# Patient Record
Sex: Male | Born: 1945 | Race: White | Hispanic: No | Marital: Single | Smoking: Never smoker
Health system: Southern US, Community
[De-identification: ages and names within clinical notes are randomized; demographics above are authoritative.]

## PROBLEM LIST (undated history)

## (undated) DIAGNOSIS — J811 Chronic pulmonary edema: Secondary | ICD-10-CM

## (undated) DIAGNOSIS — E876 Hypokalemia: Secondary | ICD-10-CM

## (undated) DIAGNOSIS — Z86718 Personal history of other venous thrombosis and embolism: Secondary | ICD-10-CM

## (undated) DIAGNOSIS — S62102A Fracture of unspecified carpal bone, left wrist, initial encounter for closed fracture: Secondary | ICD-10-CM

## (undated) DIAGNOSIS — F1010 Alcohol abuse, uncomplicated: Secondary | ICD-10-CM

## (undated) DIAGNOSIS — J849 Interstitial pulmonary disease, unspecified: Secondary | ICD-10-CM

## (undated) HISTORY — PX: ANKLE FRACTURE SURGERY: SHX122

## (undated) HISTORY — PX: OTHER SURGICAL HISTORY: SHX169

---

## 2005-10-01 ENCOUNTER — Encounter: Admission: RE | Admit: 2005-10-01 | Discharge: 2005-10-01 | Payer: Self-pay | Admitting: Family Medicine

## 2010-06-25 ENCOUNTER — Encounter: Payer: Self-pay | Admitting: Family Medicine

## 2013-03-10 ENCOUNTER — Encounter (HOSPITAL_COMMUNITY): Payer: Self-pay

## 2013-03-10 ENCOUNTER — Emergency Department (HOSPITAL_COMMUNITY): Payer: Medicare Other

## 2013-03-10 ENCOUNTER — Inpatient Hospital Stay (HOSPITAL_COMMUNITY)
Admission: EM | Admit: 2013-03-10 | Discharge: 2013-03-14 | DRG: 299 | Disposition: A | Discharge status: Home Health | Payer: Medicare Other | Attending: Internal Medicine | Admitting: Internal Medicine

## 2013-03-10 DIAGNOSIS — I2699 Other pulmonary embolism without acute cor pulmonale: Secondary | ICD-10-CM

## 2013-03-10 DIAGNOSIS — O2230 Deep phlebothrombosis in pregnancy, unspecified trimester: Secondary | ICD-10-CM | POA: Insufficient documentation

## 2013-03-10 DIAGNOSIS — I2692 Saddle embolus of pulmonary artery without acute cor pulmonale: Secondary | ICD-10-CM | POA: Diagnosis present

## 2013-03-10 DIAGNOSIS — I824Y9 Acute embolism and thrombosis of unspecified deep veins of unspecified proximal lower extremity: Principal | ICD-10-CM | POA: Diagnosis present

## 2013-03-10 DIAGNOSIS — E876 Hypokalemia: Secondary | ICD-10-CM | POA: Diagnosis not present

## 2013-03-10 DIAGNOSIS — I82401 Acute embolism and thrombosis of unspecified deep veins of right lower extremity: Secondary | ICD-10-CM

## 2013-03-10 DIAGNOSIS — R0602 Shortness of breath: Secondary | ICD-10-CM

## 2013-03-10 DIAGNOSIS — Z86718 Personal history of other venous thrombosis and embolism: Secondary | ICD-10-CM

## 2013-03-10 HISTORY — DX: Personal history of other venous thrombosis and embolism: Z86.718

## 2013-03-10 LAB — POCT I-STAT TROPONIN I: Troponin i, poc: 0.01 ng/mL (ref 0.00–0.08)

## 2013-03-10 LAB — CBC
HCT: 51.2 % (ref 39.0–52.0)
Hemoglobin: 18.3 g/dL — ABNORMAL HIGH (ref 13.0–17.0)
MCV: 107.6 fL — ABNORMAL HIGH (ref 78.0–100.0)
RBC: 4.76 MIL/uL (ref 4.22–5.81)
RDW: 17.1 % — ABNORMAL HIGH (ref 11.5–15.5)

## 2013-03-10 LAB — PROTIME-INR: INR: 1.06 (ref 0.00–1.49)

## 2013-03-10 LAB — BASIC METABOLIC PANEL
Calcium: 9.6 mg/dL (ref 8.4–10.5)
Sodium: 139 mEq/L (ref 135–145)

## 2013-03-10 LAB — PRO B NATRIURETIC PEPTIDE: Pro B Natriuretic peptide (BNP): 730.5 pg/mL — ABNORMAL HIGH (ref 0–125)

## 2013-03-10 MED ORDER — WARFARIN VIDEO
Freq: Once | Status: AC
  Administered 2013-03-11: 11:00:00

## 2013-03-10 MED ORDER — ONDANSETRON HCL 4 MG PO TABS: 4.0000 mg | ORAL_TABLET | Freq: Four times a day (QID) | ORAL | Status: DC | PRN

## 2013-03-10 MED ORDER — HEPARIN (PORCINE) IN NACL 100-0.45 UNIT/ML-% IJ SOLN
1250.0000 [IU]/h | INTRAMUSCULAR | Status: DC
  Administered 2013-03-10: 1250 [IU]/h via INTRAVENOUS
  Filled 2013-03-10: qty 250

## 2013-03-10 MED ORDER — HEPARIN BOLUS VIA INFUSION
4000.0000 [IU] | Freq: Once | INTRAVENOUS | Status: AC
  Administered 2013-03-10: 4000 [IU] via INTRAVENOUS
  Filled 2013-03-10: qty 4000

## 2013-03-10 MED ORDER — ACETAMINOPHEN 325 MG PO TABS: 650.0000 mg | ORAL_TABLET | Freq: Four times a day (QID) | ORAL | Status: DC | PRN

## 2013-03-10 MED ORDER — ACETAMINOPHEN 650 MG RE SUPP: 650.0000 mg | Freq: Four times a day (QID) | RECTAL | Status: DC | PRN

## 2013-03-10 MED ORDER — IOHEXOL 350 MG/ML SOLN
100.0000 mL | Freq: Once | INTRAVENOUS | Status: AC | PRN
  Administered 2013-03-10: 100 mL via INTRAVENOUS

## 2013-03-10 MED ORDER — WARFARIN SODIUM 7.5 MG PO TABS
7.5000 mg | ORAL_TABLET | Freq: Once | ORAL | Status: AC
  Administered 2013-03-10: 7.5 mg via ORAL
  Filled 2013-03-10: qty 1

## 2013-03-10 MED ORDER — SODIUM CHLORIDE 0.9 % IJ SOLN
3.0000 mL | Freq: Two times a day (BID) | INTRAMUSCULAR | Status: DC
  Administered 2013-03-13 – 2013-03-14 (×2): 3 mL via INTRAVENOUS

## 2013-03-10 MED ORDER — ONDANSETRON HCL 4 MG/2ML IJ SOLN: 4.0000 mg | Freq: Four times a day (QID) | INTRAMUSCULAR | Status: DC | PRN

## 2013-03-10 MED ORDER — MORPHINE SULFATE 2 MG/ML IJ SOLN: 2.0000 mg | INTRAMUSCULAR | Status: DC | PRN

## 2013-03-10 MED ORDER — COUMADIN BOOK
Freq: Once | Status: AC
  Administered 2013-03-10: 23:00:00
  Filled 2013-03-10: qty 1

## 2013-03-10 MED ORDER — WARFARIN - PHARMACIST DOSING INPATIENT: Freq: Every day | Status: DC

## 2013-03-10 NOTE — ED Notes (Signed)
Pt reports shortness of breath with increased activity x 4 days. Denies chest pain

## 2013-03-10 NOTE — ED Provider Notes (Signed)
Medical screening examination/treatment/procedure(s) were performed by non-physician practitioner and as supervising physician I was immediately available for consultation/collaboration.  Sean Whitworth T Aleathea Pugmire, MD 03/10/13 2330 

## 2013-03-10 NOTE — H&P (Signed)
[facility] History and Physical  Sean Jackson ZOX:[id number] DOB: 08-31-1945 DOA: 03/10/2013  Referring physician: Emergency department PCP: No primary provider on file.  Specialists:   Chief Complaint: SOB  HPI: Sean Jackson is a 66 y.o. male  With no significant past medical history who presents to the ED with complaints of several days of gradually worsening SOB and RLE swelling. In the Ed, the patient was found to have a RLE DVT with a large saddle PE with evidence of R heart strain. The patient was started on therapeutic anticoagulation and the hospitalist was consulted for admission. No recent surgeries. No recent travel. On further questioning, pt states he "pulled my muscle" in his leg and was therefore chairbound for the past month. Also reports a prior hx of LLE DVT about 5 years ago.  Review of Systems:  Per above. Remainder of 10pt ros reviewed and are neg  History reviewed. No pertinent past medical history. History reviewed. No pertinent past surgical history. Social History:  reports that he has never smoked. He does not have any smokeless tobacco history on file. He reports that  drinks alcohol. He reports that he does not use illicit drugs.  where does patient live--home, ALF, SNF? and with whom if at home?  Can patient participate in ADLs?  No Known Allergies  History reviewed. No pertinent family history.  (be sure to complete)  Prior to Admission medications   Medication Sig Start Date End Date Taking? Authorizing Provider  Multiple Vitamin (MULTIVITAMIN WITH MINERALS) TABS tablet Take 1 tablet by mouth daily.   Yes Historical Provider, MD   Physical Exam: Filed Vitals:   03/10/13 1733 03/10/13 1904 03/10/13 1907 03/10/13 1926  BP: 135/82 141/77    Pulse: 118 94    Temp: 98.2 F (36.8 C)  98.6 F (37 C)   TempSrc:   Oral   Resp: 22 16 21    Height:   5\' 5"  (1.651 m) 5\' 5"  (1.651 m)  Weight:   72.576 kg (160 lb) 72.576 kg (160 lb)  SpO2: 96%  93% 91%      General:  Awake, in nad  Eyes: PERRL B  ENT: Membranes moist, dentition fair  Neck: trachea midline, neck supple  Cardiovascular: regular, s1, s2  Respiratory: normal resp effort, no wheezing  Abdomen: soft, nondistended  Skin: normal skin turgor, no abnormal skin lesions seen  Musculoskeletal:perfused, no clubbing  Psychiatric: mood/affect normal // no auditory/visual hallucinations  Neurologic: cn2-12 grossly intact, strength/sensation intact  Labs on Admission:  Basic Metabolic Panel:  Recent Labs Lab 03/10/13 1900  NA 139  K 3.5  CL 101  CO2 21  GLUCOSE 142*  BUN 5*  CREATININE 0.79  CALCIUM 9.6   Liver Function Tests: No results found for this basename: AST, ALT, ALKPHOS, BILITOT, PROT, ALBUMIN,  in the last 168 hours No results found for this basename: LIPASE, AMYLASE,  in the last 168 hours No results found for this basename: AMMONIA,  in the last 168 hours CBC:  Recent Labs Lab 03/10/13 1900  WBC 9.9  HGB 18.3*  HCT 51.2  MCV 107.6*  PLT 198   Cardiac Enzymes: No results found for this basename: CKTOTAL, CKMB, CKMBINDEX, TROPONINI,  in the last 168 hours  BNP (last 3 results)  Recent Labs  03/10/13 1900  PROBNP 730.5*   CBG: No results found for this basename: GLUCAP,  in the last 168 hours  Radiological Exams on Admission: Dg Chest 2 View  03/10/2013  CLINICAL DATA:  Shortness of breath.  EXAM: CHEST  2 VIEW  COMPARISON:  9  FINDINGS: Normal cardiac contours. Mild fullness of the right hilum. Elevation of the right hemidiaphragm. Right greater than left biapical emphysematous change. Minimal linear opacities within the left lower lung. No definite pleural effusion or pneumothorax. Regional skeleton is unremarkable.  IMPRESSION: 1. Minimal linear opacities within the left lung base may represent atelectasis, scarring or infection. Followup radiography is recommended to ensure resolution or stability as findings are of  uncertain chronicity. 2. Fullness of the right hilum likely secondary to prominent right pulmonary artery. Attention on short term followup radiography.   Electronically Signed   By: Annia Belt M.D.   On: 03/10/2013 18:11   Ct Angio Chest Pe W/cm &/or Wo Cm  03/10/2013   *RADIOLOGY REPORT*  Clinical Data: Shortness of breath, evaluate for pulmonary embolism  CT ANGIOGRAPHY CHEST  Technique:  Multidetector CT imaging of the chest using the standard protocol during bolus administration of intravenous contrast. Multiplanar reconstructed images including MIPs were obtained and reviewed to evaluate the vascular anatomy.  Contrast: OMNIPAQUE IOHEXOL 350 MG/ML SOLN  Comparison: Chest radiograph - earlier same day  Vascular Findings:  There is adequate opacification of the pulmonary arterial system with the main pulmonary artery measuring 406 HU.  There is a saddle embolism within the bifurcation of the main pulmonary artery extending into the bilateral main pulmonary arteries, right greater than left.  There is near occlusive embolism extending throughout the segmental branches of the right upper lobe.  There is scattered nonocclusive embolism within the right middle and lower segmental pulmonary arteries.  There is nonocclusive embolism within the left upper and lower lobes.  The overall clot burden is deemed large.  This finding is associated with bowing of the interventricular septum with enlargement of the right ventricle with associated right to left ventricular ratio of greater than 1.  Normal caliber of the main pulmonary artery.  There is no reflux of injected contrast into the intrahepatic venous system.  Normal heart size.  Coronary calcifications.  No pericardial effusion.  Normal caliber thoracic aorta.  No definite thoracic aortic dissection or periaortic stranding.  Conventional configuration of the aortic arch.  ---------------------------------------------------  Nonvascular findings:  There is  minimal dependent volume loss within the bilateral lower lobes, right greater than left.  There is a geographic approximately 1.1 x 0.8 cm subpleural nodular opacity within the superior segment left lower lobe (image 42, series seven).  There is geographic ground-glass atelectasis within the subpleural aspect of the right upper lobe (image 35, series seven).  No pleural effusion.  No pneumothorax.  The central pulmonary airways are patent.  No mediastinal, hilar or axillary lymphadenopathy.  Limited early arterial phase evaluation of the upper abdomen is negative.  No acute or aggressive osseous abnormalities.  Stigmata of DISH within the thoracic spine.  IMPRESSION: 1.  Examination is positive for large volume saddle pulmonary embolism with associated findings suggestive of right-sided heart strain indicative of at least submassive pulmonary embolism.  2.  Scattered bilateral subpleural ground-glass opacities with dominant approximately 1.1 cm slightly nodular appearing opacity within the superior segment of the left lower lobe. These findings are nonspecific finding in the setting of described pulmonary embolism, though as a discrete underlying pulmonary nodule is not excluded, a follow-up chest CT in 4 to 6 weeks after resolution of symptoms is recommended.  Critical Value/emergent results were called by telephone at the time of interpretation on  03/10/2013 at 21 14 to [place], [place], who verbally acknowledged these results.   Original Report Authenticated By: Tacey Ruiz, MD    Assessment/Plan Principal Problem:   Saddle pulmonary embolus Active Problems:   DVT (deep vein thrombosis) in pregnancy   1. Saddle PE/DVT 1. Will admit pt to stepdown 2. Will continue patient with therapeutic anticoagulation 3. Given hx of recurrent DVT, will also check hypercoag panel 4. Cont O2 as needed  Code Status: Full (must indicate code status--if unknown or must be presumed, indicate so) Family Communication: Pt  in room (indicate person spoken with, if applicable, with phone number if by telephone) Disposition Plan: Pending (indicate anticipated LOS)  Time spent:  Harmonii Karle K [facility] Pager [phone]  If 7PM-7AM, please contact night-coverage [email or web address] Password [facility] 03/10/2013, 8:56 PM

## 2013-03-10 NOTE — ED Notes (Signed)
Unsuccessfully attempted to obtain blood x2. RN made aware 

## 2013-03-10 NOTE — ED Provider Notes (Signed)
CSN: [id number]     Arrival date & time 03/10/13  1725 History   First MD Initiated Contact with Patient 03/10/13 1727     Chief Complaint  Patient presents with  . Shortness of Breath    4 day hx of shortness of breath with increased activity, not at rest   (Consider location/radiation/quality/duration/timing/severity/associated sxs/prior Treatment) HPI Comments: 67 year old male with no known past medical history presents to the emergency department complaining of shortness of breath x3-4 days. Patient states he has been feeling short of breath on exertion, even with light activity such as walking to the kitchen making a sandwich. No shortness of breath at rest. Denies any history of the same. States he has had a slight dry cough, however this is only present once every few hours. Denies chest pain, fever, chills, recent illness, nausea, vomiting, diaphoresis, abdominal pain. Also states his right lower leg has been swelling over the past 4 days and has associated pain with walking. He has not tried any alleviating factors for any of his symptoms. He does not take medications for anything a regular basis. He is a nonsmoker. No recent long travel.  Patient is a 67 y.o. male presenting with shortness of breath. The history is provided by the patient.  Shortness of Breath Associated symptoms: cough   Associated symptoms: no chest pain, no fever, no vomiting and no wheezing     No past medical history on file. No past surgical history on file. No family history on file. History  Substance Use Topics  . Smoking status: Not on file  . Smokeless tobacco: Not on file  . Alcohol Use: Not on file    Review of Systems  Constitutional: Negative for fever and chills.  HENT: Negative for congestion.   Respiratory: Positive for cough and shortness of breath. Negative for chest tightness and wheezing.   Cardiovascular: Positive for leg swelling (unilateral, right). Negative for chest pain.   Gastrointestinal: Negative for nausea and vomiting.  Genitourinary: Negative for urgency, frequency and difficulty urinating.  Musculoskeletal: Negative for back pain.  Skin: Negative for color change.  Neurological: Negative for dizziness, syncope, weakness and light-headedness.  Psychiatric/Behavioral: Negative for confusion.  All other systems reviewed and are negative.    Allergies  Review of patient's allergies indicates not on file.  Home Medications  No current outpatient prescriptions on file. BP 135/82  Pulse 118  Temp(Src) 98.2 F (36.8 C)  Resp 22  SpO2 96% Physical Exam  Nursing note and vitals reviewed. Constitutional: He is oriented to person, place, and time. He appears well-developed and well-nourished. No distress.  HENT:  Head: Normocephalic and atraumatic.  Mouth/Throat: Oropharynx is clear and moist.  Eyes: Conjunctivae and EOM are normal. Pupils are equal, round, and reactive to light.  Neck: Normal range of motion. Neck supple. No JVD present.  Cardiovascular: Regular rhythm, normal heart sounds and intact distal pulses.  Tachycardia present.   Non-pitting edema RLE, slightly larger than LLE. Tender posterior calf. Intact distal pulses.  Pulmonary/Chest: Breath sounds normal. Tachypnea noted. He has no decreased breath sounds. He has no wheezes. He has no rhonchi. He has no rales.  Labored breathing.  Musculoskeletal: Normal range of motion.  Neurological: He is alert and oriented to person, place, and time.  Skin: Skin is warm and dry. He is not diaphoretic.  Scaly flaky skin bilateral lower extremities.  Psychiatric: He has a normal mood and affect. His behavior is normal.    ED Course  Procedures (  including critical care time) Labs Review Labs Reviewed  CBC - Abnormal; Notable for the following:    Hemoglobin 18.3 (*)    MCV 107.6 (*)    MCH 38.4 (*)    RDW 17.1 (*)    All other components within normal limits  BASIC METABOLIC PANEL -  Abnormal; Notable for the following:    Glucose, Bld 142 (*)    BUN 5 (*)    All other components within normal limits  PRO B NATRIURETIC PEPTIDE - Abnormal; Notable for the following:    Pro B Natriuretic peptide (BNP) 730.5 (*)    All other components within normal limits  PROTIME-INR  HEPARIN LEVEL (UNFRACTIONATED)  CBC  POCT I-STAT TROPONIN I   Imaging Review Dg Chest 2 View  03/10/2013   CLINICAL DATA:  Shortness of breath.  EXAM: CHEST  2 VIEW  COMPARISON:  9  FINDINGS: Normal cardiac contours. Mild fullness of the right hilum. Elevation of the right hemidiaphragm. Right greater than left biapical emphysematous change. Minimal linear opacities within the left lower lung. No definite pleural effusion or pneumothorax. Regional skeleton is unremarkable.  IMPRESSION: 1. Minimal linear opacities within the left lung base may represent atelectasis, scarring or infection. Followup radiography is recommended to ensure resolution or stability as findings are of uncertain chronicity. 2. Fullness of the right hilum likely secondary to prominent right pulmonary artery. Attention on short term followup radiography.   Electronically Signed   By: Annia Belt M.D.   On: 03/10/2013 18:11   Ct Angio Chest Pe W/cm &/or Wo Cm  03/10/2013   *RADIOLOGY REPORT*  Clinical Data: Shortness of breath, evaluate for pulmonary embolism  CT ANGIOGRAPHY CHEST  Technique:  Multidetector CT imaging of the chest using the standard protocol during bolus administration of intravenous contrast. Multiplanar reconstructed images including MIPs were obtained and reviewed to evaluate the vascular anatomy.  Contrast: OMNIPAQUE IOHEXOL 350 MG/ML SOLN  Comparison: Chest radiograph - earlier same day  Vascular Findings:  There is adequate opacification of the pulmonary arterial system with the main pulmonary artery measuring 406 HU.  There is a saddle embolism within the bifurcation of the main pulmonary artery extending into the  bilateral main pulmonary arteries, right greater than left.  There is near occlusive embolism extending throughout the segmental branches of the right upper lobe.  There is scattered nonocclusive embolism within the right middle and lower segmental pulmonary arteries.  There is nonocclusive embolism within the left upper and lower lobes.  The overall clot burden is deemed large.  This finding is associated with bowing of the interventricular septum with enlargement of the right ventricle with associated right to left ventricular ratio of greater than 1.  Normal caliber of the main pulmonary artery.  There is no reflux of injected contrast into the intrahepatic venous system.  Normal heart size.  Coronary calcifications.  No pericardial effusion.  Normal caliber thoracic aorta.  No definite thoracic aortic dissection or periaortic stranding.  Conventional configuration of the aortic arch.  ---------------------------------------------------  Nonvascular findings:  There is minimal dependent volume loss within the bilateral lower lobes, right greater than left.  There is a geographic approximately 1.1 x 0.8 cm subpleural nodular opacity within the superior segment left lower lobe (image 42, series seven).  There is geographic ground-glass atelectasis within the subpleural aspect of the right upper lobe (image 35, series seven).  No pleural effusion.  No pneumothorax.  The central pulmonary airways are patent.  No mediastinal,  hilar or axillary lymphadenopathy.  Limited early arterial phase evaluation of the upper abdomen is negative.  No acute or aggressive osseous abnormalities.  Stigmata of DISH within the thoracic spine.  IMPRESSION: 1.  Examination is positive for large volume saddle pulmonary embolism with associated findings suggestive of right-sided heart strain indicative of at least submassive pulmonary embolism.  2.  Scattered bilateral subpleural ground-glass opacities with dominant approximately 1.1 cm  slightly nodular appearing opacity within the superior segment of the left lower lobe. These findings are nonspecific finding in the setting of described pulmonary embolism, though as a discrete underlying pulmonary nodule is not excluded, a follow-up chest CT in 4 to 6 weeks after resolution of symptoms is recommended.  Critical Value/emergent results were called by telephone at the time of interpretation on 03/10/2013 at 21 14 to [place], [place], who verbally acknowledged these results.   Original Report Authenticated By: Tacey Ruiz, MD    MDM   1. Pulmonary emboli   2. Saddle embolism of pulmonary artery     CRITICAL CARE Performed by: Johnnette Gourd   Total critical care time: 1 hour  Critical care time was exclusive of separately billable procedures and treating other patients.  Critical care was necessary to treat or prevent imminent or life-threatening deterioration.  Critical care was time spent personally by me on the following activities: development of treatment plan with patient and/or surrogate as well as nursing, discussions with consultants, evaluation of patient's response to treatment, examination of patient, obtaining history from patient or surrogate, ordering and performing treatments and interventions, ordering and review of laboratory studies, ordering and review of radiographic studies, pulse oximetry and re-evaluation of patient's condition.  Patient with shortness of breath on exertion, unilateral leg swelling. He appears in no apparent distress. He is tachycardic and tachypneic. O2 sat 96% on room air. Given unilateral leg swelling, tachycardia and age I cannot r/o PE, will obtain d-dimer, LE duplex US. Other labs/imaging pending- CBC, BMP, BNP, troponin, CXR, EKG. 6:36 PM Lower extremity duplex positive for DVT throughout right leg. At this time I will cancel d-dimer and obtain CT angio of chest to evaluate for PE. 8:52 PM CTA with results as shown above. I spoke with  Dr. Delford Field with critical care who states he feels patient is stable enough for stat down unit, admit to hospitalist. Patient is hemodynamically stable. Admission accepted by Dr. Rhona Leavens, [facility]. Heparin drip started. Patient also evaluated by my attending Dr. Freida Busman who agrees with plan of care.  Trevor Mace, PA-C 03/10/13 2054

## 2013-03-10 NOTE — Progress Notes (Addendum)
ANTICOAGULATION CONSULT NOTE - Initial Consult  Pharmacy Consult for Heparin/Warfarin Indication: DVT/PE  No Known Allergies  Patient Measurements: Height: 5\' 5"  (165.1 cm) Weight: 160 lb (72.576 kg) IBW/kg (Calculated) : 61.5 Heparin Dosing Weight: 72 kg  Vital Signs: Temp: 98.6 F (37 C) (10/07 1907) Temp src: Oral (10/07 1907) BP: 141/77 mmHg (10/07 1904) Pulse Rate: 94 (10/07 1904)  Labs:  Recent Labs  03/10/13 1900  HGB 18.3*  HCT 51.2  PLT 198  LABPROT 13.6  INR 1.06  CREATININE 0.79    Estimated Creatinine Clearance: 76.9 ml/min (by C-G formula based on Cr of 0.79).   Medical History: History reviewed. No pertinent past medical history.  Medications:  Scheduled:   Infusions:  . heparin     Followed by  . heparin      Assessment:  67 year old male with no known PMH.  Comes to ED with complaint of shortness of breath and right lower leg swelling x 4 days.  Dopplers confirm + right lower leg DVT  CTAngio = PE  IV heparin and warfarin ordered per pharmacy  Goal of Therapy:  Heparin level 0.3-0.7 units/ml Monitor platelets by anticoagulation protocol: Yes   Plan:   Heparin 4000 unit IV bolus x 1 followed by infusion @ 1250 units/hr  Check heparin level in 6 hrs  Check daily heparin level & CBC while on heparin  Warfarin 7.5mg  po x 1 tonight  Check daily PT/INR  Warfarin education (book and video)  Vetra Shinall, Joselyn Glassman, PharmD 03/10/2013,8:25 PM

## 2013-03-10 NOTE — Progress Notes (Signed)
Bilateral lower extremity venous duplex completed.  Right:  DVT noted in the peroneal, posterior tibial, popliteal, femoral, profunda, common femoral veins, and saphenofemoral junction.  No evidence of superficial thrombosis.  No Baker's cyst.  Left:  No evidence of DVT, superficial thrombosis, or Baker's cyst.

## 2013-03-10 NOTE — ED Provider Notes (Signed)
Medical screening examination/treatment/procedure(s) were conducted as a shared visit with non-physician practitioner(s) and myself.  I personally evaluated the patient during the encounter  Patient pulmonary embolism on chest CT. Heparin to be given by pharmacy and patient will be admitted. He is hemodynamically stable at this time  Toy Baker, MD 03/10/13 2024

## 2013-03-10 NOTE — Progress Notes (Signed)
Patient confirms his pcp is located at [facility] at [facility]  System updated.

## 2013-03-10 NOTE — ED Notes (Signed)
MD at bedside. AWARE OF DVT TO LEGS

## 2013-03-11 DIAGNOSIS — I2692 Saddle embolus of pulmonary artery without acute cor pulmonale: Secondary | ICD-10-CM

## 2013-03-11 DIAGNOSIS — I82409 Acute embolism and thrombosis of unspecified deep veins of unspecified lower extremity: Secondary | ICD-10-CM

## 2013-03-11 DIAGNOSIS — I517 Cardiomegaly: Secondary | ICD-10-CM

## 2013-03-11 LAB — LUPUS ANTICOAGULANT PANEL
DRVVT: 33.6 secs (ref <42.9)
Lupus Anticoagulant: NOT DETECTED
PTTLA 4:1 Mix: 172.4 secs — ABNORMAL HIGH (ref 28.0–43.0)
PTTLA Confirmation: 7.8 secs (ref <8.0)

## 2013-03-11 LAB — HEPARIN LEVEL (UNFRACTIONATED)
Heparin Unfractionated: 0.95 IU/mL — ABNORMAL HIGH (ref 0.30–0.70)
Heparin Unfractionated: 0.41 IU/mL (ref 0.30–0.70)

## 2013-03-11 LAB — CBC
Hemoglobin: 16.2 g/dL (ref 13.0–17.0)
MCH: 37.9 pg — ABNORMAL HIGH (ref 26.0–34.0)
MCHC: 35 g/dL (ref 30.0–36.0)
MCV: 108.4 fL — ABNORMAL HIGH (ref 78.0–100.0)
Platelets: 185 10*3/uL (ref 150–400)
RDW: 17.5 % — ABNORMAL HIGH (ref 11.5–15.5)

## 2013-03-11 LAB — BASIC METABOLIC PANEL
Calcium: 8.8 mg/dL (ref 8.4–10.5)
Creatinine, Ser: 0.77 mg/dL (ref 0.50–1.35)
GFR calc Af Amer: >90 mL/min (ref >90)
GFR calc non Af Amer: >90 mL/min (ref >90)
Sodium: 138 mEq/L (ref 135–145)

## 2013-03-11 LAB — PROTHROMBIN GENE MUTATION

## 2013-03-11 LAB — MRSA PCR SCREENING: MRSA by PCR: NEGATIVE

## 2013-03-11 LAB — CARDIOLIPIN ANTIBODIES, IGG, IGM, IGA
Anticardiolipin IgA: 9 APL U/mL — ABNORMAL LOW (ref <22)
Anticardiolipin IgG: 7 GPL U/mL — ABNORMAL LOW (ref <23)
Anticardiolipin IgM: 0 MPL U/mL — ABNORMAL LOW (ref <11)

## 2013-03-11 LAB — ANTITHROMBIN III: AntiThromb III Func: 81 % (ref 75–120)

## 2013-03-11 LAB — PROTEIN S, TOTAL: Protein S Ag, Total: 133 % (ref 60–150)

## 2013-03-11 MED ORDER — WARFARIN SODIUM 7.5 MG PO TABS
7.5000 mg | ORAL_TABLET | Freq: Once | ORAL | Status: AC
  Administered 2013-03-11: 7.5 mg via ORAL
  Filled 2013-03-11: qty 1

## 2013-03-11 MED ORDER — SODIUM CHLORIDE 0.9 % IV SOLN
INTRAVENOUS | Status: DC
  Administered 2013-03-11: 06:00:00 via INTRAVENOUS

## 2013-03-11 MED ORDER — HEPARIN (PORCINE) IN NACL 100-0.45 UNIT/ML-% IJ SOLN
1150.0000 [IU]/h | INTRAMUSCULAR | Status: DC
  Administered 2013-03-12: 1150 [IU]/h via INTRAVENOUS
  Filled 2013-03-11 (×2): qty 250

## 2013-03-11 MED ORDER — HEPARIN (PORCINE) IN NACL 100-0.45 UNIT/ML-% IJ SOLN
1050.0000 [IU]/h | INTRAMUSCULAR | Status: DC
  Administered 2013-03-11: 1050 [IU]/h via INTRAVENOUS
  Filled 2013-03-11 (×3): qty 250

## 2013-03-11 NOTE — Consult Note (Signed)
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 CT angio revealed saddle PE with Rt heart strain and rt le with dvt per [place]. Started on heparin and moved to SDU. PCCM asked to evaluate. Nodule on ct. Note he had DVT on right this admit and ll ext dvt 5 years ago.  PAST MEDICAL HISTORY :  Past Medical History  Diagnosis Date  . History of blood clots    History reviewed. No pertinent past surgical history. Prior to Admission medications   Medication Sig Start Date End Date Taking? Authorizing Provider  Multiple Vitamin (MULTIVITAMIN WITH MINERALS) TABS tablet Take 1 tablet by mouth daily.   Yes Historical Provider, MD   No Known Allergies  FAMILY HISTORY:  History reviewed. No pertinent family history. SOCIAL HISTORY:  reports that he has never smoked. He does not have any smokeless tobacco history on file. He reports that he drinks alcohol. He reports that he does not use illicit drugs.  REVIEW OF SYSTEMS:   10 point review of system taken, please see HPI for positives and negatives.   SUBJECTIVE:  No complaints  VITAL SIGNS: Temp:  [98.2 F (36.8 C)-98.9 F (37.2 C)] 98.3 F (36.8 C) (10/08 0400) Pulse Rate:  [67-118] 70 (10/08 0800) Resp:  [12-22] 18 (10/08 0800) BP: (108-141)/(67-82) 130/72 mmHg (10/08 0800) SpO2:  [91 %-100 %] 97 % (10/08  0800) Weight:  [152 lb 5.4 oz (69.1 kg)-160 lb (72.576 kg)] 152 lb 5.4 oz (69.1 kg) (10/07 2211) HEMODYNAMICS:   VENTILATOR SETTINGS:   INTAKE / OUTPUT: Intake/Output     10/07 0701 - 10/08 0700 10/08 0701 - 10/09 0700   P.O. 720    I.V. (mL/kg) 213 (3.1) 20.5 (0.3)   Total Intake(mL/kg) 933 (13.5) 20.5 (0.3)   Urine (mL/kg/hr) 125    Total Output 125     Net +808 +20.5          PHYSICAL EXAMINATION: General:  [id number] Neuro:  Intact HEENT:  No lvd/LAN Cardiovascular:  HSR RRR Lungs:  CTA Abdomen:  +bs, soft Musculoskeletal:  Intact, no injuries to lower ext noted Skin:  warm  LABS:  CBC Recent Labs     03/10/13  1900  03/11/13  0335  WBC  9.9  8.6  HGB  18.3*  16.2  HCT  51.2  46.3  PLT  198  185   Coag's Recent Labs     03/10/13  1900  INR  1.06   BMET Recent Labs     03/10/13  1900  03/11/13  0335  NA  139  138  K  3.5  3.6  CL  101  102  CO2  21  25  BUN  5*  5*  CREATININE  0.79  0.77  GLUCOSE  142*  131*   Electrolytes Recent Labs  03/10/13  1900  03/11/13  0335  CALCIUM  9.6  8.8   Sepsis Markers No results found for this basename: LACTICACIDVEN, PROCALCITON, O2SATVEN,  in the last 72 hours ABG No results found for this basename: PHART, PCO2ART, PO2ART,  in the last 72 hours Liver Enzymes No results found for this basename: AST, ALT, ALKPHOS, BILITOT, ALBUMIN,  in the last 72 hours Cardiac Enzymes Recent Labs     03/10/13  1900  PROBNP  730.5*   Glucose No results found for this basename: GLUCAP,  in the last 72 hours  Imaging Dg Chest 2 View  03/10/2013   CLINICAL DATA:  Shortness of breath.  EXAM: CHEST  2 VIEW  COMPARISON:  9  FINDINGS: Normal cardiac contours. Mild fullness of the right hilum. Elevation of the right hemidiaphragm. Right greater than left biapical emphysematous change. Minimal linear opacities within the left lower lung. No definite pleural effusion or pneumothorax. Regional skeleton is unremarkable.   IMPRESSION: 1. Minimal linear opacities within the left lung base may represent atelectasis, scarring or infection. Followup radiography is recommended to ensure resolution or stability as findings are of uncertain chronicity. 2. Fullness of the right hilum likely secondary to prominent right pulmonary artery. Attention on short term followup radiography.   Electronically Signed   By: Annia Belt M.D.   On: 03/10/2013 18:11   Ct Angio Chest Pe W/cm &/or Wo Cm  03/10/2013   *RADIOLOGY REPORT*  Clinical Data: Shortness of breath, evaluate for pulmonary embolism  CT ANGIOGRAPHY CHEST  Technique:  Multidetector CT imaging of the chest using the standard protocol during bolus administration of intravenous contrast. Multiplanar reconstructed images including MIPs were obtained and reviewed to evaluate the vascular anatomy.  Contrast: OMNIPAQUE IOHEXOL 350 MG/ML SOLN  Comparison: Chest radiograph - earlier same day  Vascular Findings:  There is adequate opacification of the pulmonary arterial system with the main pulmonary artery measuring 406 HU.  There is a saddle embolism within the bifurcation of the main pulmonary artery extending into the bilateral main pulmonary arteries, right greater than left.  There is near occlusive embolism extending throughout the segmental branches of the right upper lobe.  There is scattered nonocclusive embolism within the right middle and lower segmental pulmonary arteries.  There is nonocclusive embolism within the left upper and lower lobes.  The overall clot burden is deemed large.  This finding is associated with bowing of the interventricular septum with enlargement of the right ventricle with associated right to left ventricular ratio of greater than 1.  Normal caliber of the main pulmonary artery.  There is no reflux of injected contrast into the intrahepatic venous system.  Normal heart size.  Coronary calcifications.  No pericardial effusion.  Normal caliber thoracic  aorta.  No definite thoracic aortic dissection or periaortic stranding.  Conventional configuration of the aortic arch.  ---------------------------------------------------  Nonvascular findings:  There is minimal dependent volume loss within the bilateral lower lobes, right greater than left.  There is a geographic approximately 1.1 x 0.8 cm subpleural nodular opacity within the superior segment left lower lobe (image 42, series seven).  There is geographic ground-glass atelectasis within the subpleural aspect of the right upper lobe (image 35, series seven).  No pleural effusion.  No pneumothorax.  The central pulmonary airways are patent.  No mediastinal, hilar or axillary lymphadenopathy.  Limited early arterial phase evaluation of the upper abdomen is negative.  No acute or aggressive osseous abnormalities.  Stigmata of DISH within  the thoracic spine.  IMPRESSION: 1.  Examination is positive for large volume saddle pulmonary embolism with associated findings suggestive of right-sided heart strain indicative of at least submassive pulmonary embolism.  2.  Scattered bilateral subpleural ground-glass opacities with dominant approximately 1.1 cm slightly nodular appearing opacity within the superior segment of the left lower lobe. These findings are nonspecific finding in the setting of described pulmonary embolism, though as a discrete underlying pulmonary nodule is not excluded, a follow-up chest CT in 4 to 6 weeks after resolution of symptoms is recommended.  Critical Value/emergent results were called by telephone at the time of interpretation on 03/10/2013 at 21 14 to [place], [place], who verbally acknowledged these results.   Original Report Authenticated By: Tacey Ruiz, MD    ASSESSMENT / PLAN:  PULMONARY A:Bilateral PE.   ? Nodules vs areas of infarct due to PE Rt le dvt P:   -O2 as needed -heparin gtt started -He will need lifelong anticoagulation, either xarelto or coumadin whichever he chooses  after hearing the pros/cons -hypercoag work up -follow up ct for nodules in 6 months; expect these will resolve -no role here for lytics in a hemodynamically stable patient  Levy Pupa, MD, PhD 03/11/2013, 12:55 PM [facility] [phone] or if no answer [phone]

## 2013-03-11 NOTE — Progress Notes (Signed)
ANTICOAGULATION CONSULT NOTE - Follow Up Consult  Pharmacy Consult for Heparin/couomadin Indication: pulmonary embolus and DVT  No Known Allergies  Patient Measurements: Height: 5\' 6"  (167.6 cm) Weight: 152 lb 5.4 oz (69.1 kg) IBW/kg (Calculated) : 63.8 Heparin Dosing Weight:   Vital Signs: Temp: 98.4 F (36.9 C) (10/08 1200) Temp src: Oral (10/08 1200) BP: 118/70 mmHg (10/08 1200) Pulse Rate: 71 (10/08 1200)  Labs:  Recent Labs  03/10/13 1900 03/11/13 0335 03/11/13 1253  HGB 18.3* 16.2  --   HCT 51.2 46.3  --   PLT 198 185  --   LABPROT 13.6  --  16.2*  INR 1.06  --  1.33  HEPARINUNFRC  --  0.95* 0.52  CREATININE 0.79 0.77  --     Estimated Creatinine Clearance: 79.8 ml/min (by C-G formula based on Cr of 0.77).   Medications:  Infusions:  . sodium chloride 10 mL/hr at 03/11/13 0605  . heparin 1,050 Units/hr (03/11/13 1222)    Assessment: Patient with saddle PE/DVT. History of DVT ~37yrs ago.   Heparin level: 0.52 on 1050 units/hr, level this morning elevated and rate adjusted accordingly  INR: 1.33 (baseline = 1.06)  CBC: Hgb and platelets WNL  No bleeding noted   Hypercoagulable panel: Homocysteine = 98.4 (elevated) ATIII = 81 Protein C = 60 (low) Protein S = 133 Lupus AC = pending FV leiden = pending Prothromin gene mutation = pending Cardiolipin ab: pending  Goal of Therapy:  Heparin level 0.3-0.7 units/ml Monitor platelets by anticoagulation protocol: Yes   Plan:   Continue heparin at 1050 units/hr, recheck level at 8pm to confirm therapeutic  Suggest heparin level goal 0.5-0.7 for saddle PE  Coumadin 7.5mg  po x 1   Daily INR and heparin levels  Juliette Alcide, PharmD, BCPS.   Pager: [phone]  03/11/2013,1:42 PM

## 2013-03-11 NOTE — Progress Notes (Signed)
*  PRELIMINARY RESULTS* Echocardiogram 2D Echocardiogram has been performed.  Sean Jackson 03/11/2013, 11:38 AM

## 2013-03-11 NOTE — Progress Notes (Signed)
ANTICOAGULATION CONSULT NOTE - Follow Up Consult  Pharmacy Consult for Heparin/coumadin Indication: pulmonary embolus and DVT  No Known Allergies  Patient Measurements: Height: 5\' 6"  (167.6 cm) Weight: 152 lb 5.4 oz (69.1 kg) IBW/kg (Calculated) : 63.8   Vital Signs: Temp: 98.5 F (36.9 C) (10/08 2004) Temp src: Oral (10/08 2004) BP: 133/76 mmHg (10/08 2004) Pulse Rate: 78 (10/08 2004)  Labs:  Recent Labs  03/10/13 1900 03/11/13 0335 03/11/13 1253 03/11/13 2010  HGB 18.3* 16.2  --   --   HCT 51.2 46.3  --   --   PLT 198 185  --   --   LABPROT 13.6  --  16.2*  --   INR 1.06  --  1.33  --   HEPARINUNFRC  --  0.95* 0.52 0.41  CREATININE 0.79 0.77  --   --     Estimated Creatinine Clearance: 79.8 ml/min (by C-G formula based on Cr of 0.77).   Medications:  Infusions:  . sodium chloride 10 mL/hr at 03/11/13 0605  . heparin      Assessment: Patient with saddle PE/DVT. History of DVT ~25yrs ago.   Heparin level: in therapeutic range at 0.41 on 1050 units/hr, however decreased from AM level  CBC: Hgb and platelets WNL  No bleeding or infusion line issues per RN   Hypercoagulable panel: Homocysteine = 98.4 (elevated) ATIII = 81 Protein C = 60 (low) Protein S = 133 Lupus AC = not detected FV leiden = pending Prothromin gene mutation = negative Cardiolipin ab: normal  Goal of Therapy:  Heparin level 0.3-0.7 units/ml Monitor platelets by anticoagulation protocol: Yes   Plan:   increase heparin gtt to 1150 units/hr  Suggest heparin level goal 0.5-0.7 for saddle PE  Daily heparin levels  Thank you for the consult.  Tomi Bamberger, PharmD Clinical Pharmacist Pager: [phone] Pharmacy: [phone] 03/11/2013 8:41 PM

## 2013-03-11 NOTE — Progress Notes (Signed)
CARE MANAGEMENT NOTE 03/11/2013  Patient:  Sean Jackson, Sean Jackson   Account Number:  [id number]  Date Initiated:  03/11/2013  Documentation initiated by:  DAVIS,RHONDA  Subjective/Objective Assessment:   pt with bilateral pulmonary emboli, and le dvt, started on high dose of iv heparin, has hx of same, rt heart strain present.     Action/Plan:   home when stable   Anticipated DC Date:  03/14/2013   Anticipated DC Plan:  HOME/SELF CARE  In-house referral  NA      DC Planning Services  NA      PAC Choice  NA   Choice offered to / List presented to:  NA   DME arranged  NA      DME agency  NA     HH arranged  NA      HH agency  NA   Status of service:  In process, will continue to follow Medicare Important Message given?  NA - LOS <3 / Initial given by admissions (If response is "NO", the following Medicare IM given date fields will be blank) Date Medicare IM given:   Date Additional Medicare IM given:    Discharge Disposition:    Per UR Regulation:  Reviewed for med. necessity/level of care/duration of stay  If discussed at Long Length of Stay Meetings, dates discussed:    Comments:  [id number]/Rhonda Stark Jock, BSN, [place] [phone] Chart Reviewed for discharge and hospital needs. Discharge needs at time of review:  None Review of patient progress due on [id number].

## 2013-03-11 NOTE — Progress Notes (Signed)
ANTICOAGULATION CONSULT NOTE - Follow Up Consult  Pharmacy Consult for Heparin Indication: pulmonary embolus and DVT  No Known Allergies  Patient Measurements: Height: 5\' 6"  (167.6 cm) Weight: 152 lb 5.4 oz (69.1 kg) IBW/kg (Calculated) : 63.8 Heparin Dosing Weight:   Vital Signs: Temp: 98.9 F (37.2 C) (10/07 2211) Temp src: Oral (10/07 2211) BP: 108/73 mmHg (10/08 0000) Pulse Rate: 74 (10/08 0000)  Labs:  Recent Labs  03/10/13 1900 03/11/13 0335  HGB 18.3* 16.2  HCT 51.2 46.3  PLT 198 185  LABPROT 13.6  --   INR 1.06  --   HEPARINUNFRC  --  0.95*  CREATININE 0.79 0.77    Estimated Creatinine Clearance: 79.8 ml/min (by C-G formula based on Cr of 0.77).   Medications:  Infusions:  . sodium chloride    . heparin      Assessment: Patient with PE/DVT and high heparin level.  No issues per RN.  Goal of Therapy:  Heparin level 0.3-0.7 units/ml Monitor platelets by anticoagulation protocol: Yes   Plan:  Decrease heparin to 1050 units/hr, recheck level at 1300  [address], Sean Jackson 03/11/2013,5:26 AM

## 2013-03-11 NOTE — Progress Notes (Signed)
[facility] PROGRESS NOTE  Sean Jackson JXB:[id number] DOB: 1945/10/02 DOA: 03/10/2013 PCP: Beverley Fiedler, MD  Assessment/Plan: #1  saddle pulmonary embolus Likely etiology of patient's shortness of breath. Lower extremity Dopplers consistent with a right lower extremity DVT. Patient stated had a left lower extremity DVT approximately 5 years ago was on Coumadin for that. Check 2 D echo to r/o RV strain. Patient currently on IV heparin. Patient to be started on Coumadin today. Patient will likely need anticoagulation for life says this is his second episode.  #2 right lower extremity DVT Questionable etiology. Patient denies any recent long car rides. Patient denies any recent surgeries. Patient denies any recent,. Patient denies any family history. Patient had a prior left lower extremity DVT apparently 5 years ago. Patient will likely require anticoagulation for life. Patient currently on IV heparin. Coumadin to be started today. PT/OT.  #3 prophylaxis Heparin for DVT prophylaxis.  Code Status: Full Family Communication: Updated patient no family at bedside. Disposition Plan: Remaining step down.   Consultants:  PCCM: Dr Delton Coombes  Procedures:  Lower extremity Dopplers 03/10/2013  CT Angio chest 03/10/2013  Chest x-ray 03/10/2013  Antibiotics:  None  HPI/Subjective: Patient denies any current shortness of breath at rest however states he was short of breath on exertion prior to admission. Denies any chest pain.  Objective: Filed Vitals:   03/11/13 0800  BP: 130/72  Pulse: 70  Temp: 98 F (36.7 C)  Resp: 18    Intake/Output Summary (Last 24 hours) at 03/11/13 1024 Last data filed at 03/11/13 0800  Gross per 24 hour  Intake  953.5 ml  Output    125 ml  Net  828.5 ml   Filed Weights   03/10/13 1907 03/10/13 1926 03/10/13 2211  Weight: 72.576 kg (160 lb) 72.576 kg (160 lb) 69.1 kg (152 lb 5.4 oz)    Exam:   General:  NAD  Cardiovascular:  RRR  Respiratory: CTAB  Abdomen: Soft, nontender, nondistended, positive bowel sounds  Musculoskeletal: No clubbing cyanosis or edema  Data Reviewed: Basic Metabolic Panel:  Recent Labs Lab 03/10/13 1900 03/11/13 0335  NA 139 138  K 3.5 3.6  CL 101 102  CO2 21 25  GLUCOSE 142* 131*  BUN 5* 5*  CREATININE 0.79 0.77  CALCIUM 9.6 8.8   Liver Function Tests: No results found for this basename: AST, ALT, ALKPHOS, BILITOT, PROT, ALBUMIN,  in the last 168 hours No results found for this basename: LIPASE, AMYLASE,  in the last 168 hours No results found for this basename: AMMONIA,  in the last 168 hours CBC:  Recent Labs Lab 03/10/13 1900 03/11/13 0335  WBC 9.9 8.6  HGB 18.3* 16.2  HCT 51.2 46.3  MCV 107.6* 108.4*  PLT 198 185   Cardiac Enzymes: No results found for this basename: CKTOTAL, CKMB, CKMBINDEX, TROPONINI,  in the last 168 hours BNP (last 3 results)  Recent Labs  03/10/13 1900  PROBNP 730.5*   CBG: No results found for this basename: GLUCAP,  in the last 168 hours  Recent Results (from the past 240 hour(s))  MRSA PCR SCREENING     Status: None   Collection Time    03/10/13 10:31 PM      Result Value Range Status   MRSA by PCR NEGATIVE  NEGATIVE Final   Comment:            The GeneXpert MRSA Assay (FDA     approved for NASAL specimens     only),  is one component of a     comprehensive MRSA colonization     surveillance program. It is not     intended to diagnose MRSA     infection nor to guide or     monitor treatment for     MRSA infections.     Studies: Dg Chest 2 View  03/10/2013   CLINICAL DATA:  Shortness of breath.  EXAM: CHEST  2 VIEW  COMPARISON:  9  FINDINGS: Normal cardiac contours. Mild fullness of the right hilum. Elevation of the right hemidiaphragm. Right greater than left biapical emphysematous change. Minimal linear opacities within the left lower lung. No definite pleural effusion or pneumothorax. Regional skeleton is  unremarkable.  IMPRESSION: 1. Minimal linear opacities within the left lung base may represent atelectasis, scarring or infection. Followup radiography is recommended to ensure resolution or stability as findings are of uncertain chronicity. 2. Fullness of the right hilum likely secondary to prominent right pulmonary artery. Attention on short term followup radiography.   Electronically Signed   By: Annia Belt M.D.   On: 03/10/2013 18:11   Ct Angio Chest Pe W/cm &/or Wo Cm  03/10/2013   *RADIOLOGY REPORT*  Clinical Data: Shortness of breath, evaluate for pulmonary embolism  CT ANGIOGRAPHY CHEST  Technique:  Multidetector CT imaging of the chest using the standard protocol during bolus administration of intravenous contrast. Multiplanar reconstructed images including MIPs were obtained and reviewed to evaluate the vascular anatomy.  Contrast: OMNIPAQUE IOHEXOL 350 MG/ML SOLN  Comparison: Chest radiograph - earlier same day  Vascular Findings:  There is adequate opacification of the pulmonary arterial system with the main pulmonary artery measuring 406 HU.  There is a saddle embolism within the bifurcation of the main pulmonary artery extending into the bilateral main pulmonary arteries, right greater than left.  There is near occlusive embolism extending throughout the segmental branches of the right upper lobe.  There is scattered nonocclusive embolism within the right middle and lower segmental pulmonary arteries.  There is nonocclusive embolism within the left upper and lower lobes.  The overall clot burden is deemed large.  This finding is associated with bowing of the interventricular septum with enlargement of the right ventricle with associated right to left ventricular ratio of greater than 1.  Normal caliber of the main pulmonary artery.  There is no reflux of injected contrast into the intrahepatic venous system.  Normal heart size.  Coronary calcifications.  No pericardial effusion.  Normal  caliber thoracic aorta.  No definite thoracic aortic dissection or periaortic stranding.  Conventional configuration of the aortic arch.  ---------------------------------------------------  Nonvascular findings:  There is minimal dependent volume loss within the bilateral lower lobes, right greater than left.  There is a geographic approximately 1.1 x 0.8 cm subpleural nodular opacity within the superior segment left lower lobe (image 42, series seven).  There is geographic ground-glass atelectasis within the subpleural aspect of the right upper lobe (image 35, series seven).  No pleural effusion.  No pneumothorax.  The central pulmonary airways are patent.  No mediastinal, hilar or axillary lymphadenopathy.  Limited early arterial phase evaluation of the upper abdomen is negative.  No acute or aggressive osseous abnormalities.  Stigmata of DISH within the thoracic spine.  IMPRESSION: 1.  Examination is positive for large volume saddle pulmonary embolism with associated findings suggestive of right-sided heart strain indicative of at least submassive pulmonary embolism.  2.  Scattered bilateral subpleural ground-glass opacities with dominant approximately 1.1 cm  slightly nodular appearing opacity within the superior segment of the left lower lobe. These findings are nonspecific finding in the setting of described pulmonary embolism, though as a discrete underlying pulmonary nodule is not excluded, a follow-up chest CT in 4 to 6 weeks after resolution of symptoms is recommended.  Critical Value/emergent results were called by telephone at the time of interpretation on 03/10/2013 at 21 14 to [place], [place], who verbally acknowledged these results.   Original Report Authenticated By: Tacey Ruiz, MD    Scheduled Meds: . sodium chloride  3 mL Intravenous Q12H  . warfarin   Does not apply Once  . Warfarin - Pharmacist Dosing Inpatient   Does not apply q1800   Continuous Infusions: . sodium chloride 10 mL/hr at  03/11/13 0605  . heparin 1,050 Units/hr (03/11/13 0800)    Principal Problem:   Saddle pulmonary embolus Active Problems:   Right leg DVT    Time spent: > 35 mins    [facility]  [facility] Pager [phone]. If 7PM-7AM, please contact night-coverage at [email or web address], password [facility] 03/11/2013, 10:24 AM  LOS: 1 day

## 2013-03-12 LAB — PROTIME-INR
INR: 1.89 — ABNORMAL HIGH (ref 0.00–1.49)
Prothrombin Time: 21.1 seconds — ABNORMAL HIGH (ref 11.6–15.2)

## 2013-03-12 LAB — CBC
MCH: 37.5 pg — ABNORMAL HIGH (ref 26.0–34.0)
MCHC: 34.3 g/dL (ref 30.0–36.0)
MCV: 109.5 fL — ABNORMAL HIGH (ref 78.0–100.0)
Platelets: 194 10*3/uL (ref 150–400)
RDW: 17.3 % — ABNORMAL HIGH (ref 11.5–15.5)

## 2013-03-12 LAB — BASIC METABOLIC PANEL
BUN: 5 mg/dL — ABNORMAL LOW (ref 6–23)
Calcium: 9 mg/dL (ref 8.4–10.5)
Creatinine, Ser: 0.86 mg/dL (ref 0.50–1.35)
GFR calc Af Amer: >90 mL/min (ref >90)
GFR calc non Af Amer: 87 mL/min — ABNORMAL LOW (ref >90)
Potassium: 4.9 mEq/L (ref 3.5–5.1)

## 2013-03-12 LAB — HEPARIN LEVEL (UNFRACTIONATED): Heparin Unfractionated: 0.66 IU/mL (ref 0.30–0.70)

## 2013-03-12 MED ORDER — WARFARIN SODIUM 4 MG PO TABS
4.0000 mg | ORAL_TABLET | Freq: Once | ORAL | Status: AC
  Administered 2013-03-12: 19:00:00 4 mg via ORAL
  Filled 2013-03-12 (×2): qty 1

## 2013-03-12 NOTE — Progress Notes (Signed)
[facility] PROGRESS NOTE  Sean Jackson ZOX:[id number] DOB: 01-20-1946 DOA: 03/10/2013 PCP: Beverley Fiedler, MD  Assessment/Plan: #1  saddle pulmonary embolus Likely etiology of patient's shortness of breath. Clinical improvement. Lower extremity Dopplers consistent with a right lower extremity DVT. Patient stated had a left lower extremity DVT approximately 5 years ago was on Coumadin for that. 2 D echo with EF 55 % and RV with moderately dilated cavity size and mildly to moderately reduced systolic function. Patient currently on IV heparin. Patient started on Coumadin yesterday. Patient will likely need anticoagulation for life says this is his second episode. Patient will need repeat 2 D Echo in approx 6 months to f/u on RV. May consider hypercoagulable panel in 1 year. Follow.  #2 right lower extremity DVT Questionable etiology. Patient denies any recent long car rides. Patient denies any recent surgeries. Patient denies any recent,. Patient denies any family history. Patient had a prior left lower extremity DVT apparently 5 years ago. Patient will likely require anticoagulation for life. Patient currently on IV heparin. Coumadin started yesterday. PT/OT.  #3 prophylaxis Heparin for DVT prophylaxis.  Code Status: Full Family Communication: Updated patient no family at bedside. Disposition Plan: Transfer to telemetry.   Consultants:  PCCM: Dr Delton Coombes  Procedures:  Lower extremity Dopplers 03/10/2013  CT Angio chest 03/10/2013  Chest x-ray 03/10/2013  2 d echo 03/11/13  Antibiotics:  None  HPI/Subjective: Patient denies any current shortness of breath at rest, however states he was short of breath on exertion prior to admission. Denies any chest pain.  Objective: Filed Vitals:   03/12/13 0546  BP: 118/90  Pulse: 68  Temp: 98.4 F (36.9 C)  Resp: 27    Intake/Output Summary (Last 24 hours) at 03/12/13 0842 Last data filed at 03/12/13 0600  Gross per 24 hour   Intake 1289.79 ml  Output    850 ml  Net 439.79 ml   Filed Weights   03/10/13 1907 03/10/13 1926 03/10/13 2211  Weight: 72.576 kg (160 lb) 72.576 kg (160 lb) 69.1 kg (152 lb 5.4 oz)    Exam:   General:  NAD  Cardiovascular: RRR  Respiratory: CTAB  Abdomen: Soft, nontender, nondistended, positive bowel sounds  Musculoskeletal: No clubbing cyanosis or edema  Data Reviewed: Basic Metabolic Panel:  Recent Labs Lab 03/10/13 1900 03/11/13 0335 03/12/13 0544  NA 139 138 139  K 3.5 3.6 4.9  CL 101 102 104  CO2 21 25 27   GLUCOSE 142* 131* 110*  BUN 5* 5* 5*  CREATININE 0.79 0.77 0.86  CALCIUM 9.6 8.8 9.0   Liver Function Tests: No results found for this basename: AST, ALT, ALKPHOS, BILITOT, PROT, ALBUMIN,  in the last 168 hours No results found for this basename: LIPASE, AMYLASE,  in the last 168 hours No results found for this basename: AMMONIA,  in the last 168 hours CBC:  Recent Labs Lab 03/10/13 1900 03/11/13 0335 03/12/13 0544  WBC 9.9 8.6 8.7  HGB 18.3* 16.2 15.8  HCT 51.2 46.3 46.1  MCV 107.6* 108.4* 109.5*  PLT 198 185 194   Cardiac Enzymes: No results found for this basename: CKTOTAL, CKMB, CKMBINDEX, TROPONINI,  in the last 168 hours BNP (last 3 results)  Recent Labs  03/10/13 1900  PROBNP 730.5*   CBG: No results found for this basename: GLUCAP,  in the last 168 hours  Recent Results (from the past 240 hour(s))  MRSA PCR SCREENING     Status: None   Collection Time  03/10/13 10:31 PM      Result Value Range Status   MRSA by PCR NEGATIVE  NEGATIVE Final   Comment:            The GeneXpert MRSA Assay (FDA     approved for NASAL specimens     only), is one component of a     comprehensive MRSA colonization     surveillance program. It is not     intended to diagnose MRSA     infection nor to guide or     monitor treatment for     MRSA infections.     Studies: Dg Chest 2 View  03/10/2013   CLINICAL DATA:  Shortness of breath.   EXAM: CHEST  2 VIEW  COMPARISON:  9  FINDINGS: Normal cardiac contours. Mild fullness of the right hilum. Elevation of the right hemidiaphragm. Right greater than left biapical emphysematous change. Minimal linear opacities within the left lower lung. No definite pleural effusion or pneumothorax. Regional skeleton is unremarkable.  IMPRESSION: 1. Minimal linear opacities within the left lung base may represent atelectasis, scarring or infection. Followup radiography is recommended to ensure resolution or stability as findings are of uncertain chronicity. 2. Fullness of the right hilum likely secondary to prominent right pulmonary artery. Attention on short term followup radiography.   Electronically Signed   By: Annia Belt M.D.   On: 03/10/2013 18:11   Ct Angio Chest Pe W/cm &/or Wo Cm  03/10/2013   *RADIOLOGY REPORT*  Clinical Data: Shortness of breath, evaluate for pulmonary embolism  CT ANGIOGRAPHY CHEST  Technique:  Multidetector CT imaging of the chest using the standard protocol during bolus administration of intravenous contrast. Multiplanar reconstructed images including MIPs were obtained and reviewed to evaluate the vascular anatomy.  Contrast: OMNIPAQUE IOHEXOL 350 MG/ML SOLN  Comparison: Chest radiograph - earlier same day  Vascular Findings:  There is adequate opacification of the pulmonary arterial system with the main pulmonary artery measuring 406 HU.  There is a saddle embolism within the bifurcation of the main pulmonary artery extending into the bilateral main pulmonary arteries, right greater than left.  There is near occlusive embolism extending throughout the segmental branches of the right upper lobe.  There is scattered nonocclusive embolism within the right middle and lower segmental pulmonary arteries.  There is nonocclusive embolism within the left upper and lower lobes.  The overall clot burden is deemed large.  This finding is associated with bowing of the interventricular  septum with enlargement of the right ventricle with associated right to left ventricular ratio of greater than 1.  Normal caliber of the main pulmonary artery.  There is no reflux of injected contrast into the intrahepatic venous system.  Normal heart size.  Coronary calcifications.  No pericardial effusion.  Normal caliber thoracic aorta.  No definite thoracic aortic dissection or periaortic stranding.  Conventional configuration of the aortic arch.  ---------------------------------------------------  Nonvascular findings:  There is minimal dependent volume loss within the bilateral lower lobes, right greater than left.  There is a geographic approximately 1.1 x 0.8 cm subpleural nodular opacity within the superior segment left lower lobe (image 42, series seven).  There is geographic ground-glass atelectasis within the subpleural aspect of the right upper lobe (image 35, series seven).  No pleural effusion.  No pneumothorax.  The central pulmonary airways are patent.  No mediastinal, hilar or axillary lymphadenopathy.  Limited early arterial phase evaluation of the upper abdomen is negative.  No acute  or aggressive osseous abnormalities.  Stigmata of DISH within the thoracic spine.  IMPRESSION: 1.  Examination is positive for large volume saddle pulmonary embolism with associated findings suggestive of right-sided heart strain indicative of at least submassive pulmonary embolism.  2.  Scattered bilateral subpleural ground-glass opacities with dominant approximately 1.1 cm slightly nodular appearing opacity within the superior segment of the left lower lobe. These findings are nonspecific finding in the setting of described pulmonary embolism, though as a discrete underlying pulmonary nodule is not excluded, a follow-up chest CT in 4 to 6 weeks after resolution of symptoms is recommended.  Critical Value/emergent results were called by telephone at the time of interpretation on 03/10/2013 at 21 14 to [place], [place],  who verbally acknowledged these results.   Original Report Authenticated By: Tacey Ruiz, MD    Scheduled Meds: . sodium chloride  3 mL Intravenous Q12H  . warfarin  4 mg Oral ONCE-1800  . Warfarin - Pharmacist Dosing Inpatient   Does not apply q1800   Continuous Infusions: . sodium chloride 10 mL/hr at 03/11/13 0605  . heparin 1,150 Units/hr (03/12/13 0600)    Principal Problem:   Saddle pulmonary embolus Active Problems:   Right leg DVT    Time spent: > 35 mins    [facility]  [facility] Pager [phone]. If 7PM-7AM, please contact night-coverage at [email or web address], password [facility] 03/12/2013, 8:42 AM  LOS: 2 days

## 2013-03-12 NOTE — Progress Notes (Signed)
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 CT angio revealed saddle PE with Rt heart strain and rt le with dvt per [place]. Started on heparin and moved to SDU. PCCM asked to evaluate. Nodule on ct. Note he had DVT on right this admit and ll ext dvt 5 years ago.  SUBJECTIVE:  No complaints, feels better  VITAL SIGNS: Temp:  [98 F (36.7 C)-98.5 F (36.9 C)] 98.4 F (36.9 C) (10/09 0546) Pulse Rate:  [64-79] 68 (10/09 0546) Resp:  [10-27] 27 (10/09 0546) BP: (107-133)/(64-90) 118/90 mmHg (10/09 0546) SpO2:  [95 %-99 %] 99 % (10/09 0546) HEMODYNAMICS:   VENTILATOR SETTINGS:   INTAKE / OUTPUT: Intake/Output     10/08 0701 - 10/09 0700 10/09 0701 - 10/10 0700   P.O. 820    I.V. (mL/kg) 490.3 (7.1)    Total Intake(mL/kg) 1310.3 (19)    Urine (mL/kg/hr) 850 (0.5)    Total Output 850     Net +460.3          Stool Occurrence 1 x      PHYSICAL EXAMINATION: General:  [id number] Neuro:  Intact HEENT:  No lvd/LAN Cardiovascular:  HSR RRR Lungs:  CTA Abdomen:  +bs, soft Musculoskeletal:  Intact, no injuries to lower ext noted Skin:  warm  LABS:  CBC Recent Labs     03/10/13  1900  03/11/13  0335  03/12/13  0544  WBC  9.9  8.6  8.7  HGB  18.3*  16.2  15.8  HCT  51.2  46.3  46.1  PLT  198  185  194   Coag's Recent Labs     03/10/13   1900  03/11/13  1253  03/12/13  0544  INR  1.06  1.33  1.89*   BMET Recent Labs     03/10/13  1900  03/11/13  0335  03/12/13  0544  NA  139  138  139  K  3.5  3.6  4.9  CL  101  102  104  CO2  21  25  27   BUN  5*  5*  5*  CREATININE  0.79  0.77  0.86  GLUCOSE  142*  131*  110*   Electrolytes Recent Labs     03/10/13  1900  03/11/13  0335  03/12/13  0544  CALCIUM  9.6  8.8  9.0   Sepsis Markers No results found for this basename: LACTICACIDVEN, PROCALCITON, O2SATVEN,  in the last 72 hours ABG No results found for this basename: PHART, PCO2ART, PO2ART,  in the last 72 hours Liver Enzymes No results found for this basename: AST, ALT, ALKPHOS, BILITOT, ALBUMIN,  in the last 72 hours Cardiac Enzymes Recent Labs     03/10/13  1900  PROBNP  730.5*   Glucose No results found for this basename: GLUCAP,  in  the last 72 hours  Imaging Dg Chest 2 View  03/10/2013   CLINICAL DATA:  Shortness of breath.  EXAM: CHEST  2 VIEW  COMPARISON:  9  FINDINGS: Normal cardiac contours. Mild fullness of the right hilum. Elevation of the right hemidiaphragm. Right greater than left biapical emphysematous change. Minimal linear opacities within the left lower lung. No definite pleural effusion or pneumothorax. Regional skeleton is unremarkable.  IMPRESSION: 1. Minimal linear opacities within the left lung base may represent atelectasis, scarring or infection. Followup radiography is recommended to ensure resolution or stability as findings are of uncertain chronicity. 2. Fullness of the right hilum likely secondary to prominent right pulmonary artery. Attention on short term followup radiography.   Electronically Signed   By: Annia Belt M.D.   On: 03/10/2013 18:11   Ct Angio Chest Pe W/cm &/or Wo Cm  03/10/2013   *RADIOLOGY REPORT*  Clinical Data: Shortness of breath, evaluate for pulmonary embolism  CT ANGIOGRAPHY CHEST  Technique:  Multidetector CT imaging of the chest using the standard  protocol during bolus administration of intravenous contrast. Multiplanar reconstructed images including MIPs were obtained and reviewed to evaluate the vascular anatomy.  Contrast: OMNIPAQUE IOHEXOL 350 MG/ML SOLN  Comparison: Chest radiograph - earlier same day  Vascular Findings:  There is adequate opacification of the pulmonary arterial system with the main pulmonary artery measuring 406 HU.  There is a saddle embolism within the bifurcation of the main pulmonary artery extending into the bilateral main pulmonary arteries, right greater than left.  There is near occlusive embolism extending throughout the segmental branches of the right upper lobe.  There is scattered nonocclusive embolism within the right middle and lower segmental pulmonary arteries.  There is nonocclusive embolism within the left upper and lower lobes.  The overall clot burden is deemed large.  This finding is associated with bowing of the interventricular septum with enlargement of the right ventricle with associated right to left ventricular ratio of greater than 1.  Normal caliber of the main pulmonary artery.  There is no reflux of injected contrast into the intrahepatic venous system.  Normal heart size.  Coronary calcifications.  No pericardial effusion.  Normal caliber thoracic aorta.  No definite thoracic aortic dissection or periaortic stranding.  Conventional configuration of the aortic arch.  ---------------------------------------------------  Nonvascular findings:  There is minimal dependent volume loss within the bilateral lower lobes, right greater than left.  There is a geographic approximately 1.1 x 0.8 cm subpleural nodular opacity within the superior segment left lower lobe (image 42, series seven).  There is geographic ground-glass atelectasis within the subpleural aspect of the right upper lobe (image 35, series seven).  No pleural effusion.  No pneumothorax.  The central pulmonary airways are patent.  No  mediastinal, hilar or axillary lymphadenopathy.  Limited early arterial phase evaluation of the upper abdomen is negative.  No acute or aggressive osseous abnormalities.  Stigmata of DISH within the thoracic spine.  IMPRESSION: 1.  Examination is positive for large volume saddle pulmonary embolism with associated findings suggestive of right-sided heart strain indicative of at least submassive pulmonary embolism.  2.  Scattered bilateral subpleural ground-glass opacities with dominant approximately 1.1 cm slightly nodular appearing opacity within the superior segment of the left lower lobe. These findings are nonspecific finding in the setting of described pulmonary embolism, though as a discrete underlying pulmonary nodule is not excluded, a follow-up chest CT in 4 to 6 weeks after resolution of symptoms is recommended.  Critical Value/emergent results were called by telephone at the time of interpretation on 03/10/2013 at 21 14 to [place], [place], who verbally acknowledged these results.   Original Report Authenticated By: Tacey Ruiz, MD    ASSESSMENT / PLAN:  PULMONARY A:Bilateral PE.   ? Nodules vs areas of infarct due to PE Rt le dvt R heart strain on TTE 03/11/13 P:   -O2 as needed -heparin gtt w plan to transition to coumadin -He will need lifelong anticoagulation, wants to start out on coumadin with option to change to xarelto in the future -hypercoag work up should be done in about a year. He will need a vacation from anticoagulation for a month before obtaining these labs. The genetic testing can be done now (Factor V Leiden and factor II mutation) -follow up ct for nodules in 6 months; expect these will resolve if they represent infarct -no role here for lytics in a hemodynamically stable patient -pt can follow with Dr Delton Coombes in the future to deal with the repeat scanning for pulm nodules.   We will sign off, please call if we can help further  Levy Pupa, MD, PhD 03/12/2013, 9:29  AM [facility] [phone] or if no answer [phone]

## 2013-03-12 NOTE — Evaluation (Signed)
Physical Therapy Evaluation Patient Details Name: Sean Jackson MRN: [id number] DOB: Dec 20, 1945 Today's Date: 03/12/2013 Time: 1135-1207 PT Time Calculation (min): 32 min  PT Assessment / Plan / Recommendation History of Present Illness  RLE DVT, PE  Clinical Impression  *Pt admitted with RLE DVT, PE*. Pt currently with functional limitations due to the deficits listed below (see PT Problem List). * Pt will benefit from skilled PT to increase their independence and safety with mobility to allow discharge to the venue listed below.   **    PT Assessment  Patient needs continued PT services    Follow Up Recommendations  No PT follow up    Does the patient have the potential to tolerate intense rehabilitation      Barriers to Discharge        Equipment Recommendations  Rolling walker with 5" wheels    Recommendations for Other Services OT consult   Frequency Min 3X/week    Precautions / Restrictions Precautions Precautions: None Precaution Comments: monitor O2 Restrictions Weight Bearing Restrictions: No   Pertinent Vitals/Pain *HR 122 with walking, 89 at rest SaO2 95% on RA, 84-96% on RA walking 0/10 pain**      Mobility  Bed Mobility Bed Mobility: Supine to Sit Supine to Sit: 6: Modified independent (Device/Increase time);With rails Transfers Transfers: Sit to Stand;Stand to Sit Sit to Stand: 6: Modified independent (Device/Increase time) Stand to Sit: 6: Modified independent (Device/Increase time) Ambulation/Gait Ambulation/Gait Assistance: 5: Supervision Ambulation Distance (Feet): 100 Feet Assistive device: Rolling walker Ambulation/Gait Assistance Details: VCs for pursed lip breathing General Gait Details: HR 122 with walking, SaO2 84-96% on RA while walking    Exercises     PT Diagnosis: Generalized weakness;Difficulty walking  PT Problem List: Decreased activity tolerance;Cardiopulmonary status limiting activity PT Treatment Interventions: DME  instruction;Gait training;Stair training;Therapeutic exercise;Therapeutic activities;Patient/family education     PT Goals(Current goals can be found in the care plan section) Acute Rehab PT Goals Patient Stated Goal: return to independence with mobility PT Goal Formulation: With patient Time For Goal Achievement: 03/26/13 Potential to Achieve Goals: Good  Visit Information  Last PT Received On: 03/12/13 Assistance Needed: +1 History of Present Illness: RLE DVT, PE       Prior Functioning  Home Living Family/patient expects to be discharged to:: Private residence Living Arrangements: Other relatives (brother) Available Help at Discharge: Family;Available PRN/intermittently Type of Home: Apartment Home Access: Level entry Home Layout: One level Home Equipment: None Prior Function Level of Independence: Independent Communication Communication: No difficulties    Cognition  Cognition Arousal/Alertness: Awake/alert Behavior During Therapy: WFL for tasks assessed/performed Overall Cognitive Status: Within Functional Limits for tasks assessed    Extremity/Trunk Assessment Upper Extremity Assessment Upper Extremity Assessment: Overall WFL for tasks assessed Lower Extremity Assessment Lower Extremity Assessment: Overall WFL for tasks assessed Cervical / Trunk Assessment Cervical / Trunk Assessment: Normal   Balance Balance Balance Assessed: Yes Static Sitting Balance Static Sitting - Balance Support: Right upper extremity supported;Feet supported Static Sitting - Level of Assistance: 7: Independent Static Sitting - Comment/# of Minutes: 5  End of Session PT - End of Session Equipment Utilized During Treatment: Gait belt Activity Tolerance: Patient tolerated treatment well Patient left: in chair;with call bell/phone within reach Nurse Communication: Mobility status  GP     Ralene Bathe Kistler 03/12/2013, 12:34 PM [phone]

## 2013-03-12 NOTE — Progress Notes (Signed)
ANTICOAGULATION CONSULT NOTE - Follow Up Consult  Pharmacy Consult for Heparin/couomadin Indication: pulmonary embolus and DVT  No Known Allergies  Patient Measurements: Height: 5\' 6"  (167.6 cm) Weight: 152 lb 5.4 oz (69.1 kg) IBW/kg (Calculated) : 63.8 Heparin Dosing Weight:   Vital Signs: Temp: 98.4 F (36.9 C) (10/09 0546) Temp src: Oral (10/09 0546) BP: 118/90 mmHg (10/09 0546) Pulse Rate: 68 (10/09 0546)  Labs:  Recent Labs  03/10/13 1900  03/11/13 0335 03/11/13 1253 03/11/13 2010 03/12/13 0544  HGB 18.3*  --  16.2  --   --  15.8  HCT 51.2  --  46.3  --   --  46.1  PLT 198  --  185  --   --  194  LABPROT 13.6  --   --  16.2*  --  21.1*  INR 1.06  --   --  1.33  --  1.89*  HEPARINUNFRC  --   < > 0.95* 0.52 0.41 0.66  CREATININE 0.79  --  0.77  --   --  0.86  < > = values in this interval not displayed.  Estimated Creatinine Clearance: 74.2 ml/min (by C-G formula based on Cr of 0.86).   Medications:  Infusions:  . sodium chloride 10 mL/hr at 03/11/13 0605  . heparin 1,150 Units/hr (03/12/13 0500)    Assessment: Patient with saddle PE/DVT. History of DVT ~74yrs ago.   Heparin level: 0.66 on 1150 units/hr  INR: 1.89 (previous = 1.33, so trending up faster than would like)  CBC: Hgb and platelets WNL  No bleeding noted   Hypercoagulable panel: Homocysteine = 98.4 (elevated) ATIII = 81 Protein C = 60 (low) Protein S = 133 Lupus AC = not detected FV leiden = negative Prothromin gene mutation = negative Cardiolipin ab: WNL  Goal of Therapy:  INR 2-3 Heparin level 0.3-0.7 units/ml, with saddle PE - preferably keep 0.5-0.7 Monitor platelets by anticoagulation protocol: Yes   Plan:  Day #3/5 day minimum overlap for acute VTE (needs 24h overlap with therapeutic INR)  Continue heparin at 1150 units/hr, recheck level at 8pm to confirm therapeutic  Suggest heparin level goal 0.5-0.7 for saddle PE  Give Coumadin 4mg  po x 1 tonight (reduce dose for  INR rate of rise)  Daily INR and heparin levels  Juliette Alcide, PharmD, BCPS.   Pager: [phone]  03/12/2013,7:26 AM

## 2013-03-12 NOTE — Progress Notes (Signed)
OT Cancellation Note  Patient Details Name: Sean Jackson MRN: [id number] DOB: March 10, 1946   Cancelled Treatment:    Reason Eval/Treat Not Completed: Other (comment)  Pt just moved to 4th floor.  Will likely not have a chance to check on him later today.  Will plan to return tomorrow.    Errika Narvaiz 03/12/2013, 3:13 PM Marica Otter, OTR/L [phone] 03/12/2013

## 2013-03-13 DIAGNOSIS — E876 Hypokalemia: Secondary | ICD-10-CM

## 2013-03-13 LAB — BASIC METABOLIC PANEL
BUN: 8 mg/dL (ref 6–23)
CO2: 24 mEq/L (ref 19–32)
Calcium: 9.2 mg/dL (ref 8.4–10.5)
Chloride: 100 mEq/L (ref 96–112)
Creatinine, Ser: 0.77 mg/dL (ref 0.50–1.35)
GFR calc Af Amer: >90 mL/min (ref >90)
Sodium: 138 mEq/L (ref 135–145)

## 2013-03-13 LAB — CBC
Hemoglobin: 15.8 g/dL (ref 13.0–17.0)
MCH: 38.6 pg — ABNORMAL HIGH (ref 26.0–34.0)
MCHC: 35.2 g/dL (ref 30.0–36.0)
Platelets: 234 10*3/uL (ref 150–400)
RBC: 4.09 MIL/uL — ABNORMAL LOW (ref 4.22–5.81)
RDW: 17.1 % — ABNORMAL HIGH (ref 11.5–15.5)

## 2013-03-13 LAB — PROTIME-INR
INR: 2.52 — ABNORMAL HIGH (ref 0.00–1.49)
Prothrombin Time: 26.3 seconds — ABNORMAL HIGH (ref 11.6–15.2)

## 2013-03-13 LAB — HEPARIN LEVEL (UNFRACTIONATED): Heparin Unfractionated: 0.53 IU/mL (ref 0.30–0.70)

## 2013-03-13 MED ORDER — WARFARIN SODIUM 2.5 MG PO TABS
2.5000 mg | ORAL_TABLET | Freq: Once | ORAL | Status: AC
  Administered 2013-03-13: 18:00:00 2.5 mg via ORAL
  Filled 2013-03-13: qty 1

## 2013-03-13 MED ORDER — POTASSIUM CHLORIDE CRYS ER 20 MEQ PO TBCR
40.0000 meq | EXTENDED_RELEASE_TABLET | Freq: Once | ORAL | Status: AC
  Administered 2013-03-13: 11:00:00 40 meq via ORAL
  Filled 2013-03-13: qty 2

## 2013-03-13 MED ORDER — ENOXAPARIN SODIUM 80 MG/0.8ML ~~LOC~~ SOLN
70.0000 mg | Freq: Two times a day (BID) | SUBCUTANEOUS | Status: DC
  Administered 2013-03-13 – 2013-03-14 (×3): 70 mg via SUBCUTANEOUS
  Filled 2013-03-13 (×4): qty 0.8

## 2013-03-13 MED ORDER — ENOXAPARIN (LOVENOX) PATIENT EDUCATION KIT
PACK | Freq: Once | Status: AC
  Administered 2013-03-13: 10:00:00
  Filled 2013-03-13: qty 1

## 2013-03-13 NOTE — Progress Notes (Signed)
Ambulating on room air pt's O2 dropped to as low as 74%. After returning to the room and resting O2 returned to 98% on room air. Julio Sicks RN

## 2013-03-13 NOTE — Progress Notes (Signed)
[facility] PROGRESS NOTE  MAT STUARD ZOX:[id number] DOB: [birth year] DOA: 03/10/2013 PCP: Beverley Fiedler, MD  Assessment/Plan: #1  saddle pulmonary embolus Likely etiology of patient's shortness of breath. Clinical improvement. Lower extremity Dopplers consistent with a right lower extremity DVT. Patient stated had a left lower extremity DVT approximately 5 years ago was on Coumadin for that. 2 D echo with EF 55 % and RV with moderately dilated cavity size and mildly to moderately reduced systolic function. INR is currently 2.52. Change IV heparin to full dose Lovenox. Continue Coumadin. Patient will likely need anticoagulation for life as this is his second episode. Patient will need repeat 2 D Echo in approx 6 months to f/u on RV. Will check a factor V Leiden and factor II mutation. May consider hypercoagulable panel in 1 year. Follow.  #2 right lower extremity DVT Questionable etiology. Patient denies any recent long car rides. Patient denies any recent surgeries. Patient denies any recent,. Patient denies any family history. Patient had a prior left lower extremity DVT apparently 5 years ago. Patient will likely require anticoagulation for life. INR is 2.52. Will change IV heparin 2 full dose Lovenox. PT/OT.  #3 hypokalemia Replete.  #4 prophylaxis Heparin for DVT prophylaxis.  Code Status: Full Family Communication: Updated patient no family at bedside. Disposition Plan: Home when medically stable hopefully tomorrow.   Consultants:  PCCM: Dr Delton Coombes  Procedures:  Lower extremity Dopplers 03/10/2013  CT Angio chest 03/10/2013  Chest x-ray 03/10/2013  2 d echo 03/11/13  Antibiotics:  None  HPI/Subjective: Patient states shortness of breath has improved. Denies any chest pain. Patient also noted to desat with ambulation.  Objective: Filed Vitals:   03/13/13 0553  BP: 116/66  Pulse: 68  Temp: 98.5 F (36.9 C)  Resp: 20    Intake/Output Summary (Last 24  hours) at 03/13/13 1511 Last data filed at 03/13/13 0804  Gross per 24 hour  Intake 195.33 ml  Output    500 ml  Net -304.67 ml   Filed Weights   03/10/13 1926 03/10/13 2211 03/12/13 1515  Weight: 72.576 kg (160 lb) 69.1 kg (152 lb 5.4 oz) 69.1 kg (152 lb 5.4 oz)    Exam:   General:  NAD  Cardiovascular: RRR  Respiratory: CTAB  Abdomen: Soft, nontender, nondistended, positive bowel sounds  Musculoskeletal: No clubbing cyanosis or edema  Data Reviewed: Basic Metabolic Panel:  Recent Labs Lab 03/10/13 1900 03/11/13 0335 03/12/13 0544 03/13/13 0344  NA 139 138 139 138  K 3.5 3.6 4.9 3.4*  CL 101 102 104 100  CO2 21 25 27 24   GLUCOSE 142* 131* 110* 101*  BUN 5* 5* 5* 8  CREATININE 0.79 0.77 0.86 0.77  CALCIUM 9.6 8.8 9.0 9.2   Liver Function Tests: No results found for this basename: AST, ALT, ALKPHOS, BILITOT, PROT, ALBUMIN,  in the last 168 hours No results found for this basename: LIPASE, AMYLASE,  in the last 168 hours No results found for this basename: AMMONIA,  in the last 168 hours CBC:  Recent Labs Lab 03/10/13 1900 03/11/13 0335 03/12/13 0544 03/13/13 0344  WBC 9.9 8.6 8.7 9.3  HGB 18.3* 16.2 15.8 15.8  HCT 51.2 46.3 46.1 44.9  MCV 107.6* 108.4* 109.5* 109.8*  PLT 198 185 194 234   Cardiac Enzymes: No results found for this basename: CKTOTAL, CKMB, CKMBINDEX, TROPONINI,  in the last 168 hours BNP (last 3 results)  Recent Labs  03/10/13 1900  PROBNP 730.5*   CBG: No results  found for this basename: GLUCAP,  in the last 168 hours  Recent Results (from the past 240 hour(s))  MRSA PCR SCREENING     Status: None   Collection Time    03/10/13 10:31 PM      Result Value Range Status   MRSA by PCR NEGATIVE  NEGATIVE Final   Comment:            The GeneXpert MRSA Assay (FDA     approved for NASAL specimens     only), is one component of a     comprehensive MRSA colonization     surveillance program. It is not     intended to diagnose  MRSA     infection nor to guide or     monitor treatment for     MRSA infections.     Studies: No results found.  Scheduled Meds: . enoxaparin (LOVENOX) injection  70 mg Subcutaneous BID  . enoxaparin   Does not apply Once  . sodium chloride  3 mL Intravenous Q12H  . warfarin  2.5 mg Oral ONCE-1800  . Warfarin - Pharmacist Dosing Inpatient   Does not apply q1800   Continuous Infusions: . sodium chloride Stopped (03/13/13 0755)    Principal Problem:   Saddle pulmonary embolus Active Problems:   Right leg DVT    Time spent: > 35 mins    [facility]  [facility] Pager [phone]. If 7PM-7AM, please contact night-coverage at [email or web address], password [facility] 03/13/2013, 3:11 PM  LOS: 3 days

## 2013-03-13 NOTE — Progress Notes (Signed)
[facility]  [facility] is providing the following services: Oxygen  If patient discharges after hours, please call [phone].   Renard Hamper 03/13/2013, 3:55 PM

## 2013-03-13 NOTE — Progress Notes (Signed)
Pts O2 sats were 87% resting on room air. Julio Sicks RN

## 2013-03-13 NOTE — Progress Notes (Signed)
ANTICOAGULATION CONSULT NOTE - Initial Consult  Pharmacy Consult for Lovenox, Coumadin Indication: pulmonary embolus and DVT  No Known Allergies  Patient Measurements: Height: 5\' 6"  (167.6 cm) Weight: 152 lb 5.4 oz (69.1 kg) IBW/kg (Calculated) : 63.8  Labs:  Recent Labs  03/11/13 0335 03/11/13 1253 03/11/13 2010 03/12/13 0544 03/13/13 0344  HGB 16.2  --   --  15.8 15.8  HCT 46.3  --   --  46.1 44.9  PLT 185  --   --  194 234  LABPROT  --  16.2*  --  21.1* 26.3*  INR  --  1.33  --  1.89* 2.52*  HEPARINUNFRC 0.95* 0.52 0.41 0.66 0.53  CREATININE 0.77  --   --  0.86 0.77    Estimated Creatinine Clearance: 79.8 ml/min (by C-G formula based on Cr of 0.77).   Medical History: Past Medical History  Diagnosis Date  . History of blood clots     Assessment: 54 yoM with h/o DVT (5 years ago) presented 10/7 with c/o SOB and RLL swelling. Dopplers confirm positive right lower leg DVT and CT angio revealed a large saddle PE. IV heparin and warfarin started and today (10/10) on D#4 of overlap - MD order to transition from IV heparin to sq Lovenox full dose. Plan life-long anticoagulation. Coumadin score = 4.   Heparin level therapeutic this AM, INR therapeutic at 2.52 (rise in INR quicker than anticipated). H/H and Hgb wnl, no bleeding/complications from therapy noted. Patient is on a regular diet. No significant drug-drug interactions noted.   Coumadin education completed by pharmacy 10/8  Discharge plan pending   Goal of Therapy:  INR 2-3 Anti-Xa level 0.6-1.2 units/ml 4hrs after LMWH dose given Monitor platelets by anticoagulation protocol: Yes   Plan:   Stop IV heparin and related labs   One hour after IV heparin discontinuation - start Lovenox 70 mg sq q12h   Coumadin 2.5mg  tonight.  If patient is discharge today, would give Coumadin 2.5mg  from the hospital before discharge then consider Coumadin 4mg  daily at discharge with close PT/INR check (Monday 10/13)  Per  CHEST guidelines, overlap is recommended for 5 days minimum AND until the INR is therapeutic for 2 days.   Geoffry Paradise, PharmD, BCPS Pager: [phone] 8:04 AM Pharmacy #: [id number]

## 2013-03-13 NOTE — Evaluation (Signed)
Occupational Therapy Evaluation Patient Details Name: Sean Jackson MRN: [id number] DOB: [birth year] Today's Date: 03/13/2013 Time: 1040-1100 OT Time Calculation (min): 20 min  OT Assessment / Plan / Recommendation History of present illness RLE DVT, PE   Clinical Impression   Pt presents to OT with decreased I with ADL activity and will benefit from skilled OT to increase I with ADL activity incorporating energy conservation strategies.    OT Assessment  Patient needs continued OT Services    Follow Up Recommendations  Home health OT       Equipment Recommendations  None recommended by OT    Recommendations for Other Services PT consult  Frequency  Min 2X/week    Precautions / Restrictions Precautions Precautions: None Precaution Comments: monitor O2 Restrictions Weight Bearing Restrictions: No       ADL  Grooming: Set up Where Assessed - Grooming: Unsupported sitting Upper Body Bathing: Set up Where Assessed - Upper Body Bathing: Unsupported sitting Lower Body Bathing: Minimal assistance Where Assessed - Lower Body Bathing: Unsupported sit to stand Upper Body Dressing: Set up Where Assessed - Upper Body Dressing: Unsupported sitting Lower Body Dressing: Minimal assistance Where Assessed - Lower Body Dressing: Unsupported sit to stand Toilet Transfer: Min Pension scheme manager Method: Sit to Barista: Regular height toilet Toileting - Clothing Manipulation and Hygiene: Supervision/safety Where Assessed - Glass blower/designer Manipulation and Hygiene: Standing    OT Diagnosis: Generalized weakness  OT Problem List: Decreased strength;Decreased activity tolerance;Cardiopulmonary status limiting activity OT Treatment Interventions: Self-care/ADL training;Patient/family education;DME and/or AE instruction;Energy conservation   OT Goals(Current goals can be found in the care plan section) Acute Rehab OT Goals Time For Goal Achievement:  03/27/13 ADL Goals Pt Will Perform Grooming: Independently;standing Pt Will Perform Upper Body Dressing: Independently;sitting Pt Will Perform Lower Body Dressing: Independently;sit to/from stand Pt Will Transfer to Toilet: Independently;regular height toilet Pt Will Perform Toileting - Clothing Manipulation and hygiene: Independently;sit to/from stand Additional ADL Goal #1: Pt will demonstrate energy conversation with ADL activity I ly  Visit Information  Last OT Received On: 03/13/13 History of Present Illness: RLE DVT, PE       Prior Functioning     Home Living Family/patient expects to be discharged to:: Private residence Living Arrangements: Other relatives (brother) Available Help at Discharge: Family;Available PRN/intermittently Type of Home: Apartment Home Access: Level entry Home Layout: One level Home Equipment: None Prior Function Level of Independence: Independent Communication Communication: No difficulties         Vision/Perception Vision - History Baseline Vision: Wears glasses for distance only   Cognition  Cognition Arousal/Alertness: Awake/alert Behavior During Therapy: WFL for tasks assessed/performed Overall Cognitive Status: Within Functional Limits for tasks assessed    Extremity/Trunk Assessment Upper Extremity Assessment Upper Extremity Assessment: Overall WFL for tasks assessed Cervical / Trunk Assessment Cervical / Trunk Assessment: Normal     Mobility Bed Mobility Bed Mobility: Supine to Sit Supine to Sit: 6: Modified independent (Device/Increase time);With rails Transfers Sit to Stand: 6: Modified independent (Device/Increase time) Stand to Sit: 6: Modified independent (Device/Increase time)           End of Session OT - End of Session Activity Tolerance: Patient tolerated treatment well;Other (comment) (pt did desat with walking) Patient left: in chair;with call bell/phone within reach Nurse Communication: Mobility status   GO     Alba Cory 03/13/2013, 11:04 AM

## 2013-03-14 DIAGNOSIS — E876 Hypokalemia: Secondary | ICD-10-CM | POA: Clinically undetermined

## 2013-03-14 LAB — BASIC METABOLIC PANEL
BUN: 9 mg/dL (ref 6–23)
Chloride: 105 mEq/L (ref 96–112)
Creatinine, Ser: 0.91 mg/dL (ref 0.50–1.35)
GFR calc non Af Amer: 85 mL/min — ABNORMAL LOW (ref >90)
Glucose, Bld: 131 mg/dL — ABNORMAL HIGH (ref 70–99)
Potassium: 4.1 mEq/L (ref 3.5–5.1)
Sodium: 137 mEq/L (ref 135–145)

## 2013-03-14 MED ORDER — WARFARIN SODIUM 2.5 MG PO TABS
2.5000 mg | ORAL_TABLET | Freq: Once | ORAL | Status: AC
  Administered 2013-03-14: 14:00:00 2.5 mg via ORAL
  Filled 2013-03-14: qty 1

## 2013-03-14 MED ORDER — WARFARIN SODIUM 2.5 MG PO TABS: 2.5000 mg | ORAL_TABLET | Freq: Every day | ORAL | Status: DC

## 2013-03-14 NOTE — Progress Notes (Signed)
   CARE MANAGEMENT NOTE 03/14/2013  Patient:  Sean Jackson, Sean Jackson   Account Number:  [id number]  Date Initiated:  03/11/2013  Documentation initiated by:  DAVIS,RHONDA  Subjective/Objective Assessment:   pt with bilateral pulmonary emboli, and le dvt, started on high dose of iv heparin, has hx of same, rt heart strain present.     Action/Plan:   home when stable   Anticipated DC Date:  03/14/2013   Anticipated DC Plan:  HOME/SELF CARE  In-house referral  NA      DC Planning Services  CM consult      [facility] Choice  HOME HEALTH   Choice offered to / List presented to:  C-1 Patient   DME arranged  OXYGEN      DME agency  [facility]     HH arranged  HH-2 PT  HH-3 OT      [facility] agency  [facility]   Status of service:  Completed, signed off Medicare Important Message given?  NA - LOS <3 / Initial given by admissions (If response is "NO", the following Medicare IM given date fields will be blank) Date Medicare IM given:   Date Additional Medicare IM given:    Discharge Disposition:  HOME W HOME HEALTH SERVICES  Per UR Regulation:  Reviewed for med. necessity/level of care/duration of stay  If discussed at Long Length of Stay Meetings, dates discussed:    Comments:  03/14/2013 1530 NCM spoke to pt and offered choice. Pt agreeable to [facility] for HH. Pt provided the contact info to call [facility] once he arrives home to deliver oxygen. States his brother is home to assist him. Isidoro Donning RN CCM Case Mgmt phone [phone]  03/13/13 MMcGibboney, RN, BSN Pt O2 sats 84% at rest.  [id number] Earlene Plater, RN, BSN, [place] [phone] Chart Reviewed for discharge and hospital needs. Discharge needs at time of review:  None Review of patient progress due on [id number].

## 2013-03-14 NOTE — Discharge Summary (Signed)
Physician Discharge Summary  Sean Jackson ZOX:[id number] DOB: 1945-09-01 DOA: 03/10/2013  PCP: Beverley Fiedler, MD  Admit date: 03/10/2013 Discharge date: 03/14/2013  Time spent: 65 minutes  Recommendations for Outpatient Follow-up:  1. Follow up with Beverley Fiedler, MD in 1 week. Patient will need followup CT scan done in 6 months to followup on pulmonary nodules which may be secondary to pulmonary embolus. Patient will also need a repeat 2-D echo done approximately 6 months to followup on right ventricular strain. Followup with me to be done a factor V Leiden and factor II mutation which are pending at the time of discharge. Patient also may need a hypercoagulable panel done in approximately a year after being off of Coumadin for about a month. Basic metabolic profile and CBC will need to be done on followup. Oxygen will also need to be reassessed on followup.   2.    Follow up at PCP office on Monday 03/16/13 for PT/INR checked and        further recommendations per PCP. 3. followup with Dr. Delton Coombes in 3 weeks to followup on pulmonary nodules noted on CT scan which may be secondary to PE for possible repeat scanning.      Discharge Diagnoses:  Principal Problem:   Saddle pulmonary embolus Active Problems:   Right leg DVT   Hypokalemia   Discharge Condition: Stable  Diet recommendation: Regular  Filed Weights   03/10/13 1926 03/10/13 2211 03/12/13 1515  Weight: 72.576 kg (160 lb) 69.1 kg (152 lb 5.4 oz) 69.1 kg (152 lb 5.4 oz)    History of present illness:  Sean Jackson is a 67 y.o. male  With no significant past medical history who presents to the ED with complaints of several days of gradually worsening SOB and RLE swelling. In the Ed, the patient was found to have a RLE DVT with a large saddle PE with evidence of R heart strain. The patient was started on therapeutic anticoagulation and the hospitalist was consulted for admission. No recent surgeries. No recent travel. On  further questioning, pt states he "pulled my muscle" in his leg and was therefore chairbound for the past month. Also reports a prior hx of LLE DVT about 5 years ago   Hospital Course:  #1 saddle pulmonary embolus  Patient had presented with worsening shortness of breath right lower extremity swelling for a few days prior to presentation to the ED. Patient was seen in the ED CT angio of chest which was done was significant for large saddle PE with evidence of right heart strain. Dopplers which were done was also positive for DVT. Patient was initially placed in the step down unit and started on IV heparin. Patient was followed patient improved daily and was also seen by pulmonary. Patient stated had a left lower extremity DVT approximately 5 years ago was on Coumadin for that. 2 D echo was done with EF 55 % and RV with moderately dilated cavity size and mildly to moderately reduced systolic function. Patient was subsequently started on Coumadin and IV heparin switched to Lovenox. Patient improved clinically however on exertion ED setting in the 70s to the 80s. Address patient's sats were greater than 92%. Patient's INR remained therapeutic with 48 hours overlap with Lovenox. Patient will be discharged home on Coumadin 2.5 mg daily with PT/INR to be checked on Monday. Patient will likely need anticoagulation for life as this is his second episode. Patient will need repeat 2 D Echo in approx 6 months to f/u  on RV. Factor V Leiden and factor II mutation were ordered and are pending. Patient may need a hypercoagulable panel done in a year after being off of Coumadin. Patient will be started on Coumadin with possible option to change to xarelto in the future however will defer to patient's PCP. In #2 right lower extremity DVT  Questionable etiology. Patient denied any recent long car rides. Patient denied any recent surgeries. Patient denied any family history. Patient had a prior left lower extremity DVT  apparently 5 years ago. Patient will likely require anticoagulation for life. Patient was anticoagulated as in problem #1 and will be discharged home on Coumadin with followup with PCP.  #3 hypokalemia  Repleted.   Procedures: Lower extremity Dopplers 03/10/2013  CT Angio chest 03/10/2013  Chest x-ray 03/10/2013  2 d echo 03/11/13   Consultations: PCCM: Dr Delton Coombes   Discharge Exam: Filed Vitals:   03/14/13 0510  BP: 104/65  Pulse: 89  Temp: 98.6 F (37 C)  Resp: 20    General: NAD Cardiovascular: RRR Respiratory: CTAB  Discharge Instructions      Discharge Orders   Future Orders Complete By Expires   Diet general  As directed    Discharge instructions  As directed    Comments:     Follow up with Beverley Fiedler, MD in 1 week. Follow up at PCP office on Monday 03/16/13 for PT/INR check/ coumadin check.   Increase activity slowly  As directed        Medication List         multivitamin with minerals Tabs tablet  Take 1 tablet by mouth daily.     warfarin 2.5 MG tablet  Commonly known as:  COUMADIN  Take 1 tablet (2.5 mg total) by mouth daily.  Start taking on:  03/15/2013       No Known Allergies Follow-up Information   Follow up with Beverley Fiedler, MD. Schedule an appointment as soon as possible for a visit in 1 week.   Specialty:  Family Medicine   Contact information:   [address] [place] [address] [phone]       Follow up with Beverley Fiedler, MD. Schedule an appointment as soon as possible for a visit on 03/16/2013. (f/u for PT/INR/ coumadin check)    Specialty:  Family Medicine   Contact information:   [address] [place] [address] [phone]       Follow up with Leslye Peer., MD. Schedule an appointment as soon as possible for a visit in 3 weeks.   Specialty:  Pulmonary Disease   Contact information:   [address] [place] [address] [phone]        The results of significant  diagnostics from this hospitalization (including imaging, microbiology, ancillary and laboratory) are listed below for reference.    Significant Diagnostic Studies: Dg Chest 2 View  03/10/2013   CLINICAL DATA:  Shortness of breath.  EXAM: CHEST  2 VIEW  COMPARISON:  9  FINDINGS: Normal cardiac contours. Mild fullness of the right hilum. Elevation of the right hemidiaphragm. Right greater than left biapical emphysematous change. Minimal linear opacities within the left lower lung. No definite pleural effusion or pneumothorax. Regional skeleton is unremarkable.  IMPRESSION: 1. Minimal linear opacities within the left lung base may represent atelectasis, scarring or infection. Followup radiography is recommended to ensure resolution or stability as findings are of uncertain chronicity. 2. Fullness of the right hilum likely secondary to prominent right pulmonary artery. Attention on short  term followup radiography.   Electronically Signed   By: Annia Belt M.D.   On: 03/10/2013 18:11   Ct Angio Chest Pe W/cm &/or Wo Cm  03/10/2013   *RADIOLOGY REPORT*  Clinical Data: Shortness of breath, evaluate for pulmonary embolism  CT ANGIOGRAPHY CHEST  Technique:  Multidetector CT imaging of the chest using the standard protocol during bolus administration of intravenous contrast. Multiplanar reconstructed images including MIPs were obtained and reviewed to evaluate the vascular anatomy.  Contrast: OMNIPAQUE IOHEXOL 350 MG/ML SOLN  Comparison: Chest radiograph - earlier same day  Vascular Findings:  There is adequate opacification of the pulmonary arterial system with the main pulmonary artery measuring 406 HU.  There is a saddle embolism within the bifurcation of the main pulmonary artery extending into the bilateral main pulmonary arteries, right greater than left.  There is near occlusive embolism extending throughout the segmental branches of the right upper lobe.  There is scattered nonocclusive embolism within  the right middle and lower segmental pulmonary arteries.  There is nonocclusive embolism within the left upper and lower lobes.  The overall clot burden is deemed large.  This finding is associated with bowing of the interventricular septum with enlargement of the right ventricle with associated right to left ventricular ratio of greater than 1.  Normal caliber of the main pulmonary artery.  There is no reflux of injected contrast into the intrahepatic venous system.  Normal heart size.  Coronary calcifications.  No pericardial effusion.  Normal caliber thoracic aorta.  No definite thoracic aortic dissection or periaortic stranding.  Conventional configuration of the aortic arch.  ---------------------------------------------------  Nonvascular findings:  There is minimal dependent volume loss within the bilateral lower lobes, right greater than left.  There is a geographic approximately 1.1 x 0.8 cm subpleural nodular opacity within the superior segment left lower lobe (image 42, series seven).  There is geographic ground-glass atelectasis within the subpleural aspect of the right upper lobe (image 35, series seven).  No pleural effusion.  No pneumothorax.  The central pulmonary airways are patent.  No mediastinal, hilar or axillary lymphadenopathy.  Limited early arterial phase evaluation of the upper abdomen is negative.  No acute or aggressive osseous abnormalities.  Stigmata of DISH within the thoracic spine.  IMPRESSION: 1.  Examination is positive for large volume saddle pulmonary embolism with associated findings suggestive of right-sided heart strain indicative of at least submassive pulmonary embolism.  2.  Scattered bilateral subpleural ground-glass opacities with dominant approximately 1.1 cm slightly nodular appearing opacity within the superior segment of the left lower lobe. These findings are nonspecific finding in the setting of described pulmonary embolism, though as a discrete underlying pulmonary  nodule is not excluded, a follow-up chest CT in 4 to 6 weeks after resolution of symptoms is recommended.  Critical Value/emergent results were called by telephone at the time of interpretation on 03/10/2013 at 21 14 to [place], [place], who verbally acknowledged these results.   Original Report Authenticated By: Tacey Ruiz, MD    Microbiology: Recent Results (from the past 240 hour(s))  MRSA PCR SCREENING     Status: None   Collection Time    03/10/13 10:31 PM      Result Value Range Status   MRSA by PCR NEGATIVE  NEGATIVE Final   Comment:            The GeneXpert MRSA Assay (FDA     approved for NASAL specimens     only), is one  component of a     comprehensive MRSA colonization     surveillance program. It is not     intended to diagnose MRSA     infection nor to guide or     monitor treatment for     MRSA infections.     Labs: Basic Metabolic Panel:  Recent Labs Lab 03/10/13 1900 03/11/13 0335 03/12/13 0544 03/13/13 0344 03/14/13 0410  NA 139 138 139 138 137  K 3.5 3.6 4.9 3.4* 4.1  CL 101 102 104 100 105  CO2 21 25 27 24 23   GLUCOSE 142* 131* 110* 101* 131*  BUN 5* 5* 5* 8 9  CREATININE 0.79 0.77 0.86 0.77 0.91  CALCIUM 9.6 8.8 9.0 9.2 9.1   Liver Function Tests: No results found for this basename: AST, ALT, ALKPHOS, BILITOT, PROT, ALBUMIN,  in the last 168 hours No results found for this basename: LIPASE, AMYLASE,  in the last 168 hours No results found for this basename: AMMONIA,  in the last 168 hours CBC:  Recent Labs Lab 03/10/13 1900 03/11/13 0335 03/12/13 0544 03/13/13 0344  WBC 9.9 8.6 8.7 9.3  HGB 18.3* 16.2 15.8 15.8  HCT 51.2 46.3 46.1 44.9  MCV 107.6* 108.4* 109.5* 109.8*  PLT 198 185 194 234   Cardiac Enzymes: No results found for this basename: CKTOTAL, CKMB, CKMBINDEX, TROPONINI,  in the last 168 hours BNP: BNP (last 3 results)  Recent Labs  03/10/13 1900  PROBNP 730.5*   CBG: No results found for this basename: GLUCAP,  in the  last 168 hours     Signed:  THOMPSON,DANIEL  [facility] 03/14/2013, 1:19 PM

## 2013-03-24 ENCOUNTER — Telehealth: Payer: Self-pay | Admitting: Emergency Medicine

## 2013-03-24 NOTE — Telephone Encounter (Signed)
Spoke with the pt and scheduled HFU with TP for 04/06/13 at 11 am  Nothing further needed per pt

## 2013-04-06 ENCOUNTER — Ambulatory Visit (INDEPENDENT_AMBULATORY_CARE_PROVIDER_SITE_OTHER): Payer: Medicare Other | Admitting: Adult Health

## 2013-04-06 ENCOUNTER — Encounter: Payer: Self-pay | Admitting: Adult Health

## 2013-04-06 VITALS — BP 132/86 | HR 98 | Temp 98.5°F | Ht 65.5 in | Wt 156.4 lb

## 2013-04-06 DIAGNOSIS — I2692 Saddle embolus of pulmonary artery without acute cor pulmonale: Secondary | ICD-10-CM

## 2013-04-06 DIAGNOSIS — R918 Other nonspecific abnormal finding of lung field: Secondary | ICD-10-CM

## 2013-04-06 DIAGNOSIS — I82401 Acute embolism and thrombosis of unspecified deep veins of right lower extremity: Secondary | ICD-10-CM

## 2013-04-06 DIAGNOSIS — I82409 Acute embolism and thrombosis of unspecified deep veins of unspecified lower extremity: Secondary | ICD-10-CM

## 2013-04-06 NOTE — Assessment & Plan Note (Addendum)
03/10/13 CT chest + PE >also noted pulmonary nodules ? Related to PE infacts  Vs nodules  Never smoker.  Repeat CT chest ~09/2013 >>

## 2013-04-06 NOTE — Progress Notes (Signed)
Subjective:    Patient ID: Sean Jackson, male    DOB: 06-21-1945, 67 y.o.   MRN: [id number]  HPI 67 yo never smoker seen for pulmonary consult 03/10/13 for unprovoked saddle  PE with R. LE DVT  Right heart strain on Echo  Factor V Leiden and factor II mutation neg   04/06/2013 Post Hospital follow up  Pt returns for post hospital follow up  Admitted with leg swelling/pain and dyspnea on 03/10/13 found to have  RLE DVT with a large saddle PE with evidence of R heart strain. The patient was started on therapeutic anticoagulation . Felt unprovoked as could not find possible cause.  2 D echo was done with EF 55 % and RV with moderately dilated cavity size and mildly to moderately reduced systolic function. CT chest did show bilateral ground glass opacities.? Secondary to PE . Plans to repeat CT chest in 6 months.  He has no hemotpysis, wt loss, never smoker.     Patient stated had a left lower extremity DVT approximately 5 years ago was on Coumadin for that. 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 Since discharge doing well on coumadin with no known bleeding. INR checks at PCP.  Was discharged on o2 with walking.  Sats drop to 85% on RA in office with walking.   No chest pain, orthopnea or edema.  Right leg swelling has resolved.       Review of Systems  Constitutional:   No  weight loss, night sweats,  Fevers, chills, fatigue, or  lassitude.  HEENT:   No headaches,  Difficulty swallowing,  Tooth/dental problems, or  Sore throat,                No sneezing, itching, ear ache, nasal congestion, post nasal drip,   CV:  No chest pain,  Orthopnea, PND, swelling in lower extremities, anasarca, dizziness, palpitations, syncope.   GI  No heartburn, indigestion, abdominal pain, nausea, vomiting, diarrhea, change in bowel habits, loss of appetite, bloody stools.   Resp:  No excess mucus, no productive cough,  No non-productive cough,  No  coughing up of blood.  No change in color of mucus.  No wheezing.  No chest wall deformity  Skin: no rash or lesions.  GU: no dysuria, change in color of urine, no urgency or frequency.  No flank pain, no hematuria   MS:  No joint pain or swelling.  No decreased range of motion.  No back pain.  Psych:  No change in mood or affect. No depression or anxiety.  No memory loss.         Objective:   Physical Exam GEN: A/Ox3; pleasant , NAD, well nourished   HEENT:  [place]/AT,  EACs-clear, TMs-wnl, NOSE-clear, THROAT-clear, no lesions, no postnasal drip or exudate noted.   NECK:  Supple w/ fair ROM; no JVD; normal carotid impulses w/o bruits; no thyromegaly or nodules palpated; no lymphadenopathy.  RESP  Clear  P & A; w/o, wheezes/ rales/ or rhonchi.no accessory muscle use, no dullness to percussion  CARD:  RRR, no m/r/g  , no peripheral edema, pulses intact, no cyanosis or clubbing.  GI:   Soft & nt; nml bowel sounds; no organomegaly or masses detected.  Musco: Warm bil, no deformities or joint swelling noted.   Neuro: alert, no focal deficits noted.    Skin: Warm, skin seborrhea on face /legs noted.  Assessment & Plan:

## 2013-04-06 NOTE — Patient Instructions (Signed)
Continue on Coumadin as directed- follow for lab checks as planned with Dr. Luciana Axe .  Wear Oxygen 2 l/m with activity. We will plan to repeat your CT chest and 2 D echo in 6 months (~April 2015)   Follow up Dr. Delton Coombes  In 4 weeks and As needed

## 2013-04-06 NOTE — Assessment & Plan Note (Addendum)
03/10/13 Unprovoked  Saddle PE /R LE DVT (# 2 DVT ) >Discharged on Couamdin  Echo >right heart strain Factor V Leiden Neg , factor II mutation neg  Repeat CT chest and echo >~09/2013  Pt will most likely need lifelong anticoagulation however will consider  hypercoagulable panel in future (1 yr ) if able to come off coumadin  Advised on health maintenance screening as he has never had colonoscopy.  Cont to desat with walking , will cont O2 with activity , will assess on return if able to d/c O2.    Plan  Continue on Coumadin as directed- follow for lab checks as planned with Dr. Luciana Axe .  Wear Oxygen 2 l/m with activity. We will plan to repeat your CT chest and 2 D echo in 6 months (~April 2015)   Follow up Dr. Delton Coombes  In 4 weeks and As needed

## 2013-05-14 ENCOUNTER — Ambulatory Visit (INDEPENDENT_AMBULATORY_CARE_PROVIDER_SITE_OTHER): Payer: Medicare Other | Admitting: Emergency Medicine

## 2013-05-14 ENCOUNTER — Encounter: Payer: Self-pay | Admitting: Emergency Medicine

## 2013-05-14 VITALS — BP 128/82 | HR 93 | Ht 66.0 in | Wt 152.4 lb

## 2013-05-14 DIAGNOSIS — I2692 Saddle embolus of pulmonary artery without acute cor pulmonale: Secondary | ICD-10-CM

## 2013-05-14 NOTE — Addendum Note (Signed)
Addended by: Gwynneth Aliment A on: 05/14/2013 02:23 PM   Modules accepted: Orders

## 2013-05-14 NOTE — Assessment & Plan Note (Signed)
Will need coumadin or other anti-coag for life Repeat TTE in the Spring 2015 Increase activity level rov 6

## 2013-05-14 NOTE — Patient Instructions (Signed)
Please continue your coumadin as directed You are cleared to restart your exercise routine We will put you on the waiting list to start Pulmonary Rehab at [facility] We will repeat your echocardiogram in the Spring 2015 Follow with Dr Delton Coombes in 6 months or sooner if you have any problems

## 2013-05-14 NOTE — Progress Notes (Signed)
Subjective:    Patient ID: Sean Jackson, male    DOB: 1945/09/25, 67 y.o.   MRN: [id number]  HPI 67 yo never smoker seen for pulmonary consult 03/10/13 for unprovoked saddle  PE with R. LE DVT  Right heart strain on Echo  Factor V Leiden and factor II mutation neg   Post Hospital follow up 04/06/13 Pt returns for post hospital follow up  Admitted with leg swelling/pain and dyspnea on 03/10/13 found to have  RLE DVT with a large saddle PE with evidence of R heart strain. The patient was started on therapeutic anticoagulation . Felt unprovoked as could not find possible cause.  2 D echo was done with EF 55 % and RV with moderately dilated cavity size and mildly to moderately reduced systolic function. CT chest did show bilateral ground glass opacities.? Secondary to PE . Plans to repeat CT chest in 6 months.  He has no hemotpysis, wt loss, never smoker.     Patient stated had a left lower extremity DVT approximately 5 years ago was on Coumadin for that. 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 Since discharge doing well on coumadin with no known bleeding. INR checks at PCP.  Was discharged on o2 with walking.  Sats drop to 85% on RA in office with walking.   No chest pain, orthopnea or edema.  Right leg swelling has resolved.   ROV 05/14/13 -- follows up for large PE, associated hypoxemia in 10/'14. Also with hx  He has been treated with coumadin managed by Dr Barbaraann Barthel.  He has been fairly inactive, wants to build up his strength again. His genetic testing was negative.       Review of Systems  Constitutional:   No  weight loss, night sweats,  Fevers, chills, fatigue, or  lassitude.  HEENT:   No headaches,  Difficulty swallowing,  Tooth/dental problems, or  Sore throat,                No sneezing, itching, ear ache, nasal congestion, post nasal drip,   CV:  No chest pain,  Orthopnea, PND, swelling in lower extremities, anasarca,  dizziness, palpitations, syncope.   GI  No heartburn, indigestion, abdominal pain, nausea, vomiting, diarrhea, change in bowel habits, loss of appetite, bloody stools.   Resp:  No excess mucus, no productive cough,  No non-productive cough,  No coughing up of blood.  No change in color of mucus.  No wheezing.  No chest wall deformity  Skin: no rash or lesions.  GU: no dysuria, change in color of urine, no urgency or frequency.  No flank pain, no hematuria   MS:  No joint pain or swelling.  No decreased range of motion.  No back pain.  Psych:  No change in mood or affect. No depression or anxiety.  No memory loss.       Objective:   Physical Exam Filed Vitals:   05/14/13 1346  BP: 128/82  Pulse: 93  Height: 5\' 6"  (1.676 m)  Weight: 152 lb 6.4 oz (69.128 kg)  SpO2: 97%    GEN: A/Ox3; pleasant , NAD, well nourished   HEENT:  [place]/AT,  EACs-clear, TMs-wnl, NOSE-clear, THROAT-clear, no lesions, no postnasal drip or exudate noted.   NECK:  Supple w/ fair ROM; no JVD; normal carotid impulses w/o bruits; no thyromegaly or nodules palpated; no lymphadenopathy.  RESP  Clear  P & A; w/o, wheezes/ rales/ or rhonchi.no accessory muscle  use, no dullness to percussion  CARD:  RRR, ? Faint S4, no peripheral edema, pulses intact, no cyanosis or clubbing.  Musco: Warm bil, no deformities or joint swelling noted.   Neuro: alert, no focal deficits noted.    Skin: Warm, skin seborrhea on face /legs noted.          Assessment & Plan:  Saddle pulmonary embolus Will need coumadin or other anti-coag for life Repeat TTE in the Spring 2015 Increase activity level rov 6

## 2013-05-18 ENCOUNTER — Emergency Department (HOSPITAL_COMMUNITY): Payer: Medicare Other

## 2013-05-18 ENCOUNTER — Encounter (HOSPITAL_COMMUNITY): Payer: Self-pay | Admitting: Emergency Medicine

## 2013-05-18 ENCOUNTER — Inpatient Hospital Stay (HOSPITAL_COMMUNITY)
Admission: EM | Admit: 2013-05-18 | Discharge: 2013-05-20 | DRG: 176 | Disposition: A | Discharge status: Home / Self Care | Payer: Medicare Other | Attending: Pulmonary Disease | Admitting: Pulmonary Disease

## 2013-05-18 DIAGNOSIS — J811 Chronic pulmonary edema: Secondary | ICD-10-CM | POA: Diagnosis present

## 2013-05-18 DIAGNOSIS — R42 Dizziness and giddiness: Secondary | ICD-10-CM

## 2013-05-18 DIAGNOSIS — K7689 Other specified diseases of liver: Secondary | ICD-10-CM | POA: Diagnosis present

## 2013-05-18 DIAGNOSIS — R0902 Hypoxemia: Secondary | ICD-10-CM

## 2013-05-18 DIAGNOSIS — I369 Nonrheumatic tricuspid valve disorder, unspecified: Secondary | ICD-10-CM

## 2013-05-18 DIAGNOSIS — R791 Abnormal coagulation profile: Secondary | ICD-10-CM | POA: Diagnosis present

## 2013-05-18 DIAGNOSIS — F1010 Alcohol abuse, uncomplicated: Secondary | ICD-10-CM

## 2013-05-18 DIAGNOSIS — I82401 Acute embolism and thrombosis of unspecified deep veins of right lower extremity: Secondary | ICD-10-CM

## 2013-05-18 DIAGNOSIS — I2692 Saddle embolus of pulmonary artery without acute cor pulmonale: Secondary | ICD-10-CM

## 2013-05-18 DIAGNOSIS — Z7901 Long term (current) use of anticoagulants: Secondary | ICD-10-CM

## 2013-05-18 DIAGNOSIS — I519 Heart disease, unspecified: Secondary | ICD-10-CM | POA: Diagnosis present

## 2013-05-18 DIAGNOSIS — I2789 Other specified pulmonary heart diseases: Secondary | ICD-10-CM | POA: Diagnosis present

## 2013-05-18 DIAGNOSIS — T45511A Poisoning by anticoagulants, accidental (unintentional), initial encounter: Secondary | ICD-10-CM

## 2013-05-18 DIAGNOSIS — T458X1A Poisoning by other primarily systemic and hematological agents, accidental (unintentional), initial encounter: Secondary | ICD-10-CM

## 2013-05-18 DIAGNOSIS — I498 Other specified cardiac arrhythmias: Secondary | ICD-10-CM | POA: Diagnosis not present

## 2013-05-18 DIAGNOSIS — R911 Solitary pulmonary nodule: Secondary | ICD-10-CM | POA: Diagnosis present

## 2013-05-18 DIAGNOSIS — E876 Hypokalemia: Secondary | ICD-10-CM

## 2013-05-18 DIAGNOSIS — R0600 Dyspnea, unspecified: Secondary | ICD-10-CM

## 2013-05-18 DIAGNOSIS — R918 Other nonspecific abnormal finding of lung field: Secondary | ICD-10-CM

## 2013-05-18 DIAGNOSIS — Z79899 Other long term (current) drug therapy: Secondary | ICD-10-CM

## 2013-05-18 DIAGNOSIS — I82509 Chronic embolism and thrombosis of unspecified deep veins of unspecified lower extremity: Secondary | ICD-10-CM | POA: Diagnosis present

## 2013-05-18 DIAGNOSIS — D7589 Other specified diseases of blood and blood-forming organs: Secondary | ICD-10-CM | POA: Diagnosis present

## 2013-05-18 DIAGNOSIS — R000 Tachycardia, unspecified: Secondary | ICD-10-CM

## 2013-05-18 DIAGNOSIS — I2782 Chronic pulmonary embolism: Principal | ICD-10-CM | POA: Diagnosis present

## 2013-05-18 DIAGNOSIS — D45 Polycythemia vera: Secondary | ICD-10-CM | POA: Diagnosis present

## 2013-05-18 DIAGNOSIS — R7309 Other abnormal glucose: Secondary | ICD-10-CM | POA: Diagnosis present

## 2013-05-18 DIAGNOSIS — R0609 Other forms of dyspnea: Secondary | ICD-10-CM

## 2013-05-18 DIAGNOSIS — T45515A Adverse effect of anticoagulants, initial encounter: Secondary | ICD-10-CM | POA: Diagnosis present

## 2013-05-18 DIAGNOSIS — E860 Dehydration: Secondary | ICD-10-CM

## 2013-05-18 HISTORY — DX: Chronic pulmonary edema: J81.1

## 2013-05-18 HISTORY — DX: Fracture of unspecified carpal bone, left wrist, initial encounter for closed fracture: S62.102A

## 2013-05-18 LAB — COMPREHENSIVE METABOLIC PANEL
ALT: 33 U/L (ref 0–53)
Alkaline Phosphatase: 100 U/L (ref 39–117)
BUN: 9 mg/dL (ref 6–23)
CO2: 22 mEq/L (ref 19–32)
Chloride: 97 mEq/L (ref 96–112)
GFR calc Af Amer: >90 mL/min (ref >90)
GFR calc non Af Amer: >90 mL/min (ref >90)
Glucose, Bld: 175 mg/dL — ABNORMAL HIGH (ref 70–99)
Potassium: 3.7 mEq/L (ref 3.5–5.1)
Sodium: 138 mEq/L (ref 135–145)
Total Bilirubin: 1 mg/dL (ref 0.3–1.2)
Total Protein: 6.7 g/dL (ref 6.0–8.3)

## 2013-05-18 LAB — CBC WITH DIFFERENTIAL/PLATELET
Eosinophils Absolute: 0 10*3/uL (ref 0.0–0.7)
Hemoglobin: 17.2 g/dL — ABNORMAL HIGH (ref 13.0–17.0)
Lymphocytes Relative: 8 % — ABNORMAL LOW (ref 12–46)
Lymphs Abs: 0.9 10*3/uL (ref 0.7–4.0)
MCH: 36.4 pg — ABNORMAL HIGH (ref 26.0–34.0)
Monocytes Absolute: 0.7 10*3/uL (ref 0.1–1.0)
Monocytes Relative: 6 % (ref 3–12)
Neutrophils Relative %: 86 % — ABNORMAL HIGH (ref 43–77)
Platelets: 155 10*3/uL (ref 150–400)
RBC: 4.72 MIL/uL (ref 4.22–5.81)
RDW: 17 % — ABNORMAL HIGH (ref 11.5–15.5)
WBC: 11.7 10*3/uL — ABNORMAL HIGH (ref 4.0–10.5)

## 2013-05-18 LAB — URINALYSIS, ROUTINE W REFLEX MICROSCOPIC
Bilirubin Urine: NEGATIVE
Hgb urine dipstick: NEGATIVE
Ketones, ur: NEGATIVE mg/dL
Leukocytes, UA: NEGATIVE
Nitrite: NEGATIVE
Protein, ur: NEGATIVE mg/dL
Urobilinogen, UA: 1 mg/dL (ref 0.0–1.0)
pH: 5.5 (ref 5.0–8.0)

## 2013-05-18 LAB — CBC
HCT: 42.9 % (ref 39.0–52.0)
Hemoglobin: 15.4 g/dL (ref 13.0–17.0)
MCV: 101.9 fL — ABNORMAL HIGH (ref 78.0–100.0)
Platelets: 149 10*3/uL — ABNORMAL LOW (ref 150–400)
RDW: 17.2 % — ABNORMAL HIGH (ref 11.5–15.5)
WBC: 11 10*3/uL — ABNORMAL HIGH (ref 4.0–10.5)

## 2013-05-18 LAB — GLUCOSE, CAPILLARY: Glucose-Capillary: 141 mg/dL — ABNORMAL HIGH (ref 70–99)

## 2013-05-18 LAB — PROTIME-INR
INR: >10 (ref 0.00–1.49)
INR: >10 (ref 0.00–1.49)

## 2013-05-18 LAB — HEMOGLOBIN A1C: Mean Plasma Glucose: 134 mg/dL — ABNORMAL HIGH (ref <117)

## 2013-05-18 LAB — ETHANOL: Alcohol, Ethyl (B): <11 mg/dL (ref 0–11)

## 2013-05-18 LAB — POCT I-STAT TROPONIN I

## 2013-05-18 LAB — TYPE AND SCREEN: ABO/RH(D): A POS

## 2013-05-18 MED ORDER — ONDANSETRON HCL 4 MG/2ML IJ SOLN
4.0000 mg | Freq: Once | INTRAMUSCULAR | Status: AC
  Administered 2013-05-18: 4 mg via INTRAVENOUS
  Filled 2013-05-18: qty 2

## 2013-05-18 MED ORDER — SODIUM CHLORIDE 0.9 % IV SOLN: INTRAVENOUS | Status: DC

## 2013-05-18 MED ORDER — VITAMIN K1 10 MG/ML IJ SOLN
5.0000 mg | Freq: Once | INTRAMUSCULAR | Status: AC
  Administered 2013-05-18: 5 mg via SUBCUTANEOUS
  Filled 2013-05-18: qty 0.5

## 2013-05-18 MED ORDER — LORAZEPAM 2 MG/ML IJ SOLN
1.0000 mg | Freq: Once | INTRAMUSCULAR | Status: AC
  Administered 2013-05-18: 1 mg via INTRAVENOUS
  Filled 2013-05-18: qty 1

## 2013-05-18 MED ORDER — ACETAMINOPHEN 500 MG PO TABS: 1000.0000 mg | ORAL_TABLET | Freq: Three times a day (TID) | ORAL | Status: DC | PRN

## 2013-05-18 MED ORDER — SODIUM CHLORIDE 0.9 % IV BOLUS (SEPSIS)
1000.0000 mL | Freq: Once | INTRAVENOUS | Status: AC
  Administered 2013-05-18: 1000 mL via INTRAVENOUS

## 2013-05-18 MED ORDER — PANTOPRAZOLE SODIUM 40 MG PO TBEC
40.0000 mg | DELAYED_RELEASE_TABLET | Freq: Every day | ORAL | Status: DC
  Administered 2013-05-18 – 2013-05-20 (×3): 40 mg via ORAL
  Filled 2013-05-18 (×3): qty 1

## 2013-05-18 MED ORDER — IOHEXOL 350 MG/ML SOLN
100.0000 mL | Freq: Once | INTRAVENOUS | Status: AC | PRN
  Administered 2013-05-18: 100 mL via INTRAVENOUS

## 2013-05-18 MED ORDER — FUROSEMIDE 10 MG/ML IJ SOLN
40.0000 mg | Freq: Once | INTRAMUSCULAR | Status: AC
  Administered 2013-05-18: 15:00:00 40 mg via INTRAVENOUS
  Filled 2013-05-18: qty 4

## 2013-05-18 NOTE — ED Notes (Signed)
Respiratory at bedside.

## 2013-05-18 NOTE — ED Notes (Signed)
Pt's HR jumped into the 140s while using the urinal.

## 2013-05-18 NOTE — ED Notes (Signed)
Bed: WA08 Expected date:  Expected time:  Means of arrival:  Comments: EMS Shob recent PE

## 2013-05-18 NOTE — H&P (Signed)
PULMONARY  / CRITICAL CARE MEDICINE  Name: Sean Jackson MRN: [id number] DOB: 1945/08/23    ADMISSION DATE:  05/18/2013 CONSULTATION DATE:  12/15  REFERRING MD : Lynelle Doctor PRIMARY SERVICE:  PCCM  CHIEF COMPLAINT:  Dyspnea   BRIEF PATIENT DESCRIPTION:  This is a 67 year old male who was recently diagnosed w/ unprovoked PE and RLE in October 2014, this was his second VTE event (prior DVT in 2009), hypercoagulable panel negative. Admitted on 12/15 after acute onset of dyspnea early in the am 12/15. Initial eval showed decreased clot burden, but sig tachycardia and desaturation w/ activity. PCCM asked to admit.   SIGNIFICANT EVENTS / STUDIES:  CT chest 12/15: 1. Persistent, but improved, bilateral pulmonary emboli. No evidence of right heart strain.  2. Scattered bilateral airspace disease and/or scarring, unchanged. 3. Left lower lobe nodule persists.4. Hepatic steatosis. 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 He was seen in f/u at our office on 11/3 and 12/3 and noted to have exertional desaturation down to 85% on room air. Was in usual state of health until ~2am 12/15 when noticed rather sudden onset of increased exertional dyspnea. Denied pain, did think he had some light headedness and 1 event of near syncope which he said he fell to floor. No fever, chills, cough, no pleuritic chest discomfort. Placed himself on oxygen. Didn't really help much. Because of this he presented to the ER.  In ER work up included CT chest that demonstrated decreased clot burden of his recent PEs, and bibasilar airspace disease, w/ LLL pulmonary nodule. Pulm asked to admit as he desaturated during ambulation and became tachycardic.   PAST MEDICAL HISTORY :  Past Medical History   Diagnosis Date  . History of blood clots   . Pulmonary edema    History reviewed. No pertinent past surgical history. Prior to Admission medications   Medication Sig Start Date End Date Taking? Authorizing Provider  acetaminophen (TYLENOL) 500 MG tablet Take 1,000 mg by mouth every 8 (eight) hours as needed for mild pain or headache.   Yes Historical Provider, MD  warfarin (COUMADIN) 2 MG tablet Take 2 mg by mouth every evening.   Yes Historical Provider, MD   No Known Allergies  FAMILY HISTORY:  No family history on file. SOCIAL HISTORY:  reports that he has never smoked. He does not have any smokeless tobacco history on file. He reports that he drinks alcohol. He reports that he does not use illicit drugs.  Review of Systems:   Bolds are positive  Constitutional: weight loss, gain, night sweats, Fevers, chills, fatigue .  HEENT: headaches, Sore throat, sneezing, nasal congestion, post nasal drip, Difficulty swallowing, Tooth/dental problems, visual complaints visual changes, ear ache CV:  chest pain, radiates: ,Orthopnea, PND, swelling in lower extremities, dizziness, palpitations, near syncope.  GI  heartburn, indigestion, abdominal pain, nausea, vomiting, diarrhea, change in bowel habits, loss of appetite, bloody stools.  Resp: cough, productive: , hemoptysis, dyspnea, chest pain, pleuritic.  Skin: rash or itching or icterus GU: dysuria, change in color of urine, urgency or frequency. flank pain, hematuria  MS: joint pain or swelling. decreased range of motion  Psych: change in mood or affect. depression or anxiety.  Neuro: difficulty with speech, weakness, numbness, ataxia    SUBJECTIVE:   no distress.  VITAL SIGNS: Temp:  [98 F (36.7 C)] 98 F (36.7 C) (12/15 0514) Pulse Rate:  [111-115] 111 (12/15 0830) Resp:  [19-24] 24 (12/15 0830) BP: (121-162)/(78-105) 121/78 mmHg (12/15 0830) SpO2:  [91 %-94 %] 92 % (12/15 0830)  PHYSICAL EXAMINATION: General:  Chronically  ill appearing 67 year old male, appears older than stated age  Neuro:  Awake, oriented, no focal def  HEENT:  N/C, atraumatic. Has a large scabbed lesion on nose  Cardiovascular:  rrr Lungs:  No wheeze or rhonchi. Fine crackles in bases  Abdomen:  Soft, non-tender, + bowel sounds  Musculoskeletal:  Intact  Skin:  dry   Recent Labs Lab 05/18/13 0540  NA 138  K 3.7  CL 97  CO2 22  BUN 9  CREATININE 0.75  GLUCOSE 175*    Recent Labs Lab 05/18/13 0540  HGB 17.2*  HCT 47.6  WBC 11.7*  PLT 155   Dg Chest 2 View  05/18/2013   CLINICAL DATA:  New onset shortness of breath and nausea.  EXAM: CHEST  2 VIEW  COMPARISON:  Chest radiograph March 31, 2013.  FINDINGS: Similar minimal bibasilar airspace opacities, elevated right hemidiaphragm. Low inspiratory examination. No pleural effusions. Similar fullness the right pulmonary hilum. Cardiomediastinal silhouette is unremarkable and unchanged. No pneumothorax.  Multiple EKG lines overlie the patient and may obscure subtle underlying pathology. Mild degenerative change of thoracic spine. Soft tissue planes are nonsuspicious.  IMPRESSION: Similar minimal bibasilar airspace opacities favoring atelectasis in this low inspiratory examination. Similar prominence of the right pulmonary hilum could reflect pulmonary arterial hypertension.   Electronically Signed   By: Awilda Metro   On: 05/18/2013 06:14   Ct Angio Chest Pe W/cm &/or Wo Cm  05/18/2013   CLINICAL DATA:  Shortness of breath, nausea, fatigue. History of pulmonary embolus on 03/10/2013, on anti coagulation.  EXAM: CT ANGIOGRAPHY CHEST WITH CONTRAST  TECHNIQUE: Multidetector CT imaging of the chest was performed using the standard protocol during bolus administration of intravenous contrast. Multiplanar CT image reconstructions including MIPs were obtained to evaluate the vascular anatomy.  CONTRAST:  OMNIPAQUE IOHEXOL 350 MG/ML SOLN  COMPARISON:  03/10/2013.  FINDINGS:  Persistent filling defect is seen in the distal right pulmonary artery with extension into the lobar and segmental pulmonary arteries of the right upper, right middle and right lower lobes. The saddle portion of the pulmonary embolus, previously seen at the bifurcation of the main pulmonary artery, has resolved. A linear band is seen in the left upper lobe pulmonary artery (series 6, images 96-97) and may represent a small web. A thin low-attenuation filling defect persists in the left lower lobe pulmonary artery, at the lobar level (series 6, image 125).  No pathologically enlarged mediastinal, hilar or axillary lymph nodes. Coronary artery calcification. Heart size normal. No pericardial effusion.  Scattered, basilar dependent, airspace disease and/or scarring persist. An 8 x 12 mm nodular density in the superior segment left lower lobe (series 7, image 37) is unchanged. No pleural fluid. Airway is otherwise unremarkable.  Incidental imaging of the upper abdomen shows low attenuation throughout the visualized portion of the liver. No worrisome lytic or sclerotic lesions. Degenerative changes are seen in the spine.  Review of the MIP images confirms the above findings.  IMPRESSION: 1. Persistent, but improved, bilateral pulmonary emboli, as detailed above. No evidence of right heart strain. 2. Scattered bilateral airspace disease  and/or scarring, unchanged. 3. Left lower lobe nodule persists. Primary bronchogenic carcinoma is considered. These results were called by telephone at the time of interpretation on 05/18/2013 at 8:17 AM to Dr. Lynelle Doctor , who verbally acknowledged these results. 4. Hepatic steatosis.   Electronically Signed   By: Leanna Battles M.D.   On: 05/18/2013 08:19   Dg Finger Thumb Left  05/18/2013   CLINICAL DATA:  Thumb trauma, bruising and discoloration.  EXAM: LEFT THUMB 2+V  COMPARISON:  None available for comparison at time of study interpretation.  FINDINGS: Seen only on one view is a  linear lucency through the base of the 1st distal phalanx equivocal for nondisplaced fracture. Mild 1st interphalangeal joint space spurring. 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 No destructive bony lesions. Soft tissue planes are nonsuspicious. Partially imaged plate and screws within the wrist.  IMPRESSION: Possible nondisplaced base of 1st distal phalanx fracture seen on a single view. No dislocation.   Electronically Signed   By: Awilda Metro   On: 05/18/2013 06:19    ASSESSMENT / PLAN: 1) Recent Saddle PE (october 2014). This is his second unprovoked VTE. Hypercoagulable panel was negative. Comparative CT scans from 10/7 to 12/15 show progressively decreased clot burden. Has been taking coumadin as prescribed, in fact is hypercoagulable.  Plan: -Will continue warfarin protocol (see problem # 3 below) as will hold for now given supra therapeutic INR.  -Will need life long anitcoagulation -Given lesion on nose and LLL lung nodule worried that malignancy may be provoking factor, will need to get bx after d/c, ideally after clot burden resolved radiographically  2 ) exertional Hypoxia and dyspnea. This is likely multifactorial.  His CXR appears to have some element of pulmonary edema. He has had demonstrated episodes of exertional desaturation ever since his recent PE.Has only used oxygen PRN. Concerned that LV function and possibly secondary PAH could be playing a role here.  Plan Repeat echo Lasix x 1 Pulse ox Will need walking oximetry prior to d/x   3) supra-therapeutic INR (coumadin induced) Plan Hold coumadin Recheck INR and cbc later this am Will hold off from reversing for now.   4) mild sinus tachycardia: think this is due to exertion. No evidence of ischemia Plan Admit to tele   5) LLL lung nodule Plan Currently getting active treatment for PE. Given the fact that there is still sig clot burden (although improved), would repeat CT scan in 3 months. If still  persistent would consider CT guided Bx.   6) mild Hyperglycemia Plan Ck A1C  7) Mild transaminates. C/w ETOH related  Plan F/u lft in am   8) Macrocytosis w/ mild polycythemia  He admits to drinking at least one large glass of liquor a week  Plan -ck folate and B12 -supplemental oxygen    Pulmonary and Critical Care Medicine [facility] Pager: [phone]  05/18/2013, 10:31 AM   Attending:  I have seen and examined the patient with Anders Simmonds and agree with the note above.   Not really clear what is happening here, no clear evidence of new PE.  I agree we should assess his LV given ground glass and non-specific changes in the bases of his lungs.  Doesn't appear to have a second pulmonary process (other than old PE) going on and I doubt pulmonary hypertension.    Elevated INR but H/H normal and not bleeding, will monitor and avoid reversing INR for now given clot burden from 03/2013 PE.  Admit tele, our service  Yolonda Kida PCCM Pager: [phone] Cell: [phone] If no response, call [phone]

## 2013-05-18 NOTE — ED Notes (Signed)
Pt transported to admit room with tech on monitor. Belongings placed in bag and sent with pt.

## 2013-05-18 NOTE — ED Notes (Signed)
Asked pt for sample and he stated he wasn't able to at this time.

## 2013-05-18 NOTE — ED Provider Notes (Signed)
Pt left with me at change of shift to get results of CT angio chest.   Medications  phytonadione (VITAMIN K) SQ injection 5 mg (not administered)  sodium chloride 0.9 % bolus 1,000 mL (not administered)  ondansetron (ZOFRAN) injection 4 mg (4 mg Intravenous Given 05/18/13 0539)  LORazepam (ATIVAN) injection 1 mg (1 mg Intravenous Given 05/18/13 0656)  iohexol (OMNIPAQUE) 350 MG/ML injection 100 mL (100 mLs Intravenous Contrast Given 05/18/13 0720)      08:15 Dr Carmelina Noun, Radiologist called results of CT angio chest, saddle embolus is now gone, still has bilateral PE, still has concerning lung nodule.     Nursing staff attempted to walk patient with pulse ox monitoring however they state when they sat him on the side of the bed heart rate jumped up to 140 and his pulse ox dropped to 88%. When I talked to the patient at baseline his pulse ox on room air is 92%. However his heart rate is 120 and appears to be sinus tachycardia. Patient states he is taking his Coumadin as prescribed. He states he writes it down every day when he takes it. He states he takes 2 mg 5 days a week and 1 mg the other 2 days. He states he had a minor blood clot 4-5 years ago. He states this morning at one point he felt like his heart was racing but it feels better now. He reports during the night he had increasing shortness of breath over 1-2 hours. He states he went into the kitchen to get a drink and when he turned around he got dizzy and he fell to the floor. He states he felt dizzy and he feels tired. He states he saw his pulmonologist Dr. Delton Coombes 4 days ago and they discussed starting getting back to normal activity on January 1. He denies any change in his diet including leafy vegetables. ABG delayed b/o his extremely high INR. Pt denies hx of smoking. Pt states he only uses oxygen at home when he is going to go do some type of activity, such as going to the store.   Pt given FFP and Vit K to reverse his over coagulation  from coumadin.   09:23 Dr Mahala Menghini, wants to see patient and decide what type of bed he should be admitted to. He wants to talk to pulmonary.     ------------------------------------------------------------ Transthoracic Echocardiography  Patient: Erric, Machnik  Study Date: 03/11/2013 ------------------------------------------------------------ History: PMH: Saddle Embolus, RT Leg DVT, SOB Pulmonary Embolu ------------------------------------------------------------ Study Conclusions  - Left ventricle: Images are very limited. The estimated ejection fraction was 55%. Images were inadequate for LV wall motion assessment. - Aortic valve: Poorly visualized. The valve appears to be grossly normal. - Mitral valve: Poorly visualized. The valve appears to be grossly normal. - Right ventricle: The cavity size was moderately dilated. Systolic function was mildly to moderately reduced.   Dg Chest 2 View  05/18/2013   CLINICAL DATA:  New onset shortness of breath and nausea.  EXAM: CHEST  2 VIEW  COMPARISON:  Chest radiograph March 31, 2013.  FINDINGS: Similar minimal bibasilar airspace opacities, elevated right hemidiaphragm. Low inspiratory examination. No pleural effusions. Similar fullness the right pulmonary hilum. Cardiomediastinal silhouette is unremarkable and unchanged. No pneumothorax.  Multiple EKG lines overlie the patient and may obscure subtle underlying pathology. Mild degenerative change of thoracic spine. Soft tissue planes are nonsuspicious.  IMPRESSION: Similar minimal bibasilar airspace opacities favoring atelectasis in this low inspiratory examination. Similar prominence of the  right pulmonary hilum could reflect pulmonary arterial hypertension.   Electronically Signed   By: Awilda Metro   On: 05/18/2013 06:14   Ct Angio Chest Pe W/cm &/or Wo Cm  05/18/2013   CLINICAL DATA:  Shortness of breath, nausea, fatigue. History of pulmonary embolus on 03/10/2013, on anti  coagulation.  EXAM: CT ANGIOGRAPHY CHEST WITH CONTRAST  TECHNIQUE: Multidetector CT imaging of the chest was performed using the standard protocol during bolus administration of intravenous contrast. Multiplanar CT image reconstructions including MIPs were obtained to evaluate the vascular anatomy.  CONTRAST:  OMNIPAQUE IOHEXOL 350 MG/ML SOLN  COMPARISON:  03/10/2013.  FINDINGS: Persistent filling defect is seen in the distal right pulmonary artery with extension into the lobar and segmental pulmonary arteries of the right upper, right middle and right lower lobes. The saddle portion of the pulmonary embolus, previously seen at the bifurcation of the main pulmonary artery, has resolved. A linear band is seen in the left upper lobe pulmonary artery (series 6, images 96-97) and may represent a small web. A thin low-attenuation filling defect persists in the left lower lobe pulmonary artery, at the lobar level (series 6, image 125).  No pathologically enlarged mediastinal, hilar or axillary lymph nodes. Coronary artery calcification. Heart size normal. No pericardial effusion.  Scattered, basilar dependent, airspace disease and/or scarring persist. An 8 x 12 mm nodular density in the superior segment left lower lobe (series 7, image 37) is unchanged. No pleural fluid. Airway is otherwise unremarkable.  Incidental imaging of the upper abdomen shows low attenuation throughout the visualized portion of the liver. No worrisome lytic or sclerotic lesions. Degenerative changes are seen in the spine.  Review of the MIP images confirms the above findings.  IMPRESSION: 1. Persistent, but improved, bilateral pulmonary emboli, as detailed above. No evidence of right heart strain. 2. Scattered bilateral airspace disease and/or scarring, unchanged. 3. Left lower lobe nodule persists. Primary bronchogenic carcinoma is considered. These results were called by telephone at the time of interpretation on 05/18/2013 at 8:17 AM to  Dr. Lynelle Doctor , who verbally acknowledged these results. 4. Hepatic steatosis.   Electronically Signed   By: Leanna Battles M.D.   On: 05/18/2013 08:19        Final diagnoses:  Dyspnea  Hypoxia  Warfarin-induced coagulopathy, initial encounter  Dehydration  Sinus tachycardia  Dizziness   Plan admission   Devoria Albe, MD, FACEP   CRITICAL CARE Performed by: Devoria Albe L Total critical care time: 31 min Critical care time was exclusive of separately billable procedures and treating other patients. Critical care was necessary to treat or prevent imminent or life-threatening deterioration. Critical care was time spent personally by me on the following activities: development of treatment plan with patient and/or surrogate as well as nursing, discussions with consultants, evaluation of patient's response to treatment, examination of patient, obtaining history from patient or surrogate, ordering and performing treatments and interventions, ordering and review of laboratory studies, ordering and review of radiographic studies, pulse oximetry and re-evaluation of patient's condition.    Ward Givens, MD 05/18/13 [phone]

## 2013-05-18 NOTE — ED Notes (Signed)
MD at bedside. 

## 2013-05-18 NOTE — ED Notes (Signed)
Patient transported to CT 

## 2013-05-18 NOTE — ED Notes (Signed)
Ortho at bedside.

## 2013-05-18 NOTE — Progress Notes (Signed)
CRITICAL VALUE ALERT  Critical value received:  INR >10  Date of notification:  05/18/2014  Time of notification:  1445  Critical value read back:yes  Nurse who received alert:  CCM   MD notified (1st page):  Dr. Bonnita Levan  Time of first page:  1455  MD notified (2nd page):  Time of second page:  Responding [id number] Tanja Port  Time MD responded:  1606

## 2013-05-18 NOTE — ED Notes (Addendum)
Pt sts that he gets his Coumadin levels checked regularly.  Sts last check x 3 days ago and level was WDL.

## 2013-05-18 NOTE — ED Provider Notes (Signed)
CSN: [id number]     Arrival date & time 05/18/13  0501 History   First MD Initiated Contact with Patient 05/18/13 [phone]     Chief Complaint  Patient presents with  . Shortness of Breath   (Consider location/radiation/quality/duration/timing/severity/associated sxs/prior Treatment) Patient is a 67 y.o. male presenting with shortness of breath. The history is provided by the patient.  Shortness of Breath Severity:  Moderate Onset quality:  Sudden Duration:  2 hours Timing:  Constant Progression:  Unchanged Chronicity:  New Context comment:  Started when lying down to go to bed at 2am Relieved by:  Nothing Exacerbated by: nothing seems to make it worse.  the same with walking, lying flat or standing. Ineffective treatments:  None tried Associated symptoms: no abdominal pain, no chest pain, no cough, no diaphoresis, no fever, no vomiting and no wheezing   Associated symptoms comment:  Nausea Risk factors: no family hx of DVT, no hx of cancer, no recent surgery and no tobacco use     Past Medical History  Diagnosis Date  . History of blood clots   . Pulmonary edema    History reviewed. No pertinent past surgical history. No family history on file. History  Substance Use Topics  . Smoking status: Never Smoker   . Smokeless tobacco: Not on file  . Alcohol Use: Yes     Comment: social    Review of Systems  Constitutional: Negative for fever and diaphoresis.  Respiratory: Positive for shortness of breath. Negative for cough and wheezing.   Cardiovascular: Negative for chest pain.  Gastrointestinal: Negative for vomiting and abdominal pain.  All other systems reviewed and are negative.    Allergies  Review of patient's allergies indicates no known allergies.  Home Medications   Current Outpatient Rx  Name  Route  Sig  Dispense  Refill  . warfarin (COUMADIN) 2.5 MG tablet   Oral   Take 1 tablet (2.5 mg total) by mouth daily.   20 tablet   0    BP 162/105  Pulse  115  Temp(Src) 98 F (36.7 C) (Oral)  Resp 24  SpO2 91% Physical Exam  Nursing note and vitals reviewed. Constitutional: He is oriented to person, place, and time. He appears well-developed and well-nourished. No distress.  HENT:  Head: Normocephalic and atraumatic.  Mouth/Throat: Oropharynx is clear and moist.  Eyes: Conjunctivae and EOM are normal. Pupils are equal, round, and reactive to light.  Neck: Normal range of motion. Neck supple.  Cardiovascular: Normal rate, regular rhythm and intact distal pulses.   No murmur heard. Pulmonary/Chest: Tachypnea noted. No respiratory distress. He has no wheezes. He has no rhonchi. He has rales in the right lower field and the left lower field.  Abdominal: Soft. He exhibits no distension. There is no tenderness. There is no rebound and no guarding.  Musculoskeletal: Normal range of motion. He exhibits no edema and no tenderness.  Neurological: He is alert and oriented to person, place, and time.  Skin: Skin is warm and dry. No rash noted. No erythema.  Psychiatric: He has a normal mood and affect. His behavior is normal.    ED Course  Procedures (including critical care time) Labs Review Labs Reviewed  CBC WITH DIFFERENTIAL - Abnormal; Notable for the following:    WBC 11.7 (*)    Hemoglobin 17.2 (*)    MCV 100.8 (*)    MCH 36.4 (*)    MCHC 36.1 (*)    RDW 17.0 (*)  Neutrophils Relative % 86 (*)    Neutro Abs 10.0 (*)    Lymphocytes Relative 8 (*)    All other components within normal limits  COMPREHENSIVE METABOLIC PANEL - Abnormal; Notable for the following:    Glucose, Bld 175 (*)    AST 57 (*)    All other components within normal limits  PRO B NATRIURETIC PEPTIDE  URINALYSIS, ROUTINE W REFLEX MICROSCOPIC  PROTIME-INR  POCT I-STAT TROPONIN I   Imaging Review Dg Chest 2 View  05/18/2013   CLINICAL DATA:  New onset shortness of breath and nausea.  EXAM: CHEST  2 VIEW  COMPARISON:  Chest radiograph March 31, 2013.   FINDINGS: Similar minimal bibasilar airspace opacities, elevated right hemidiaphragm. Low inspiratory examination. No pleural effusions. Similar fullness the right pulmonary hilum. Cardiomediastinal silhouette is unremarkable and unchanged. No pneumothorax.  Multiple EKG lines overlie the patient and may obscure subtle underlying pathology. Mild degenerative change of thoracic spine. Soft tissue planes are nonsuspicious.  IMPRESSION: Similar minimal bibasilar airspace opacities favoring atelectasis in this low inspiratory examination. Similar prominence of the right pulmonary hilum could reflect pulmonary arterial hypertension.   Electronically Signed   By: Awilda Metro   On: 05/18/2013 06:14   Dg Finger Thumb Left  05/18/2013   CLINICAL DATA:  Thumb trauma, bruising and discoloration.  EXAM: LEFT THUMB 2+V  COMPARISON:  None available for comparison at time of study interpretation.  FINDINGS: Seen only on one view is a linear lucency through the base of the 1st distal phalanx equivocal for nondisplaced fracture. Mild 1st interphalangeal joint space spurring. 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 No destructive bony lesions. Soft tissue planes are nonsuspicious. Partially imaged plate and screws within the wrist.  IMPRESSION: Possible nondisplaced base of 1st distal phalanx fracture seen on a single view. No dislocation.   Electronically Signed   By: Awilda Metro   On: 05/18/2013 06:19    EKG Interpretation   None       MDM  No diagnosis found.  Pt here due to sudden onset of SOB that started appx 2 hours ago.  Pt with hx of PE on coumadin for the last 6 weeks.  Currently c/o of SOB and nausea.  Denies chest pain/tightness or diaphoresis.  NO risk factors for cardiac dx other than age and no family hx.  Able to speak in complete sentences but c/o of SOB.  Sating 91% on 3L which he does not wear chronically.  On exam pt has rales bilaterally but no signs of peripheral edema.   Tachycardic and no abd pain.  Concern for possible pulm edema vs cardiac etiology.  Possible worsening of PE.  No hx of COPD or asthma.  Denies infectious sx.    CXR, EKG, CBC, CMP, UA, coags pending.  6:34 AM CMP, BNP, troponin all wnl.  EKG with sinus tachy.  CXR with bibasilar airspace opacities and evidence of pulm hypertension.  Will get CTA to r/o worsening clot burden.  Pt checked out to Dr. Lynelle Doctor at 0700.  Gwyneth Sprout, MD 05/22/13 [phone]

## 2013-05-18 NOTE — Progress Notes (Addendum)
ANTICOAGULATION CONSULT NOTE - Initial Consult  Pharmacy Consult for Warfarin Indication: Recent PE, DVT  No Known Allergies  Patient Measurements:    Vital Signs: Temp: 98 F (36.7 C) (12/15 0514) Temp src: Oral (12/15 0514) BP: 111/80 mmHg (12/15 1130) Pulse Rate: 104 (12/15 1130)  Labs:  Recent Labs  05/18/13 0540 05/18/13 0640  HGB 17.2*  --   HCT 47.6  --   PLT 155  --   LABPROT  --  >90.0*  INR  --  >10.00*  CREATININE 0.75  --     The CrCl is unknown because both a height and weight (above a minimum accepted value) are required for this calculation.   Medical History: Past Medical History  Diagnosis Date  . History of blood clots   . Pulmonary edema   . Closed fracture of left wrist     Medications:  Scheduled:  . furosemide  40 mg Intravenous Once  . pantoprazole  40 mg Oral Daily   Infusions:  . 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 Presents 12/15 with acute onset of dyspnea. Chest CT shows decreased clot burden.   INR on admit > 10 - vitamin K 5mg  sq given in ER  Spoke with patient, states his dosage was recently decreased ~1 week ago from 2.5mg  daily to 2mg  daily except 1mg  mondays and wednesdays. Last dose taken 05/16/13.  Hgb high, platelets borderline-low. No bleeding reported  Goal of Therapy:  INR 2-3 Monitor platelets by anticoagulation protocol: Yes   Plan:   No warfarin today  Daily PT/INR  Will resume dosing when INR < 3  Loralee Pacas, PharmD, BCPS Pager: [phone] 05/18/2013,12:20 PM

## 2013-05-18 NOTE — Progress Notes (Signed)
Echo Lab  2D Echocardiogram completed.  Guadalupe Nickless L Tyrice Hewitt, RDCS 05/18/2013 1:12 PM

## 2013-05-18 NOTE — ED Notes (Signed)
Pt's O2 sat is staying between 90-92% RA, while speaking with MD.

## 2013-05-18 NOTE — ED Notes (Signed)
MD notified of ambulation attempt and PT/INR levels.

## 2013-05-18 NOTE — Progress Notes (Signed)
UR completed 

## 2013-05-18 NOTE — ED Notes (Signed)
Pt informed of urine sample, unable to void at the moment

## 2013-05-18 NOTE — ED Notes (Addendum)
I attempted to ambulate patient, patient sat on bed, O2 sat went down to 89%, pulse up to 155. Patient attempted to place feet on floor and stand. Patient was able to place feet on floor and stood, but was very unsteady on his feet and patient sat back on bed, and I alerted the Andrews, RN

## 2013-05-18 NOTE — ED Notes (Signed)
C/o of SOB onset at 0200 with fatigue and nausea. PE 6 weeks. Sat 99% on 3 L , unifocal PVC on monitor. HR 120 sinus tach. Pt  alert, oriented. EMS gave 4mg  zofran. Lung sounds diminish

## 2013-05-19 ENCOUNTER — Telehealth (HOSPITAL_COMMUNITY): Payer: Self-pay | Admitting: *Deleted

## 2013-05-19 ENCOUNTER — Encounter (HOSPITAL_COMMUNITY)
Admission: EM | Disposition: A | Discharge status: Home / Self Care | Payer: Medicare Other | Source: Home / Self Care | Attending: Pulmonary Disease

## 2013-05-19 DIAGNOSIS — I279 Pulmonary heart disease, unspecified: Secondary | ICD-10-CM

## 2013-05-19 HISTORY — PX: RIGHT HEART CATHETERIZATION: SHX5447

## 2013-05-19 LAB — PREPARE FRESH FROZEN PLASMA
Unit division: 0
Unit division: 0

## 2013-05-19 LAB — POCT I-STAT 3, VENOUS BLOOD GAS (G3P V)
Acid-Base Excess: 1 mmol/L (ref 0.0–2.0)
Acid-Base Excess: 4 mmol/L — ABNORMAL HIGH (ref 0.0–2.0)
TCO2: 27 mmol/L (ref 0–100)
pCO2, Ven: 42.4 mmHg — ABNORMAL LOW (ref 45.0–50.0)
pH, Ven: 7.412 — ABNORMAL HIGH (ref 7.250–7.300)
pO2, Ven: 31 mmHg (ref 30.0–45.0)

## 2013-05-19 LAB — COMPREHENSIVE METABOLIC PANEL
ALT: 21 U/L (ref 0–53)
AST: 32 U/L (ref 0–37)
Albumin: 3.4 g/dL — ABNORMAL LOW (ref 3.5–5.2)
Alkaline Phosphatase: 84 U/L (ref 39–117)
Chloride: 98 mEq/L (ref 96–112)
Creatinine, Ser: 0.89 mg/dL (ref 0.50–1.35)
Potassium: 3.2 mEq/L — ABNORMAL LOW (ref 3.5–5.1)
Sodium: 138 mEq/L (ref 135–145)
Total Bilirubin: 1.9 mg/dL — ABNORMAL HIGH (ref 0.3–1.2)
Total Protein: 6.3 g/dL (ref 6.0–8.3)

## 2013-05-19 LAB — CBC
Platelets: 135 10*3/uL — ABNORMAL LOW (ref 150–400)
RBC: 3.8 MIL/uL — ABNORMAL LOW (ref 4.22–5.81)
RDW: 16.9 % — ABNORMAL HIGH (ref 11.5–15.5)
WBC: 7.5 10*3/uL (ref 4.0–10.5)

## 2013-05-19 LAB — PROTIME-INR: INR: 2.09 — ABNORMAL HIGH (ref 0.00–1.49)

## 2013-05-19 LAB — FOLATE RBC: RBC Folate: 460 ng/mL (ref >280)

## 2013-05-19 SURGERY — RIGHT HEART CATH
Anesthesia: LOCAL

## 2013-05-19 MED ORDER — WARFARIN - PHARMACIST DOSING INPATIENT: Freq: Every day | Status: DC

## 2013-05-19 MED ORDER — ONDANSETRON HCL 4 MG/2ML IJ SOLN: 4.0000 mg | Freq: Four times a day (QID) | INTRAMUSCULAR | Status: DC | PRN

## 2013-05-19 MED ORDER — LIDOCAINE HCL (PF) 1 % IJ SOLN
INTRAMUSCULAR | Status: AC
  Filled 2013-05-19: qty 30

## 2013-05-19 MED ORDER — ACETAMINOPHEN 325 MG PO TABS: 650.0000 mg | ORAL_TABLET | ORAL | Status: DC | PRN

## 2013-05-19 MED ORDER — SODIUM CHLORIDE 0.9 % IJ SOLN
3.0000 mL | Freq: Two times a day (BID) | INTRAMUSCULAR | Status: DC
  Administered 2013-05-19 – 2013-05-20 (×2): 3 mL via INTRAVENOUS

## 2013-05-19 MED ORDER — SODIUM CHLORIDE 0.9 % IV SOLN: 250.0000 mL | INTRAVENOUS | Status: DC | PRN

## 2013-05-19 MED ORDER — SODIUM CHLORIDE 0.9 % IJ SOLN: 3.0000 mL | INTRAMUSCULAR | Status: DC | PRN

## 2013-05-19 MED ORDER — SODIUM CHLORIDE 0.9 % IV SOLN: INTRAVENOUS | Status: DC

## 2013-05-19 MED ORDER — SODIUM CHLORIDE 0.9 % IJ SOLN: 3.0000 mL | Freq: Two times a day (BID) | INTRAMUSCULAR | Status: DC

## 2013-05-19 MED ORDER — POTASSIUM CHLORIDE CRYS ER 20 MEQ PO TBCR
40.0000 meq | EXTENDED_RELEASE_TABLET | Freq: Two times a day (BID) | ORAL | Status: AC
  Administered 2013-05-19 (×2): 40 meq via ORAL
  Filled 2013-05-19 (×2): qty 2

## 2013-05-19 MED ORDER — WARFARIN SODIUM 1 MG PO TABS
1.0000 mg | ORAL_TABLET | Freq: Once | ORAL | Status: AC
  Administered 2013-05-19: 19:00:00 1 mg via ORAL
  Filled 2013-05-19: qty 1

## 2013-05-19 MED ORDER — HEPARIN (PORCINE) IN NACL 2-0.9 UNIT/ML-% IJ SOLN
INTRAMUSCULAR | Status: AC
  Filled 2013-05-19: qty 500

## 2013-05-19 NOTE — Progress Notes (Signed)
Pt left via [facility] in route to Heart Cath Lab at [facility]. Consent signed by pt.

## 2013-05-19 NOTE — Interval H&P Note (Signed)
History and Physical Interval Note:  05/19/2013 4:31 PM  Sean Jackson  has presented today for surgery, with the diagnosis of Heart failure  The various methods of treatment have been discussed with the patient and family. After consideration of risks, benefits and other options for treatment, the patient has consented to  Procedure(s): RIGHT HEART CATH (N/A) as a surgical intervention .  The patient's history has been reviewed, patient examined, no change in status, stable for surgery.  I have reviewed the patient's chart and labs.  Questions were answered to the patient's satisfaction.     Brendaliz Kuk

## 2013-05-19 NOTE — H&P (View-Only) (Signed)
PULMONARY  / CRITICAL CARE MEDICINE  Name: Sean Jackson MRN: [id number] DOB: 03/03/1946    ADMISSION DATE:  05/18/2013 CONSULTATION DATE:  12/15  REFERRING MD : Knapp PRIMARY SERVICE:  PCCM  CHIEF COMPLAINT:  Dyspnea   BRIEF PATIENT DESCRIPTION:  This is a 67 year old male who was recently diagnosed w/ unprovoked PE and RLE in October 2014, this was his second VTE event (prior DVT in 2009), hypercoagulable panel negative. Admitted on 12/15 after acute onset of dyspnea early in the am 12/15. Initial eval showed decreased clot burden, but sig tachycardia and desaturation w/ activity. PCCM asked to admit.   SIGNIFICANT EVENTS / STUDIES:  CT chest 12/15: 1. Persistent, but improved, bilateral pulmonary emboli. No evidence of right heart strain.  2. Scattered bilateral airspace disease and/or scarring, unchanged. 3. Left lower lobe nodule persists.4. Hepatic steatosis. ECHO 12/15>>>EF 55 to 60%, grade 1 diastolic dysfx, mild RV dilation, PAS 50 mmHg  SUBJECTIVE:   no distress. Feels better  VITAL SIGNS: Temp:  [98 F (36.7 C)-98.9 F (37.2 C)] 98.3 F (36.8 C) (12/16 0553) Pulse Rate:  [88-115] 88 (12/16 0553) Resp:  [16-26] 16 (12/16 0553) BP: (111-149)/(58-96) 149/71 mmHg (12/16 0553) SpO2:  [88 %-97 %] 97 % (12/16 0553) Weight:  [66.18 kg (145 lb 14.4 oz)-66.361 kg (146 lb 4.8 oz)] 66.18 kg (145 lb 14.4 oz) (12/16 0553) 2 liters  PHYSICAL EXAMINATION: General:  Chronically ill appearing 67 year old male, appears older than stated age  Neuro:  Awake, oriented, no focal def  HEENT:  N/C, atraumatic. Has a large scabbed lesion on nose  Cardiovascular:  rrr Lungs:  No wheeze or rhonchi. Clear  Abdomen:  Soft, non-tender, + bowel sounds  Musculoskeletal:  Intact  Skin:  dry   Recent Labs Lab 05/18/13 0540 05/19/13 0502  NA 138 138  K 3.7 3.2*  CL 97 98  CO2 22 28  BUN 9 9  CREATININE 0.75 0.89  GLUCOSE 175* 112*    Recent Labs Lab 05/18/13 0540 05/18/13 1354  05/19/13 0502  HGB 17.2* 15.4 13.6  HCT 47.6 42.9 39.0  WBC 11.7* 11.0* 7.5  PLT 155 149* 135*   Dg Chest 2 View  05/18/2013   CLINICAL DATA:  New onset shortness of breath and nausea.  EXAM: CHEST  2 VIEW  COMPARISON:  Chest radiograph March 31, 2013.  FINDINGS: Similar minimal bibasilar airspace opacities, elevated right hemidiaphragm. Low inspiratory examination. No pleural effusions. Similar fullness the right pulmonary hilum. Cardiomediastinal silhouette is unremarkable and unchanged. No pneumothorax.  Multiple EKG lines overlie the patient and may obscure subtle underlying pathology. Mild degenerative change of thoracic spine. Soft tissue planes are nonsuspicious.  IMPRESSION: Similar minimal bibasilar airspace opacities favoring atelectasis in this low inspiratory examination. Similar prominence of the right pulmonary hilum could reflect pulmonary arterial hypertension.   Electronically Signed   By: Courtnay  Bloomer   On: 05/18/2013 06:14   Ct Angio Chest Pe W/cm &/or Wo Cm  05/18/2013   CLINICAL DATA:  Shortness of breath, nausea, fatigue. History of pulmonary embolus on 03/10/2013, on anti coagulation.  EXAM: CT ANGIOGRAPHY CHEST WITH CONTRAST  TECHNIQUE: Multidetector CT imaging of the chest was performed using the standard protocol during bolus administration of intravenous contrast. Multiplanar CT image reconstructions including MIPs were obtained to evaluate the vascular anatomy.  CONTRAST:  100mL OMNIPAQUE IOHEXOL 350 MG/ML SOLN  COMPARISON:  03/10/2013.  FINDINGS: Persistent filling defect is seen in the distal right pulmonary artery   with extension into the lobar and segmental pulmonary arteries of the right upper, right middle and right lower lobes. The saddle portion of the pulmonary embolus, previously seen at the bifurcation of the main pulmonary artery, has resolved. A linear band is seen in the left upper lobe pulmonary artery (series 6, images 96-97) and may represent a small  web. A thin low-attenuation filling defect persists in the left lower lobe pulmonary artery, at the lobar level (series 6, image 125).  No pathologically enlarged mediastinal, hilar or axillary lymph nodes. Coronary artery calcification. Heart size normal. No pericardial effusion.  Scattered, basilar dependent, airspace disease and/or scarring persist. An 8 x 12 mm nodular density in the superior segment left lower lobe (series 7, image 37) is unchanged. No pleural fluid. Airway is otherwise unremarkable.  Incidental imaging of the upper abdomen shows low attenuation throughout the visualized portion of the liver. No worrisome lytic or sclerotic lesions. Degenerative changes are seen in the spine.  Review of the MIP images confirms the above findings.  IMPRESSION: 1. Persistent, but improved, bilateral pulmonary emboli, as detailed above. No evidence of right heart strain. 2. Scattered bilateral airspace disease and/or scarring, unchanged. 3. Left lower lobe nodule persists. Primary bronchogenic carcinoma is considered. These results were called by telephone at the time of interpretation on 05/18/2013 at 8:17 AM to Dr. KNAPP , who verbally acknowledged these results. 4. Hepatic steatosis.   Electronically Signed   By: Melinda  Blietz M.D.   On: 05/18/2013 08:19   Dg Finger Thumb Left  05/18/2013   CLINICAL DATA:  Thumb trauma, bruising and discoloration.  EXAM: LEFT THUMB 2+V  COMPARISON:  None available for comparison at time of study interpretation.  FINDINGS: Seen only on one view is a linear lucency through the base of the 1st distal phalanx equivocal for nondisplaced fracture. Mild 1st interphalangeal joint space spurring. 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 No destructive bony lesions. Soft tissue planes are nonsuspicious. Partially imaged plate and screws within the wrist.  IMPRESSION: Possible nondisplaced base of 1st distal phalanx fracture seen on a single view. No dislocation.    Electronically Signed   By: Courtnay  Bloomer   On: 05/18/2013 06:19    ASSESSMENT / PLAN: A: Exertional Hypoxia and dyspnea. This is likely multifactorial:  Pulmonary emboli  Recent Saddle PE (october 2014). This is his second unprovoked VTE. Hypercoagulable panel was negative. Comparative CT scans from 10/7 to 12/15 show progressively decreased clot burden. Has been taking coumadin as prescribed, in fact is hypercoagulable. 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  pulmonary edema. Plan Needs right heart cath to further evaluate PAH Pulse ox Will need walking oximetry prior to d/x   A: supra-therapeutic INR (coumadin induced), got 2 units FFP 12/15, INR now 2 Plan Warfarin per pharmacy  A: mild sinus tachycardia: think this is due to exertion. No evidence of ischemia Plan Admit to tele   A: LLL lung nodule Plan Currently getting active treatment for PE. Given the fact that there is still sig clot burden (although improved), would repeat CT scan in 3 months. If still persistent would consider CT guided Bx.   A: mild Hyperglycemia Plan Ck glucose qid   A: Mild transaminates. C/w ETOH related  improved Plan F/u lft PRN    A: Hypokalemia  Plan: Replace and recheck  A: Macrocytosis w/ mild polycythemia  He admits to drinking at least one large glass of liquor a week >B12 nml  Plan -f/u folate  -supplemental oxygen    Pulmonary and Critical Care Medicine [facility] Pager: [phone]  05/19/2013, 10:18 AM BABCOCK,PETE   Reviewed above, examined pt, and agree with assessment/plan.  Will arrange for Rt heart cath to assess PA pressures, and then determine if he needs intervention for pulmonary artery hypertension in setting of chronic  thrombo-embolic disease.  Nakiya Rallis, MD [facility] 05/19/2013, 1:21 PM Pager:  [phone] After 3pm call: [phone]  

## 2013-05-19 NOTE — Progress Notes (Signed)
PULMONARY  / CRITICAL CARE MEDICINE  Name: Sean Jackson MRN: [id number] DOB: 1945/09/06    ADMISSION DATE:  05/18/2013 CONSULTATION DATE:  12/15  REFERRING MD : Lynelle Doctor PRIMARY SERVICE:  PCCM  CHIEF COMPLAINT:  Dyspnea   BRIEF PATIENT DESCRIPTION:  This is a 67 year old male who was recently diagnosed w/ unprovoked PE and RLE in October 2014, this was his second VTE event (prior DVT in 2009), hypercoagulable panel negative. Admitted on 12/15 after acute onset of dyspnea early in the am 12/15. Initial eval showed decreased clot burden, but sig tachycardia and desaturation w/ activity. PCCM asked to admit.   SIGNIFICANT EVENTS / STUDIES:  CT chest 12/15: 1. Persistent, but improved, bilateral pulmonary emboli. No evidence of right heart strain.  2. Scattered bilateral airspace disease and/or scarring, unchanged. 3. Left lower lobe nodule persists.4. Hepatic steatosis. ECHO 12/15>>>EF 55 to 60%, grade 1 diastolic dysfx, mild RV dilation, PAS 50 mmHg  SUBJECTIVE:   no distress. Feels better  VITAL SIGNS: Temp:  [98 F (36.7 C)-98.9 F (37.2 C)] 98.3 F (36.8 C) (12/16 0553) Pulse Rate:  [88-115] 88 (12/16 0553) Resp:  [16-26] 16 (12/16 0553) BP: (111-149)/(58-96) 149/71 mmHg (12/16 0553) SpO2:  [88 %-97 %] 97 % (12/16 0553) Weight:  [66.18 kg (145 lb 14.4 oz)-66.361 kg (146 lb 4.8 oz)] 66.18 kg (145 lb 14.4 oz) (12/16 0553) 2 liters  PHYSICAL EXAMINATION: General:  Chronically ill appearing 67 year old male, appears older than stated age  Neuro:  Awake, oriented, no focal def  HEENT:  N/C, atraumatic. Has a large scabbed lesion on nose  Cardiovascular:  rrr Lungs:  No wheeze or rhonchi. Clear  Abdomen:  Soft, non-tender, + bowel sounds  Musculoskeletal:  Intact  Skin:  dry   Recent Labs Lab 05/18/13 0540 05/19/13 0502  NA 138 138  K 3.7 3.2*  CL 97 98  CO2 22 28  BUN 9 9  CREATININE 0.75 0.89  GLUCOSE 175* 112*    Recent Labs Lab 05/18/13 0540 05/18/13 1354  05/19/13 0502  HGB 17.2* 15.4 13.6  HCT 47.6 42.9 39.0  WBC 11.7* 11.0* 7.5  PLT 155 149* 135*   Dg Chest 2 View  05/18/2013   CLINICAL DATA:  New onset shortness of breath and nausea.  EXAM: CHEST  2 VIEW  COMPARISON:  Chest radiograph March 31, 2013.  FINDINGS: Similar minimal bibasilar airspace opacities, elevated right hemidiaphragm. Low inspiratory examination. No pleural effusions. Similar fullness the right pulmonary hilum. Cardiomediastinal silhouette is unremarkable and unchanged. No pneumothorax.  Multiple EKG lines overlie the patient and may obscure subtle underlying pathology. Mild degenerative change of thoracic spine. Soft tissue planes are nonsuspicious.  IMPRESSION: Similar minimal bibasilar airspace opacities favoring atelectasis in this low inspiratory examination. Similar prominence of the right pulmonary hilum could reflect pulmonary arterial hypertension.   Electronically Signed   By: Awilda Metro   On: 05/18/2013 06:14   Ct Angio Chest Pe W/cm &/or Wo Cm  05/18/2013   CLINICAL DATA:  Shortness of breath, nausea, fatigue. History of pulmonary embolus on 03/10/2013, on anti coagulation.  EXAM: CT ANGIOGRAPHY CHEST WITH CONTRAST  TECHNIQUE: Multidetector CT imaging of the chest was performed using the standard protocol during bolus administration of intravenous contrast. Multiplanar CT image reconstructions including MIPs were obtained to evaluate the vascular anatomy.  CONTRAST:  OMNIPAQUE IOHEXOL 350 MG/ML SOLN  COMPARISON:  03/10/2013.  FINDINGS: Persistent filling defect is seen in the distal right pulmonary artery  with extension into the lobar and segmental pulmonary arteries of the right upper, right middle and right lower lobes. The saddle portion of the pulmonary embolus, previously seen at the bifurcation of the main pulmonary artery, has resolved. A linear band is seen in the left upper lobe pulmonary artery (series 6, images 96-97) and may represent a small  web. A thin low-attenuation filling defect persists in the left lower lobe pulmonary artery, at the lobar level (series 6, image 125).  No pathologically enlarged mediastinal, hilar or axillary lymph nodes. Coronary artery calcification. Heart size normal. No pericardial effusion.  Scattered, basilar dependent, airspace disease and/or scarring persist. An 8 x 12 mm nodular density in the superior segment left lower lobe (series 7, image 37) is unchanged. No pleural fluid. Airway is otherwise unremarkable.  Incidental imaging of the upper abdomen shows low attenuation throughout the visualized portion of the liver. No worrisome lytic or sclerotic lesions. Degenerative changes are seen in the spine.  Review of the MIP images confirms the above findings.  IMPRESSION: 1. Persistent, but improved, bilateral pulmonary emboli, as detailed above. No evidence of right heart strain. 2. Scattered bilateral airspace disease and/or scarring, unchanged. 3. Left lower lobe nodule persists. Primary bronchogenic carcinoma is considered. These results were called by telephone at the time of interpretation on 05/18/2013 at 8:17 AM to Dr. Lynelle Doctor , who verbally acknowledged these results. 4. Hepatic steatosis.   Electronically Signed   By: Leanna Battles M.D.   On: 05/18/2013 08:19   Dg Finger Thumb Left  05/18/2013   CLINICAL DATA:  Thumb trauma, bruising and discoloration.  EXAM: LEFT THUMB 2+V  COMPARISON:  None available for comparison at time of study interpretation.  FINDINGS: Seen only on one view is a linear lucency through the base of the 1st distal phalanx equivocal for nondisplaced fracture. Mild 1st interphalangeal joint space spurring. 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 No destructive bony lesions. Soft tissue planes are nonsuspicious. Partially imaged plate and screws within the wrist.  IMPRESSION: Possible nondisplaced base of 1st distal phalanx fracture seen on a single view. No dislocation.    Electronically Signed   By: Awilda Metro   On: 05/18/2013 06:19    ASSESSMENT / PLAN: A: Exertional Hypoxia and dyspnea. This is likely multifactorial:  Pulmonary emboli  Recent Saddle PE (october 2014). This is his second unprovoked VTE. Hypercoagulable panel was negative. Comparative CT scans from 10/7 to 12/15 show progressively decreased clot burden. Has been taking coumadin as prescribed, in fact is hypercoagulable. 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  pulmonary edema. Plan Needs right heart cath to further evaluate PAH Pulse ox Will need walking oximetry prior to d/x   A: supra-therapeutic INR (coumadin induced), got 2 units FFP 12/15, INR now 2 Plan Warfarin per pharmacy  A: mild sinus tachycardia: think this is due to exertion. No evidence of ischemia Plan Admit to tele   A: LLL lung nodule Plan Currently getting active treatment for PE. Given the fact that there is still sig clot burden (although improved), would repeat CT scan in 3 months. If still persistent would consider CT guided Bx.   A: mild Hyperglycemia Plan Ck glucose qid   A: Mild transaminates. C/w ETOH related  improved Plan F/u lft PRN  A: Hypokalemia  Plan: Replace and recheck  A: Macrocytosis w/ mild polycythemia  He admits to drinking at least one large glass of liquor a week >B12 nml  Plan -f/u folate  -supplemental oxygen    Pulmonary and Critical Care Medicine [facility] Pager: [phone]  05/19/2013, 10:18 AM BABCOCK,PETE   Reviewed above, examined pt, and agree with assessment/plan.  Will arrange for Rt heart cath to assess PA pressures, and then determine if he needs intervention for pulmonary artery hypertension in setting of chronic  thrombo-embolic disease.  Coralyn Helling, MD [facility] Pulmonary/Critical Care 05/19/2013, 1:21 PM Pager:  [phone] After 3pm call: [phone]

## 2013-05-19 NOTE — Progress Notes (Signed)
Pt returned to room 1407 via [facility]. Right neck dressing intact, clean, and dry.

## 2013-05-19 NOTE — Progress Notes (Signed)
ANTICOAGULATION CONSULT NOTE - Follow Up Consult  Pharmacy Consult for warfarin Indication: recent saddle PE, DVT  No Known Allergies  Patient Measurements: Height: 5\' 6"  (167.6 cm) Weight: 145 lb 14.4 oz (66.18 kg) IBW/kg (Calculated) : 63.8 Heparin Dosing Weight:   Vital Signs: Temp: 98.3 F (36.8 C) (12/16 0553) Temp src: Oral (12/16 0553) BP: 149/71 mmHg (12/16 0553) Pulse Rate: 88 (12/16 0553)  Labs:  Recent Labs  05/18/13 0540 05/18/13 0640 05/18/13 1354 05/19/13 0502  HGB 17.2*  --  15.4 13.6  HCT 47.6  --  42.9 39.0  PLT 155  --  149* 135*  LABPROT  --  >90.0* >90.0* 22.8*  INR  --  >10.00* >10.00* 2.09*  CREATININE 0.75  --   --  0.89    Estimated Creatinine Clearance: 70.7 ml/min (by C-G formula based on Cr of 0.89).   Assessment: 46 yom recently diagnosed w/ unprovoked PE and RLE DVT in October 2014, this was his second VTE event (prior DVT in 2009), hypercoagulable panel negative. Presents 12/15 with acute onset of dyspnea. Chest CT shows decreased clot burden. INR > 10 at admission  Home warfarin dosing: 2mg  daily except 1mg  on MW (recent dose reduction)   Todays INR = 2.09 s/p vitamin K 5mg  SQ and FFP 12/15  CBC: Hgb = 13.6, platelets = 135 (trending down from admission)   Goal of Therapy:  INR 2-3 Monitor platelets by anticoagulation protocol: Yes   Plan:   INR therapeutic following Vit K and FFP, give warfarin 1mg  x 1 tonight  Suspect may see some resistance due to vitamin K administration  If INR falls below 2, consider enoxaparin bridge  CBC in am to monitor platelets  Juliette Alcide, PharmD, BCPS.   Pager: [phone]  05/19/2013,8:16 AM

## 2013-05-19 NOTE — CV Procedure (Signed)
Cardiac Cath Procedure Note:  Indication:  Pulmonary emboli with pulmonary HTN on echo  Procedures performed:  1) Right heart catheterization  Description of procedure:   The risks and indication of the procedure were explained. Consent was signed and placed on the chart. An appropriate timeout was taken prior to the procedure. The right neck was prepped and draped in the routine sterile fashion and anesthetized with 1% local lidocaine.   A 7 FR venous sheath was placed in the right internal jugular vein using a modified Seldinger technique. A standard Swan-Ganz catheter was used for the procedure.   Complications: None apparent.  Findings:  RA =  2 RV =  34/3/5 PA =  35/11 (20)  PCW = 5 Fick cardiac output/index = 3.3/1.9 PVR = 4.5 WU O2 sat = 92% PA sat = 62%, 62%  Assessment:  Mildly elevated R-sided pressures with elevated PVR and moderately reduced cardiac output  Plan/discussion:  Continue treatment for PE. No role for selective pulmonary vasodilators at this point. Suspect hemodynamics will improve over time. Given recurrent VTE would consider IVC filter.   Daniel Bensimhon,MD 4:54 PM

## 2013-05-20 ENCOUNTER — Telehealth: Payer: Self-pay | Admitting: Emergency Medicine

## 2013-05-20 DIAGNOSIS — R1310 Dysphagia, unspecified: Secondary | ICD-10-CM

## 2013-05-20 LAB — CBC
Hemoglobin: 14.1 g/dL (ref 13.0–17.0)
MCH: 37 pg — ABNORMAL HIGH (ref 26.0–34.0)
MCHC: 35.6 g/dL (ref 30.0–36.0)
MCV: 103.9 fL — ABNORMAL HIGH (ref 78.0–100.0)
Platelets: 122 10*3/uL — ABNORMAL LOW (ref 150–400)
RBC: 3.81 MIL/uL — ABNORMAL LOW (ref 4.22–5.81)
RDW: 16.8 % — ABNORMAL HIGH (ref 11.5–15.5)

## 2013-05-20 LAB — PROTIME-INR
INR: 1.32 (ref 0.00–1.49)
Prothrombin Time: 16.1 seconds — ABNORMAL HIGH (ref 11.6–15.2)

## 2013-05-20 MED ORDER — RIVAROXABAN 15 MG PO TABS: 15.0000 mg | ORAL_TABLET | Freq: Every day | ORAL | Status: DC

## 2013-05-20 MED ORDER — RIVAROXABAN 15 MG PO TABS: 15.0000 mg | ORAL_TABLET | Freq: Two times a day (BID) | ORAL | Status: DC

## 2013-05-20 MED ORDER — RIVAROXABAN (XARELTO) EDUCATION KIT FOR DVT/PE PATIENTS
PACK | Freq: Once | Status: AC
  Administered 2013-05-20: 12:00:00
  Filled 2013-05-20: qty 1

## 2013-05-20 MED ORDER — RIVAROXABAN 20 MG PO TABS: 20.0000 mg | ORAL_TABLET | Freq: Every day | ORAL | Status: DC

## 2013-05-20 NOTE — Discharge Summary (Signed)
Discharge Summary   Patient ID: Sean Jackson MRN: [id number] DOB/AGE: 69-Jul-1947 37 y.o.  Admit date: 05/18/2013 Discharge date: 05/20/2013  Discharge Diagnoses:  Principal Problem:   Hypoxemia Active Problems:   Saddle pulmonary embolus   Dyspnea   Alcohol abuse fatty liver disease  Macrocytosis  Lung Nodule  Detailed Hospital Course:   67 year old male followed by RB for unprovoked saddle PE and R LE DVT back on 10/7 (hypercoagulable panel was negative, but this was his second VTE event as had DVT of LLE in 2009) . He was seen in f/u at our office on 11/3 and 12/3 and noted to have exertional desaturation down to 85% on room air. Was in usual state of health until ~2am 12/15 when noticed rather sudden onset of increased exertional dyspnea. Denied pain, did think he had some light headedness and 1 event of near syncope which he said he fell to floor. No fever, chills, cough, no pleuritic chest discomfort. Placed himself on oxygen. Didn't really help much. Because of this he presented to the ER. In ER work up included CT chest that demonstrated decreased clot burden of his recent PEs, and bibasilar airspace disease, w/ LLL pulmonary nodule. Pulm asked to admit as he desaturated during ambulation and became tachycardic.   We admitted him to the telemetry unit. Diagnostic evaluation included: ECHO which showed grade I diastolic dysfunction, EF 55-60% , with mild RV dilation and PAS of 50 mmHg. Due to his CT findings that looked like webbing changes on CT we were concerned about PAH in the setting of chronic thromboembolic disease and because of this we asked cardiology to preform a right heart catheterization. This was done on 12/16 with the following results: RA = 2  RV = 34/3/5 , PA = 35/11 (20) ,PCW = 5 , Fick cardiac output/index = 3.3/1.9 , PVR = 4.5 WU , O2 sat = 92%  PA sat = 62%, 62%. With his Mean PAP by right heart cath of 20 mmHg there was no diagnostic criteria for PAH. He did  have some mild elevation in PVR c/w recent PE. PCWP suggested euvolemic. Because of these finding we re-evaluated his CT scan specifically the non-specific pulmonary infiltrates in the bases. We feel these likely represent chronic aspiration as opposed to interstitial lung disease changes. He is clinically improved primarily with supplemental oxygen and really no other therapeutic intervention other than a 1 time dose of lasix. At time of discharge his PCWP is only  so it does not appear like scheduled diuretics are warranted.   In regards to his Pulmonary emboli: his clot burden is decreasing. We have already established that he will be a life long anticoagulant patient. The challenge for Sean Jackson has been maintaining his INR within a therapeutic range. We had a long risk/benefit discussion and based on this the patient has agreed to switch his mode of anticoagulation to xarelto. We checked walking oximetry prior to discharge with the following findings: His resting oxygen sat was 94% on room air. With ambulation he dropped to 83% on room air, but when placed on 2 liters he is able to maintain saturations > 94%. He is cleared for discharge to home with the following plan as outlined below.   Discharge plan by active diagnosis   Exertional Hypoxia and dyspnea. This is likely multifactorial:  Pulmonary emboli Recent Saddle PE (october 2014). This is his second unprovoked VTE. Hypercoagulable panel was negative. Comparative CT scans from 10/7 to  12/15 show progressively decreased clot burden. Has been taking coumadin as prescribed, in fact is hypercoagulable. 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  Grade I diastolic dysfunction: w/ Right heart cath: PAP , pcwp: 5, RA 2.  Probable chronic aspiration.   Plan  Convert to xarelto life long  Oxygen with exertion 2 liters  Follow up MBS No role for diuretics   LLL lung nodule  Plan  Currently getting active treatment for  PE. Given the fact that there is still sig clot burden (although improved), would repeat CT scan in 3 months. If still persistent would consider CT guided Bx.   Macrocytosis w/ mild polycythemia  He admits to drinking at least one large glass of liquor a week  >B12 and folate nml  Plan  Monitor   Fatty liver disease, and h/o ETOH Plan Advised on ETOH cessation   Significant Hospital tests/ studies/ interventions and procedures  Consults Cardiology  CT chest 12/15: 1. Persistent, but improved, bilateral pulmonary emboli. No evidence of right heart strain.  2. Scattered bilateral airspace disease and/or scarring, unchanged. 3. Left lower lobe nodule persists.4. Hepatic steatosis.  ECHO 12/15>>>EF 55 to 60%, grade 1 diastolic dysfx, mild RV dilation, PAS 50 mmHg Right heart cath 12/17: Findings: RA = 2 RV = 34/3/5 PA = 35/11 (20) PCW = 5 Fick cardiac output/index = 3.3/1.9  PVR = 4.5 WU O2 sat = 92% PA sat = 62%, 62%  Assessment:  Mildly elevated R-sided pressures with elevated PVR and moderately reduced cardiac output   Discharge Exam: BP 142/88  Pulse 86  Temp(Src) 98.6 F (37 C) (Oral)  Resp 16  Ht 5\' 6"  (1.676 m)  Wt 67.5 kg (148 lb 13 oz)  BMI 24.03 kg/m2  SpO2 97% Room air  PHYSICAL EXAMINATION:  General: Chronically ill appearing 67 year old male, appears older than stated age  Neuro: Awake, oriented, no focal def  HEENT: N/C, atraumatic. Has a large scabbed lesion on nose  Cardiovascular: rrr  Lungs: No wheeze or rhonchi. Clear  Abdomen: Soft, non-tender, + bowel sounds  Musculoskeletal: Intact  Skin: dry  Labs at discharge Lab Results  Component Value Date   CREATININE 0.89 05/19/2013   BUN 9 05/19/2013   NA 138 05/19/2013   K 3.2* 05/19/2013   CL 98 05/19/2013   CO2 28 05/19/2013   Lab Results  Component Value Date   WBC 8.0 05/20/2013   HGB 14.1 05/20/2013   HCT 39.6 05/20/2013   MCV 103.9* 05/20/2013   PLT 122* 05/20/2013   Lab Results    Component Value Date   ALT 21 05/19/2013   AST 32 05/19/2013   ALKPHOS 84 05/19/2013   BILITOT 1.9* 05/19/2013   Lab Results  Component Value Date   INR 1.32 05/20/2013   INR 2.09* 05/19/2013   INR >10.00* 05/18/2013    Current radiology studies No results found.  Disposition:  06-Home-Health Care Svc      Discharge Orders   Future Appointments Provider Department Dept Phone   06/11/2013 4:00 PM Leslye Peer, MD [facility] [phone]   Future Orders Complete By Expires   Face-to-face encounter (required for Medicare/Medicaid patients)  As directed    Comments:     I Cortney Beissel,PETE certify that this patient is under my care and that I, or a nurse practitioner or physician's assistant working with me, had a face-to-face encounter that meets the physician face-to-face encounter requirements with this patient on 05/20/2013. The encounter  with the patient was in whole, or in part for the following medical condition(s) which is the primary reason for home health care (List medical condition): physical deconditioning   Questions:     The encounter with the patient was in whole, or in part, for the following medical condition, which is the primary reason for home health care:  deconditioning   I certify that, based on my findings, the following services are medically necessary home health services:  Physical therapy   My clinical findings support the need for the above services:  Shortness of breath with activity   Further, I certify that my clinical findings support that this patient is homebound due to:  Ambulates short distances less than 300 feet   Reason for Medically Necessary Home Health Services:  Skilled Nursing- Change/Decline in Patient Status   For home use only DME oxygen  As directed    Comments:     With activity   Questions:     Mode or (Route):  Nasal cannula   Liters per Minute:  2   Frequency:     Oxygen conserving device:  No   Home Health  As  directed    Questions:     To provide the following care/treatments:  PT       Medication List    STOP taking these medications       warfarin 2 MG tablet  Commonly known as:  COUMADIN      TAKE these medications       acetaminophen 500 MG tablet  Commonly known as:  TYLENOL  Take 1,000 mg by mouth every 8 (eight) hours as needed for mild pain or headache.     Rivaroxaban 15 MG Tabs tablet  Commonly known as:  XARELTO  Take 1 tablet (15 mg total) by mouth 2 (two) times daily with a meal.     Rivaroxaban 20 MG Tabs tablet  Commonly known as:  XARELTO  Take 1 tablet (20 mg total) by mouth daily with supper.  Start taking on:  06/11/2013       Follow-up Information   Follow up with Leslye Peer., MD On 06/11/2013. (4pm. We will call you with date/time and instructions for your swallowing study)    Specialty:  Pulmonary Disease   Contact information:   [address] [place] [address] [phone]       Discharged Condition: good  Physician Statement:   The Patient was personally examined, the discharge assessment and plan has been personally reviewed and I agree with ACNP Osamah Schmader's assessment and plan. > 30 minutes of time have been dedicated to discharge assessment, planning and discharge instructions.   Signed: Danell Vazquez,PETE 05/20/2013, 11:42 AM

## 2013-05-20 NOTE — Evaluation (Signed)
Physical Therapy Evaluation- 1x eval Patient Details Name: Sean Jackson MRN: [id number] DOB: Nov 24, 1945 Today's Date: 05/20/2013 Time: 7829-5621 PT Time Calculation (min): 15 min  PT Assessment / Plan / Recommendation History of Present Illness  67 yo male admitted with dyspnea, PE, pulm HTN, high INR. s/p heart cath 12/16. Recent admit/discharge from hospital ~2 months ago  Clinical Impression  On eval, pt required Min guard assist for mobility-able to ambulate ~175 feet with walker. Recommend HHPT for gait and balance training. Recommend RW use-pt states he will borrow walker from mother. Plan is for d/c home later today.     PT Assessment  All further PT needs can be met in the next venue of care    Follow Up Recommendations  Home health PT    Does the patient have the potential to tolerate intense rehabilitation      Barriers to Discharge        Equipment Recommendations   (pt states he will borrow mother's walker)    Recommendations for Other Services     Frequency      Precautions / Restrictions Precautions Precautions: Fall Required Braces or Orthoses: Other Brace/Splint Other Brace/Splint: L wrist splint. Pt reports broken wrist/hand.  Restrictions Weight Bearing Restrictions: No   Pertinent Vitals/Pain No c/o pain      Mobility  Bed Mobility Bed Mobility: Supine to Sit;Sit to Supine Supine to Sit: 6: Modified independent (Device/Increase time);HOB elevated Sit to Supine: 6: Modified independent (Device/Increase time);HOB elevated Transfers Transfers: Sit to Stand;Stand to Sit Sit to Stand: 4: Min guard;From bed Stand to Sit: 4: Min guard;To bed Ambulation/Gait Ambulation/Gait Assistance: 4: Min guard Ambulation Distance (Feet): 175 Feet Assistive device: Rolling walker Ambulation/Gait Assistance Details: close guard for safety. Noted mild foot slap bilaterally during gait. Pt reports hx of peripheral neuropathy.  Gait Pattern: Step-through pattern     Exercises     PT Diagnosis: Generalized weakness;Difficulty walking  PT Problem List: Decreased balance;Decreased mobility;Decreased activity tolerance;Decreased strength;Decreased knowledge of use of DME PT Treatment Interventions:       PT Goals(Current goals can be found in the care plan section) Acute Rehab PT Goals Patient Stated Goal: regain independence PT Goal Formulation: No goals set, d/c therapy  Visit Information  Last PT Received On: 05/20/13 Assistance Needed: +1 History of Present Illness: 67 yo male admitted with dyspnea, PE, pulm HTN, high INR. s/p heart cath 12/16. Recent admit/discharge from hospital ~2 months ago       Prior Functioning  Home Living Family/patient expects to be discharged to:: Private residence Living Arrangements: Other relatives Available Help at Discharge: Family Type of Home: Apartment Home Access: Level entry Home Layout: One level Home Equipment: None Additional Comments: will borrow walker from mother Prior Function Level of Independence: Independent Communication Communication: No difficulties    Cognition  Cognition Arousal/Alertness: Awake/alert Behavior During Therapy: WFL for tasks assessed/performed Overall Cognitive Status: Within Functional Limits for tasks assessed    Extremity/Trunk Assessment Upper Extremity Assessment Upper Extremity Assessment: Overall WFL for tasks assessed;LUE deficits/detail LUE Deficits / Details: L wrist/hand splint in place Lower Extremity Assessment Lower Extremity Assessment: Generalized weakness Cervical / Trunk Assessment Cervical / Trunk Assessment: Normal   Balance    End of Session PT - End of Session Equipment Utilized During Treatment: Gait belt;Oxygen Activity Tolerance: Patient tolerated treatment well Patient left: in bed;with call bell/phone within reach  GP     Rebeca Alert, MPT Pager: [phone]

## 2013-05-20 NOTE — Telephone Encounter (Signed)
Per d/c: Follow up with Leslye Peer., MD On 06/11/2013. (4pm. We will call you with date/time and instructions for your swallowing study)  ---  Please advise RB if okay to order? thanks

## 2013-05-20 NOTE — Care Management Note (Signed)
    Page 1 of 1   05/20/2013     12:33:43 PM   CARE MANAGEMENT NOTE 05/20/2013  Patient:  TABB, CROGHAN   Account Number:  [id number]  Date Initiated:  05/20/2013  Documentation initiated by:  Lanier Clam  Subjective/Objective Assessment:   67 Y/O M ADMITTED W/PE.     Action/Plan:   FROM HOME.HAS HOME 02-[facility].   Anticipated DC Date:  05/20/2013   Anticipated DC Plan:  HOME W HOME HEALTH SERVICES      DC Planning Services  CM consult      Choice offered to / List presented to:  C-1 Patient        HH arranged  HH-2 PT      [facility] agency  [facility]   Status of service:  Completed, signed off Medicare Important Message given?   (If response is "NO", the following Medicare IM given date fields will be blank) Date Medicare IM given:   Date Additional Medicare IM given:    Discharge Disposition:  HOME W HOME HEALTH SERVICES  Per UR Regulation:  Reviewed for med. necessity/level of care/duration of stay  If discussed at Long Length of Stay Meetings, dates discussed:    Comments:  05/20/13 Demetrion Wesby RN,BSN NCM [phone] EXPLORED SCRIPT COVERAGE.PATIENT DOES NOT HAVE SCRIPT COVERAGE.XARELTO EDUC KIT/COUPON ENCLOSED  PER PHARMACY.PATIENT DOES NOT HAVE A TRAVEL TANK TO BRING TO HOSPITAL FOR D/C.TC [facility] DME REP-WILL BRING TRAVEL TANK TO RM.HHPT ORDERED [facility] CHOSEN.TC REP AWARE OF D/C & ORDER.

## 2013-05-20 NOTE — Progress Notes (Signed)
ANTICOAGULATION CONSULT NOTE - Follow Up Consult  Pharmacy Consult for warfarin Indication: recent saddle PE, DVT  No Known Allergies  Labs:  Recent Labs  05/18/13 0540  05/18/13 1354 05/19/13 0502 05/20/13 1007  HGB 17.2*  --  15.4 13.6 14.1  HCT 47.6  --  42.9 39.0 39.6  PLT 155  --  149* 135* 122*  LABPROT  --   < > >90.0* 22.8* 16.1*  INR  --   < > >10.00* 2.09* 1.32  CREATININE 0.75  --   --  0.89  --   < > = values in this interval not displayed.  Estimated Creatinine Clearance: 70.7 ml/min (by C-G formula based on Cr of 0.89).   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 Presents 12/15 with acute onset of dyspnea. Chest CT shows decreased clot burden. INR > 10 at admission  Home warfarin dosing: 2mg  daily except 1mg  on MW (recent dose reduction)   Todays INR = 1.32 s/p vitamin K 5mg  SQ and FFP 12/15  CBC: Hgb = 14.1, platelets = 122 (trending down from admission)  Goal of Therapy:  INR 2-3 Monitor platelets by anticoagulation protocol: Yes   Plan:   Spoke with pulmonary, plan to transition to xarelto tonight based on INR < 2. Plan is discharge today.    D/C warfarin per pharmacy consult and daily INR  Will provide new medication counseling  Juliette Alcide, PharmD, BCPS.   Pager: [phone]  05/20/2013,10:56 AM

## 2013-05-20 NOTE — Discharge Summary (Signed)
MCQUAID, DOUGLAS [place] PCCM Pager: [phone] Cell: [phone] If no response, call [phone]  

## 2013-05-20 NOTE — Telephone Encounter (Signed)
Yes go ahead and order.

## 2013-05-20 NOTE — Progress Notes (Signed)
PULMONARY  / CRITICAL CARE MEDICINE  Name: Sean Jackson MRN: [id number] DOB: January 22, 1946    ADMISSION DATE:  05/18/2013 CONSULTATION DATE:  12/15  REFERRING MD : Lynelle Doctor PRIMARY SERVICE:  PCCM  CHIEF COMPLAINT:  Dyspnea   BRIEF PATIENT DESCRIPTION:  This is a 67 year old male who was recently diagnosed w/ unprovoked PE and RLE in October 2014, this was his second VTE event (prior DVT in 2009), hypercoagulable panel negative. Admitted on 12/15 after acute onset of dyspnea early in the am 12/15. Initial eval showed decreased clot burden, but sig tachycardia and desaturation w/ activity. PCCM asked to admit.   SIGNIFICANT EVENTS / STUDIES:  CT chest 12/15: 1. Persistent, but improved, bilateral pulmonary emboli. No evidence of right heart strain.  2. Scattered bilateral airspace disease and/or scarring, unchanged. 3. Left lower lobe nodule persists.4. Hepatic steatosis. ECHO 12/15>>>EF 55 to 60%, grade 1 diastolic dysfx, mild RV dilation, PAS 50 mmHg  SUBJECTIVE:   no distress. Feels better  VITAL SIGNS: Temp:  [98.2 F (36.8 C)-98.9 F (37.2 C)] 98.6 F (37 C) (12/17 0556) Pulse Rate:  [82-104] 86 (12/17 0556) Resp:  [16-26] 16 (12/17 0556) BP: (121-144)/(55-88) 142/88 mmHg (12/17 0556) SpO2:  [94 %-97 %] 97 % (12/17 0556) Weight:  [67.5 kg (148 lb 13 oz)] 67.5 kg (148 lb 13 oz) (12/17 0603) 2 liters  PHYSICAL EXAMINATION: General:  Chronically ill appearing 67 year old male, appears older than stated age  Neuro:  Awake, oriented, no focal def  HEENT:  N/C, atraumatic. Has a large scabbed lesion on nose  Cardiovascular:  rrr Lungs:  No wheeze or rhonchi. Clear  Abdomen:  Soft, non-tender, + bowel sounds  Musculoskeletal:  Intact  Skin:  dry   Recent Labs Lab 05/18/13 0540 05/19/13 0502  NA 138 138  K 3.7 3.2*  CL 97 98  CO2 22 28  BUN 9 9  CREATININE 0.75 0.89  GLUCOSE 175* 112*    Recent Labs Lab 05/18/13 0540 05/18/13 1354 05/19/13 0502  HGB 17.2* 15.4  13.6  HCT 47.6 42.9 39.0  WBC 11.7* 11.0* 7.5  PLT 155 149* 135*   No results found.  ASSESSMENT / PLAN: A: Exertional Hypoxia and dyspnea. This is likely multifactorial:  Pulmonary emboli  Recent Saddle PE (october 2014). This is his second unprovoked VTE. Hypercoagulable panel was negative. Comparative CT scans from 10/7 to 12/15 show progressively decreased clot burden. Has been taking coumadin as prescribed, in fact is hypercoagulable. 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  Grade I diastolic dysfunction.  pulmonary edema. His Mean PAP by right heart cath  is 20 mmHg so no PAH, did have some mild elevation in PVR c/w recent PE. PCWP suggests euvolemic Plan Pulse ox Will need walking oximetry prior to d/x  Cont coumadin   A: supra-therapeutic INR (coumadin induced), got 2 units FFP 12/15, INR now 2 Plan Warfarin per pharmacy  A: mild sinus tachycardia: think this is due to exertion. No evidence of ischemia Plan Admit to tele   A: LLL lung nodule Plan Currently getting active treatment for PE. Given the fact that there is still sig clot burden (although improved), would repeat CT scan in 3 months. If still persistent would consider CT guided Bx.   A: mild Hyperglycemia Plan Ck glucose qid   A: Mild transaminates. C/w ETOH related  improved Plan F/u lft PRN  A: Hypokalemia  Plan: Replace and recheck  A: Macrocytosis w/ mild polycythemia  He admits to drinking at least one large glass of liquor a week >B12 and folate nml Plan -supplemental oxygen    05/20/2013, 9:03 AM Rahaf Carbonell,PETE   05/20/2013, 9:03 AM

## 2013-05-20 NOTE — Progress Notes (Signed)
Patients oxygen saturations on RA is 93% Patient ambulated in hall with saturations from 83-88% Patients saturations with 2L of oxygen with activity is 97%.

## 2013-05-20 NOTE — Telephone Encounter (Signed)
Order placed. Mizani Dilday, CMA  

## 2013-05-25 ENCOUNTER — Ambulatory Visit (HOSPITAL_COMMUNITY): Payer: Medicare Other

## 2013-06-05 ENCOUNTER — Telehealth: Payer: Self-pay | Admitting: Emergency Medicine

## 2013-06-05 NOTE — Telephone Encounter (Signed)
LMTCB

## 2013-06-05 NOTE — Telephone Encounter (Signed)
Pt wanting to know the importance of the Barium Swallow and if he "needed" to have this done. Necessary? I explained to the patient that the Barium Swallow test will help the Dr see if he is possibly aspirating--which could be the cause of his lung problems.  Pt expressed understanding and states that he will proceed with the test.   Nothing further needed.

## 2013-06-05 NOTE — Telephone Encounter (Signed)
Pt is returning triage's call.  Sean Jackson ° °

## 2013-06-05 NOTE — Telephone Encounter (Signed)
Called, spoke with [facility] with [facility].  Order was placed for Home Health at hospital d/c for pt under BQ's name per Benjamine Mola.  This is RB office pt.  Benjamine Mola states this was supposed to start the middle of Dec, but pt was with family and was unable to start.  He now has the flu. Elizabeth calling as FYI -- Start of [employer] will be delayed.  She is hoping to start early next week.  Will sign off and route to RB as FYI.

## 2013-06-08 ENCOUNTER — Ambulatory Visit (HOSPITAL_COMMUNITY)
Admission: RE | Admit: 2013-06-08 | Discharge: 2013-06-08 | Disposition: A | Discharge status: Home / Self Care | Payer: Medicare Other | Source: Ambulatory Visit | Attending: Emergency Medicine | Admitting: Emergency Medicine

## 2013-06-08 DIAGNOSIS — K222 Esophageal obstruction: Secondary | ICD-10-CM | POA: Insufficient documentation

## 2013-06-08 DIAGNOSIS — R1310 Dysphagia, unspecified: Secondary | ICD-10-CM

## 2013-06-08 DIAGNOSIS — M5380 Other specified dorsopathies, site unspecified: Secondary | ICD-10-CM | POA: Insufficient documentation

## 2013-06-10 ENCOUNTER — Encounter: Payer: Self-pay | Admitting: Emergency Medicine

## 2013-06-10 ENCOUNTER — Telehealth (HOSPITAL_COMMUNITY): Payer: Self-pay | Admitting: *Deleted

## 2013-06-10 ENCOUNTER — Ambulatory Visit (INDEPENDENT_AMBULATORY_CARE_PROVIDER_SITE_OTHER): Payer: Medicare Other | Admitting: Cardiovascular Disease

## 2013-06-10 ENCOUNTER — Ambulatory Visit (INDEPENDENT_AMBULATORY_CARE_PROVIDER_SITE_OTHER): Payer: Medicare Other | Admitting: Emergency Medicine

## 2013-06-10 ENCOUNTER — Encounter: Payer: Self-pay | Admitting: Cardiovascular Disease

## 2013-06-10 VITALS — BP 128/79 | HR 83 | Ht 65.5 in | Wt 150.0 lb

## 2013-06-10 VITALS — BP 126/84 | HR 103 | Ht 66.0 in | Wt 152.6 lb

## 2013-06-10 DIAGNOSIS — I2692 Saddle embolus of pulmonary artery without acute cor pulmonale: Secondary | ICD-10-CM

## 2013-06-10 DIAGNOSIS — R918 Other nonspecific abnormal finding of lung field: Secondary | ICD-10-CM

## 2013-06-10 DIAGNOSIS — I2699 Other pulmonary embolism without acute cor pulmonale: Secondary | ICD-10-CM

## 2013-06-10 MED ORDER — RIVAROXABAN 20 MG PO TABS: 20.0000 mg | ORAL_TABLET | Freq: Every day | ORAL | Status: DC

## 2013-06-10 NOTE — Telephone Encounter (Signed)
Call placed to patient regarding referral for Pulmonary Rehab.  He had requested in December to recall after the holidays.  Phylllis Engineer, manufacturing

## 2013-06-10 NOTE — Assessment & Plan Note (Signed)
LLL nodule, needs repeat Ct scan in march to look for interval change.

## 2013-06-10 NOTE — Progress Notes (Signed)
Subjective:    Patient ID: Sean Jackson, male    DOB: 11/22/1945, 68 y.o.   MRN: [id number]  HPI 68 yo never smoker seen for pulmonary consult 03/10/13 for unprovoked saddle  PE with R. LE DVT  Right heart strain on Echo  Factor V Leiden and factor II mutation neg   [facility] follow up 04/06/13 Pt returns for post hospital follow up  Admitted with leg swelling/pain and dyspnea on 03/10/13 found to have  RLE DVT with a large saddle PE with evidence of R heart strain. The patient was started on therapeutic anticoagulation . Felt unprovoked as could not find possible cause.  2 D echo was done with EF 55 % and RV with moderately dilated cavity size and mildly to moderately reduced systolic function. CT chest did show bilateral ground glass opacities.? Secondary to PE . Plans to repeat CT chest in 6 months.  He has no hemotpysis, wt loss, never smoker.     Patient stated had a left lower extremity DVT approximately 5 years ago was on Coumadin for that. 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 Since discharge doing well on coumadin with no known bleeding. INR checks at PCP.  Was discharged on o2 with walking.  Sats drop to 85% on RA in office with walking.   No chest pain, orthopnea or edema.  Right leg swelling has resolved.   ROV 05/14/13 -- follows up for large PE, associated hypoxemia in 10/'14. Also with hx  He has been treated with coumadin managed by Sean Jackson.  He has been fairly inactive, wants to build up his strength again. His genetic testing was negative.   ROV 06/10/13 - follow up for PE on warfarin. He was admitted 12/15-17 for apparent recurrent PE. He underwent R heart cath that showed normal pulm pressures. He is now on xarelto and is ready to go to 20mg  daily. He has a LLL nodule, planning for repeat CT scan in March 2015.    Review of Systems  Constitutional:   No  weight loss, night sweats,  Fevers, chills, fatigue, or   lassitude.  HEENT:   No headaches,  Difficulty swallowing,  Tooth/dental problems, or  Sore throat,                No sneezing, itching, ear ache, nasal congestion, post nasal drip,   CV:  No chest pain,  Orthopnea, PND, swelling in lower extremities, anasarca, dizziness, palpitations, syncope.   GI  No heartburn, indigestion, abdominal pain, nausea, vomiting, diarrhea, change in bowel habits, loss of appetite, bloody stools.   Resp:  No excess mucus, no productive cough,  No non-productive cough,  No coughing up of blood.  No change in color of mucus.  No wheezing.  No chest wall deformity  Skin: no rash or lesions.  GU: no dysuria, change in color of urine, no urgency or frequency.  No flank pain, no hematuria   MS:  No joint pain or swelling.  No decreased range of motion.  No back pain.  Psych:  No change in mood or affect. No depression or anxiety.  No memory loss.       Objective:   Physical Exam Filed Vitals:   06/10/13 1600  BP: 126/84  Pulse: 103  Height: 5\' 6"  (1.676 m)  Weight: 152 lb 9.6 oz (69.219 kg)  SpO2: 91%    GEN: A/Ox3; pleasant , NAD, well nourished   HEENT:  [place]/AT,  EACs-clear, TMs-wnl, NOSE-clear, THROAT-clear, no lesions, no postnasal drip or exudate noted.   NECK:  Supple w/ fair ROM; no JVD; normal carotid impulses w/o bruits; no thyromegaly or nodules palpated; no lymphadenopathy.  RESP  Clear  P & A; w/o, wheezes/ rales/ or rhonchi.no accessory muscle use, no dullness to percussion  CARD:  RRR, ? Faint S4, no peripheral edema, pulses intact, no cyanosis or clubbing.  Musco: Warm bil, no deformities or joint swelling noted.   Neuro: alert, no focal deficits noted.    Skin: Warm, skin seborrhea on face /legs noted.        Assessment & Plan:  Saddle pulmonary embolus No PAH on R heart cath 05/2013.  - continue xarelto, change to 20mg  daily today - continue walking O2, increase to 2.5L/min - continue pulm rehab - rov March  Lung  nodules LLL nodule, needs repeat Ct scan in march to look for interval change.

## 2013-06-10 NOTE — Patient Instructions (Signed)
Your physician wants you to follow-up in:  6 months.  You will receive a reminder letter in the mail two months in advance. If you don't receive a letter, please call our office to schedule the follow-up appointment.  Your physician has requested that you have an echocardiogram. Echocardiography is a painless test that uses sound waves to create images of your heart. It provides your doctor with information about the size and shape of your heart and how well your heart's chambers and valves are working. This procedure takes approximately one hour. There are no restrictions for this procedure. To be done in late April or early May 2015

## 2013-06-10 NOTE — Assessment & Plan Note (Signed)
No PAH on R heart cath 05/2013.  - continue xarelto, change to 20mg  daily today - continue walking O2, increase to 2.5L/min - continue pulm rehab - rov March

## 2013-06-10 NOTE — Progress Notes (Signed)
History of Present Illness: 68 yo male with history of DVT and PE with recent hospitalization 05/18/13 with dyspnea. He has had prior DVT in 2009 and then readmitted October 2014 with recurrent DVT/PE (large saddle embolus). Hypercoagulable workup negative. He has been followed by Dr. Lamonte Sakai. CT chest December 2014 with reduced clot burden bilateral pulmonary arteries but pt was tachycardic and more dyspneic. Dr. Jeffie Pollock did not provide formal consultation but right heart cath on 05/19/13 with mildly elevated right sided pressures. There was not felt to be a role for selective pulmonary vasodilators. He was discharged on 05/20/13 on Xarelto for anti-coagulation.   He is here today to establish cardiology care. He has been feeling well. Breathing is better. Only dyspneic with walking but that seems to be improving. No chest pain.   Primary Care Physician: Milagros Evener  Past Medical History  Diagnosis Date  . DVT   . Pulmonary embolism   . Closed fracture of left wrist     Past Surgical History  Procedure Laterality Date  . Left hand surgery    . Ankle fracture surgery      Current Outpatient Prescriptions  Medication Sig Dispense Refill  . [START ON 06/11/2013] Rivaroxaban (XARELTO) 20 MG TABS tablet Take 1 tablet (20 mg total) by mouth daily with supper.  30 tablet  6   No current facility-administered medications for this visit.    No Known Allergies  History   Social History  . Marital Status: Single    Spouse Name: N/A    Number of Children: 0  . Years of Education: N/A   Occupational History  . Retired-insurance investigator    Social History Main Topics  . Smoking status: Never Smoker   . Smokeless tobacco: Never Used  . Alcohol Use: 0.5 oz/week    1 drink(s) per week     Comment: social  . Drug Use: No  . Sexual Activity: No   Other Topics Concern  . Not on file   Social History Narrative  . No narrative on file    Family History  Problem  Relation Age of Onset  . Heart attack Father 11    Review of Systems:  As stated in the HPI and otherwise negative.   BP 128/79  Pulse 83  Ht 5' 5.5" (1.664 m)  Wt 150 lb (68.04 kg)  BMI 24.57 kg/m2  Physical Examination: General: Well developed, well nourished, NAD HEENT: OP clear, mucus membranes moist SKIN: warm, dry. No rashes. Neuro: No focal deficits Musculoskeletal: Muscle strength 5/5 all ext Psychiatric: Mood and affect normal Neck: No JVD, no carotid bruits, no thyromegaly, no lymphadenopathy. Lungs:Clear bilaterally, no wheezes, rhonci, crackles Cardiovascular: Regular rate and rhythm. No murmurs, gallops or rubs. Abdomen:Soft. Bowel sounds present. Non-tender.  Extremities: No lower extremity edema. Pulses are 2 + in the bilateral DP/PT.  EKG: NSR, rate 83 bpm.   Echo 05/18/13: Left ventricle: The cavity size was normal. Systolic function was normal. The estimated ejection fraction was in the range of 55% to 60%. Wall motion was normal; there were no regional wall motion abnormalities. Doppler parameters are consistent with abnormal left ventricular relaxation (grade 1 diastolic dysfunction). - Right ventricle: The cavity size was mildly dilated. Wall thickness was normal. Systolic function was mildly reduced. - Pulmonary arteries: Systolic pressure was mildly to moderately increased. PA peak pressure: 57mm Hg (S). Impressions: - Compared to the prior study, there has been no significant interval change.  Assessment and Plan:  1. Pulmonary embolism: Unprovoked DVT and PE, recurrent. He has some right heart strain but pressures only mildly elevated. Hypercoagulable workup negative. Continue Xarelto for anti-coagulation. Have to consider malignancy in the differential diagnosis. He has a pulmonary nodule on CT. Further evaluation per pulmonary. May need CT abdomen and pelvis for further malignancy screening. Will repeat echo in April 2015. I will see him back  this summer. No changes today.

## 2013-06-10 NOTE — Patient Instructions (Signed)
We will change your xarelto to 20mg  daily. You will be on anticoagulation for life We will repeat your CT scan of the chest in March 2015.  Please wear your oxygen at 2.5L/min with exertion.  Follow with Dr Lamonte Sakai in 3 months after your CT scan, or sooner if you have any problems.

## 2013-06-11 ENCOUNTER — Telehealth: Payer: Self-pay | Admitting: Emergency Medicine

## 2013-06-11 ENCOUNTER — Inpatient Hospital Stay: Payer: Medicare Other | Admitting: Emergency Medicine

## 2013-06-11 NOTE — Telephone Encounter (Addendum)
Called [facility] and got PA # [phone], [employer]. I called and initiated PA and requested fax be sent to triage. Will await fax. Pt is aware of process. Winnett Bing, CMA

## 2013-06-12 NOTE — Telephone Encounter (Signed)
Form received and completed and given to Barnum to have RB sign and fax. Evans Bing, CMA

## 2013-06-12 NOTE — Telephone Encounter (Signed)
These forms have been signed by RB and I have faxed them.  Pt aware we are awaiting response.  Pt to be contacted with approval or denial. Nothing further is needed at this time

## 2013-06-15 ENCOUNTER — Telehealth (HOSPITAL_COMMUNITY): Payer: Self-pay | Admitting: *Deleted

## 2013-06-16 NOTE — Telephone Encounter (Signed)
I have received approval for the PA on xarelto 20 mg.  This approval is good through 07/16/13. Pt has been notified and nothing further is needed

## 2013-06-26 ENCOUNTER — Telehealth: Payer: Self-pay | Admitting: Emergency Medicine

## 2013-06-26 NOTE — Telephone Encounter (Signed)
Spoke with pt and notified per RB  He verbalized understanding  Nothing further needed

## 2013-06-26 NOTE — Telephone Encounter (Signed)
No reason from my standpoint that he can't have the flu shot

## 2013-06-26 NOTE — Telephone Encounter (Signed)
I called and spoke with pt. He reports when he saw PCP 1 1/2 month ago and was advised to hold off on the flu shot until some of his problems resolved. He reports he feels like he is fine to get one but wants the ok from [place] as well. Please advise thanks

## 2013-06-29 ENCOUNTER — Telehealth (HOSPITAL_COMMUNITY): Payer: Self-pay | Admitting: *Deleted

## 2013-07-17 ENCOUNTER — Encounter (HOSPITAL_COMMUNITY): Payer: Self-pay

## 2013-07-17 NOTE — Progress Notes (Signed)
I have called and left a message x3 with Zvi to inquire about participation in Pulmonary Rehab. Will send letter in mail and follow up.

## 2013-07-24 ENCOUNTER — Telehealth (HOSPITAL_COMMUNITY): Payer: Self-pay

## 2013-07-24 NOTE — Telephone Encounter (Signed)
Patient is interested in Pulmonary Rehab.  Scheduled for orientation 08/25/13 at 12noon.

## 2013-08-17 ENCOUNTER — Inpatient Hospital Stay: Admission: RE | Admit: 2013-08-17 | Payer: Medicare Other | Source: Ambulatory Visit

## 2013-08-24 ENCOUNTER — Encounter (HOSPITAL_COMMUNITY): Payer: Self-pay

## 2013-08-24 ENCOUNTER — Encounter (HOSPITAL_COMMUNITY)
Admission: RE | Admit: 2013-08-24 | Discharge: 2013-08-24 | Disposition: A | Discharge status: Home / Self Care | Payer: Medicare Other | Source: Ambulatory Visit | Attending: Emergency Medicine | Admitting: Emergency Medicine

## 2013-08-24 VITALS — BP 125/87 | HR 86

## 2013-08-24 DIAGNOSIS — E876 Hypokalemia: Secondary | ICD-10-CM | POA: Insufficient documentation

## 2013-08-24 DIAGNOSIS — Z86718 Personal history of other venous thrombosis and embolism: Secondary | ICD-10-CM | POA: Insufficient documentation

## 2013-08-24 DIAGNOSIS — I2782 Chronic pulmonary embolism: Secondary | ICD-10-CM

## 2013-08-24 DIAGNOSIS — R0902 Hypoxemia: Secondary | ICD-10-CM | POA: Insufficient documentation

## 2013-08-24 DIAGNOSIS — Z5189 Encounter for other specified aftercare: Secondary | ICD-10-CM | POA: Insufficient documentation

## 2013-08-24 DIAGNOSIS — Z86711 Personal history of pulmonary embolism: Secondary | ICD-10-CM | POA: Insufficient documentation

## 2013-08-24 NOTE — Progress Notes (Signed)
Sean Jackson 68 y.o. male Pulmonary Rehab Orientation Note Patient arrived today in Cardiac and Pulmonary Rehab for orientation to Pulmonary Rehab. He was transported from [employer] via wheel chair. He does carry portable oxygen. Per pt, he uses oxygen infrequently. Color good, skin warm and dry. Patient is oriented to time and place. Patient's medical history and medications reviewed. Heart rate is normal, breath sounds with crackles 1/2 up from the bases bilaterally. Grip strength equal, strong. Distal pulses 3+ bilateral posterior tibial present . Patient reports he does take medications as prescribed. Patient states he follows a Regular. The patient reports no specific efforts to gain or lose weight.  Patient's weight will be monitored closely. Demonstration and practice of PLB using pulse oximeter. Patient able to return demonstration satisfactorily. Safety and hand hygiene in the exercise area reviewed with patient. Patient voices understanding of the information reviewed. Department expectations discussed with patient and achievable goals were set. The patient shows enthusiasm about attending the program and we look forward to working with this nice gentleman. The patient is scheduled for a 6 min walk test on Tuesday, August 22, 2013 @ 3:15pm and to begin exercise on Thursday, September 03, 2013 in the 1:30pm class.   [phone] Rosebud Poles RN

## 2013-08-25 ENCOUNTER — Ambulatory Visit (INDEPENDENT_AMBULATORY_CARE_PROVIDER_SITE_OTHER)
Admission: RE | Admit: 2013-08-25 | Discharge: 2013-08-25 | Disposition: A | Discharge status: Home / Self Care | Payer: Medicare Other | Source: Ambulatory Visit | Attending: Emergency Medicine | Admitting: Emergency Medicine

## 2013-08-25 DIAGNOSIS — R918 Other nonspecific abnormal finding of lung field: Secondary | ICD-10-CM

## 2013-09-01 ENCOUNTER — Encounter (HOSPITAL_COMMUNITY)
Admission: RE | Admit: 2013-09-01 | Discharge: 2013-09-01 | Disposition: A | Discharge status: Home / Self Care | Payer: Medicare Other | Source: Ambulatory Visit | Attending: Emergency Medicine | Admitting: Emergency Medicine

## 2013-09-01 ENCOUNTER — Encounter (HOSPITAL_COMMUNITY): Payer: Self-pay

## 2013-09-01 NOTE — Progress Notes (Signed)
Shelby completed a Six-Minute Walk Test on 09/01/13 . Rutledge walked 847 feet with 0 breaks.  The patient's lowest oxygen saturation was 89 , highest heart rate was 135 , and highest blood pressure was 120/64. The patient was on 2 liters. Nyal did not say that anything hindered his walk test.

## 2013-09-03 ENCOUNTER — Encounter (HOSPITAL_COMMUNITY): Payer: Medicare Other

## 2013-09-08 ENCOUNTER — Encounter (HOSPITAL_COMMUNITY)
Admission: RE | Admit: 2013-09-08 | Discharge: 2013-09-08 | Disposition: A | Discharge status: Home / Self Care | Payer: Medicare Other | Source: Ambulatory Visit | Attending: Emergency Medicine | Admitting: Emergency Medicine

## 2013-09-08 ENCOUNTER — Encounter (HOSPITAL_COMMUNITY): Payer: Self-pay

## 2013-09-08 DIAGNOSIS — Z86711 Personal history of pulmonary embolism: Secondary | ICD-10-CM | POA: Insufficient documentation

## 2013-09-08 DIAGNOSIS — E876 Hypokalemia: Secondary | ICD-10-CM | POA: Insufficient documentation

## 2013-09-08 DIAGNOSIS — Z86718 Personal history of other venous thrombosis and embolism: Secondary | ICD-10-CM | POA: Insufficient documentation

## 2013-09-08 DIAGNOSIS — R0902 Hypoxemia: Secondary | ICD-10-CM | POA: Insufficient documentation

## 2013-09-08 DIAGNOSIS — Z5189 Encounter for other specified aftercare: Secondary | ICD-10-CM | POA: Insufficient documentation

## 2013-09-08 NOTE — Progress Notes (Signed)
Today, Sean Jackson exercised at [employer]. [facility]. Service time was from 1:30 to 3:30.  The patient exercised for more than 31 minutes performing aerobic, strengthening, and stretching exercises. Oxygen saturation, heart rate, blood pressure, rate of perceived exertion, and shortness of breath were all monitored before, during, and after exercise. Sean Jackson presented with no problems at today's exercise session.   There was no workload change during today's exercise session.  Pre-exercise vitals:   Weight kg: 66.5   Liters of O2: 0   SpO2: 95   HR: 113   BP: 114/70   CBG: na  Exercise vitals:   Highest heartrate:  139   Lowest oxygen saturation: 94   Highest blood pressure: 118/70   Liters of 02: 2  Post-exercise vitals:   SpO2: 97   HR: 90   BP: 112/76   Liters of O2: 2   CBG: na  Dr. Brand Males, Medical Director Dr. Aileen Fass is immediately available during today's Pulmonary Rehab session for Sean Jackson on 09/08/13 at 1:30 class time.

## 2013-09-10 ENCOUNTER — Telehealth: Payer: Self-pay | Admitting: *Deleted

## 2013-09-10 ENCOUNTER — Encounter (HOSPITAL_COMMUNITY): Payer: Self-pay

## 2013-09-10 ENCOUNTER — Encounter (HOSPITAL_COMMUNITY)
Admission: RE | Admit: 2013-09-10 | Discharge: 2013-09-10 | Disposition: A | Discharge status: Home / Self Care | Payer: Medicare Other | Source: Ambulatory Visit | Attending: Emergency Medicine | Admitting: Emergency Medicine

## 2013-09-10 NOTE — Progress Notes (Signed)
Today, Sriram exercised at [employer]. [facility]. Service time was from 1:30 to 3:30.  The patient exercised for more than 31 minutes performing aerobic, strengthening, and stretching exercises. Oxygen saturation, heart rate, blood pressure, rate of perceived exertion, and shortness of breath were all monitored before, during, and after exercise. Cheveyo presented with no problems at today's exercise session. The patient attended "Advanced Directives" today with Jeanella Craze.  There was no workload change during today's exercise session.  Pre-exercise vitals:   Weight kg: 62.5   Liters of O2: 2   SpO2: 91   HR: 96   BP: 106/60   CBG: na  Exercise vitals:   Highest heartrate:  140   Lowest oxygen saturation: 92   Highest blood pressure: 124/60   Liters of 02: 2  Post-exercise vitals:   SpO2: 96   HR: 98   BP: 100/66   Liters of O2: 2   CBG: na  Dr. Brand Males, Medical Director Dr. Dyann Kief is immediately available during today's Pulmonary Rehab session for [name] on 09/10/13 at 1:30 class time.

## 2013-09-10 NOTE — Telephone Encounter (Signed)
Phone #: [phone] ID#: [id number] Spoke with Thomas-PA rep. Was advised I was transferred to the specialty dept and will transfer me to the correct #. He did start a case.  Case # [id number] I was transferred to [phone] pressed option 1 Spoke with Rosemarie Ax. Was advised a PA was already started and this was approved as of today. That case # for approval is [id number] She ran this through and it did go through successfully.  [facility]ed [facility] made aware. Nothing further needed

## 2013-09-14 ENCOUNTER — Telehealth: Payer: Self-pay | Admitting: Emergency Medicine

## 2013-09-14 NOTE — Telephone Encounter (Signed)
Left detailed message that xarelto has been approved through 09/11/14. Pt to call back if he has any further questions

## 2013-09-15 ENCOUNTER — Telehealth: Payer: Self-pay | Admitting: Emergency Medicine

## 2013-09-15 ENCOUNTER — Encounter (HOSPITAL_COMMUNITY): Payer: Self-pay

## 2013-09-15 ENCOUNTER — Encounter (HOSPITAL_COMMUNITY)
Admission: RE | Admit: 2013-09-15 | Discharge: 2013-09-15 | Disposition: A | Discharge status: Home / Self Care | Payer: Medicare Other | Source: Ambulatory Visit | Attending: Emergency Medicine | Admitting: Emergency Medicine

## 2013-09-15 NOTE — Progress Notes (Signed)
Today, [name] exercised at [facility]. Service time was from 1:30 to 3:30.  The patient exercised for more than 31 minutes performing aerobic, strengthening, and stretching exercises. Oxygen saturation, heart rate, blood pressure, rate of perceived exertion, and shortness of breath were all monitored before, during, and after exercise. [name] presented with no problems at today's exercise session.   There was no workload change during today's exercise session.  Pre-exercise vitals:   Weight kg: 68.2   Liters of O2: 2   SpO2: 98   HR: 88   BP: 108/50   CBG: na  Exercise vitals:   Highest heartrate:  121   Lowest oxygen saturation: 92   Highest blood pressure: 124/70   Liters of 02: 2  Post-exercise vitals:   SpO2: 97   HR: 82   BP: 100/60   Liters of O2: 2   CBG: na  Dr. Brand Males, Medical Director Dr. Dyann Kief is immediately available during today's Pulmonary Rehab session for Sean Jackson on 09/15/13 at 10:30 class time.

## 2013-09-15 NOTE — Telephone Encounter (Signed)
Spoke with pt and advised him RB signed placard and he could pick up tomorrow at the front desk

## 2013-09-15 NOTE — Telephone Encounter (Signed)
Please advise RB if willing to give another handicap placard? thanks

## 2013-09-15 NOTE — Telephone Encounter (Signed)
Yes please get the paperwork and we can renew.

## 2013-09-17 ENCOUNTER — Encounter (HOSPITAL_COMMUNITY): Payer: Medicare Other

## 2013-09-22 ENCOUNTER — Encounter (HOSPITAL_COMMUNITY): Payer: Medicare Other

## 2013-09-24 ENCOUNTER — Encounter (HOSPITAL_COMMUNITY)
Admission: RE | Admit: 2013-09-24 | Discharge: 2013-09-24 | Disposition: A | Discharge status: Home / Self Care | Payer: Medicare Other | Source: Ambulatory Visit | Attending: Emergency Medicine | Admitting: Emergency Medicine

## 2013-09-24 NOTE — Progress Notes (Signed)
Today, Toure exercised at [facility]. Service time was from 1400 to 1530.  The patient exercised for more than 31 minutes performing aerobic, strengthening, and stretching exercises. Oxygen saturation, heart rate, blood pressure, rate of perceived exertion, and shortness of breath were all monitored before, during, and after exercise. Ehab presented with no problems at today's exercise session. Sigifredo attended the medication education lecture.  There was no workload change during today's exercise session.  Pre-exercise vitals:   Weight kg: 67.1   Liters of O2: 2 liters   SpO2: 98   HR: 89   BP: 100/50   CBG: na  Exercise vitals:   Highest heartrate:  107   Lowest oxygen saturation: 90   Highest blood pressure: 102/68   Liters of 02: 2 liters  Post-exercise vitals:   SpO2: 98   HR: 80   BP: 100/60   Liters of O2: 2 liters   CBG: na  Dr. Brand Males, Medical Director Dr. Sheran Fava is immediately available during today's Pulmonary Rehab session for [name] on 09/24/13 at 1330 class time.

## 2013-09-29 ENCOUNTER — Encounter (HOSPITAL_COMMUNITY): Payer: Medicare Other

## 2013-10-01 ENCOUNTER — Encounter (HOSPITAL_COMMUNITY)
Admission: RE | Admit: 2013-10-01 | Discharge: 2013-10-01 | Disposition: A | Discharge status: Home / Self Care | Payer: Medicare Other | Source: Ambulatory Visit | Attending: Emergency Medicine | Admitting: Emergency Medicine

## 2013-10-01 NOTE — Progress Notes (Signed)
Today, Sean Jackson exercised at [facility]. Service time was from 1330 to 1530.  The patient exercised for more than 31 minutes performing aerobic, strengthening, and stretching exercises. Oxygen saturation, heart rate, blood pressure, rate of perceived exertion, and shortness of breath were all monitored before, during, and after exercise. Sean Jackson presented with a higher heart rate than normal for him  at today's exercise session. He states he has had a stressful week and has not gotten much sleep.  His heart rate was 114 regular at rest on check in and 115 at check out.  Patient attended the exercise lecture today.   There was a workload change during today's exercise session.  Pre-exercise vitals:   Weight kg: 64.9   Liters of O2: 2   SpO2: 97   HR: 114   BP: 108/76   CBG: NA  Exercise vitals:   Highest heartrate:  132   Lowest oxygen saturation: 94   Highest blood pressure: 96/60   Liters of 02: 2  Post-exercise vitals:   SpO2: 99   HR: 115   BP: 100/60   Liters of O2: 2   CBG: NA Dr. Brand Males, Medical Director Dr. Aileen Fass is immediately available during today's Pulmonary Rehab session for Sean Jackson on 10/01/2013 at 1330 class time.

## 2013-10-06 ENCOUNTER — Encounter (HOSPITAL_COMMUNITY): Payer: Medicare Other

## 2013-10-08 ENCOUNTER — Encounter (HOSPITAL_COMMUNITY): Payer: Medicare Other

## 2013-10-12 ENCOUNTER — Ambulatory Visit (HOSPITAL_COMMUNITY): Payer: Medicare Other | Attending: Internal Medicine | Admitting: Radiology

## 2013-10-12 DIAGNOSIS — I2699 Other pulmonary embolism without acute cor pulmonale: Secondary | ICD-10-CM | POA: Insufficient documentation

## 2013-10-12 NOTE — Progress Notes (Signed)
Echocardiogram performed.  

## 2013-10-13 ENCOUNTER — Encounter (HOSPITAL_COMMUNITY): Payer: Medicare Other

## 2013-10-15 ENCOUNTER — Encounter (HOSPITAL_COMMUNITY)
Admission: RE | Admit: 2013-10-15 | Discharge: 2013-10-15 | Disposition: A | Discharge status: Home / Self Care | Payer: Medicare Other | Source: Ambulatory Visit | Attending: Emergency Medicine | Admitting: Emergency Medicine

## 2013-10-15 DIAGNOSIS — Z86711 Personal history of pulmonary embolism: Secondary | ICD-10-CM | POA: Insufficient documentation

## 2013-10-15 DIAGNOSIS — E876 Hypokalemia: Secondary | ICD-10-CM | POA: Insufficient documentation

## 2013-10-15 DIAGNOSIS — R0902 Hypoxemia: Secondary | ICD-10-CM | POA: Insufficient documentation

## 2013-10-15 DIAGNOSIS — Z5189 Encounter for other specified aftercare: Secondary | ICD-10-CM | POA: Insufficient documentation

## 2013-10-15 DIAGNOSIS — Z86718 Personal history of other venous thrombosis and embolism: Secondary | ICD-10-CM | POA: Insufficient documentation

## 2013-10-15 NOTE — Progress Notes (Signed)
Today, Sean Jackson exercised at [facility]. Service time was from 1:30pm to 3:45pm.  The patient exercised for more than 31 minutes performing aerobic, strengthening, and stretching exercises. Oxygen saturation, heart rate, blood pressure, rate of perceived exertion, and shortness of breath were all monitored before, during, and after exercise. Sean Jackson presented with no problems at today's exercise session. The patient attended "Nutrition for the Pulmonary Patient" education class with Derek Mound.  There was no workload change during today's exercise session.  Pre-exercise vitals:   Weight kg: 67.3   Liters of O2: 2   SpO2: 98   HR: 80   BP: 126/66   CBG: na  Exercise vitals:   Highest heartrate:  117   Lowest oxygen saturation: 92   Highest blood pressure: 126/66   Liters of 02: 2  Post-exercise vitals:   SpO2: 99   HR: 90   BP: 102/68   Liters of O2: 2   CBG: na  Dr. Brand Males, Medical Director Dr. Aileen Fass is immediately available during today's Pulmonary Rehab session for Sean Jackson on 10/15/13 at 1:30pm class time.

## 2013-10-20 ENCOUNTER — Encounter (HOSPITAL_COMMUNITY): Payer: Medicare Other

## 2013-10-22 ENCOUNTER — Encounter (HOSPITAL_COMMUNITY): Payer: Medicare Other

## 2013-10-27 ENCOUNTER — Encounter (HOSPITAL_COMMUNITY)
Admission: RE | Admit: 2013-10-27 | Discharge: 2013-10-27 | Disposition: A | Discharge status: Home / Self Care | Payer: Medicare Other | Source: Ambulatory Visit | Attending: Emergency Medicine | Admitting: Emergency Medicine

## 2013-10-27 NOTE — Progress Notes (Signed)
Today, [name] exercised at [facility]. Service time was from 1330 to 1500.  The patient exercised for more than 31 minutes performing aerobic, strengthening, and stretching exercises. Oxygen saturation, heart rate, blood pressure, rate of perceived exertion, and shortness of breath were all monitored before, during, and after exercise. Sean Jackson presented with no problems at today's exercise session.   There was no workload change during today's exercise session.  Pre-exercise vitals:   Weight kg: 66.7   Liters of O2: 2L   SpO2: 97   HR: 80   BP: 110/70   CBG: na  Exercise vitals:   Highest heartrate:  117   Lowest oxygen saturation: 96   Highest blood pressure: 104/60   Liters of 02: 2L  Post-exercise vitals:   SpO2: 93   HR: 81   BP: 106/60   Liters of O2: ra   CBG: na  Dr. Brand Males, Medical Director Dr. Dyann Kief is immediately available during today's Pulmonary Rehab session for Sean Jackson on 10/27/2013 at 1330 class time.

## 2013-10-29 ENCOUNTER — Encounter (HOSPITAL_COMMUNITY)
Admission: RE | Admit: 2013-10-29 | Discharge: 2013-10-29 | Disposition: A | Discharge status: Home / Self Care | Payer: Medicare Other | Source: Ambulatory Visit | Attending: Emergency Medicine | Admitting: Emergency Medicine

## 2013-10-29 NOTE — Progress Notes (Signed)
Sean Jackson 68 y.o. male Nutrition Note Spoke with pt. Pt is at a normal weight. Per discussion, pt would like to maintain his wt around 148-150 lb.  Pt eats 2 meals and a late night snack daily. Pt prepares most of his meals at home. There are some ways the pt can make his eating habits healthier.  Pt's Rate Your Plate results reviewed with pt. According to pt's Rate Your Plate, pt is consuming very little fruits and vegetables. Pt states he likes and eats most vegetables and has more difficulty consuming fruit. Pt agreed to increase fruit intake to at least 1 serving per day. Pt does not avoid salty food; uses canned/ convenience food. Pt states he uses [employer] meals frequently because "they taste good and they are easy." Pt denies difficulty preparing meals. Pt reports he has not watched his sodium intake "because nobody ever told me I needed to watch it." Pt adds salt to food.  The role of sodium in lung disease reviewed with pt. Pt is pre-diabetic according to his most recent A1c. Pre-diabetes discussed. Pt is familiar with DM "because I've lived with my brother for the past 8 years and he's diabetic." Pt is aware of the need to follow-up with his PCP to discuss pre-diabetes further. Pt expressed understanding of the information reviewed. Nutrition Diagnosis   Food-and nutrition-related knowledge deficit related to lack of exposure to information as related to diagnosis of pulmonary disease Nutrition Rx/Est. Daily Nutrition Needs for: ? wt maintenance 1900-2200 Kcal  80-100 gm protein   2000 mg or less sodium      Nutrition Intervention   Pt's individual nutrition plan and goals reviewed with pt.   Benefits of adopting healthy eating habits discussed when pt's Rate Your Plate reviewed.   Pt to increase fruit intake to at least one serving daily.   Pt to attend the Nutrition and Lung Disease class   Continual client-centered nutrition education by RD, as part of interdisciplinary  care. Goal(s) 1. Pt to identify and limit food sources of sodium. 2. Describe the benefit of including fruits, vegetables, whole grains, and low-fat dairy products in a healthy meal plan. Monitor and Evaluate progress toward nutrition goal with team.   Derek Mound, M.Ed, RD, LDN, CDE 10/29/2013 2:54 PM

## 2013-10-29 NOTE — Progress Notes (Signed)
Today, Sean Jackson exercised at [facility]. Service time was from 1330 to 1530.  The patient exercised for 17 minutes performing aerobic, strengthening, and stretching exercises. Oxygen saturation, heart rate, blood pressure, rate of perceived exertion, and shortness of breath were all monitored before, during, and after exercise. Sean Jackson presented with no problems at today's exercise session. Sean Jackson also attended an education session on the s/s of infection and had 1 on 1 education with the nutritionist.  There was no workload change during today's exercise session.  Pre-exercise vitals:   Weight kg: 65.9   Liters of O2: 2L   SpO2: 94   HR: 99   BP: 132/78   CBG: na  Exercise vitals:   Highest heartrate:  115   Lowest oxygen saturation: 92   Highest blood pressure: 128/80   Liters of 02: 2L  Post-exercise vitals:   SpO2: 97   HR: 94   BP: 124/68   Liters of O2: 2L   CBG: na  Dr. Brand Males, Medical Director Dr. Aileen Fass is immediately available during today's Pulmonary Rehab session for [name] on 10/29/2013 at 1330 class time.

## 2013-11-03 ENCOUNTER — Encounter (HOSPITAL_COMMUNITY): Payer: Medicare Other

## 2013-11-05 ENCOUNTER — Encounter (HOSPITAL_COMMUNITY)
Admission: RE | Admit: 2013-11-05 | Discharge: 2013-11-05 | Disposition: A | Discharge status: Home / Self Care | Payer: Medicare Other | Source: Ambulatory Visit | Attending: Emergency Medicine | Admitting: Emergency Medicine

## 2013-11-05 DIAGNOSIS — E876 Hypokalemia: Secondary | ICD-10-CM | POA: Insufficient documentation

## 2013-11-05 DIAGNOSIS — Z86711 Personal history of pulmonary embolism: Secondary | ICD-10-CM | POA: Insufficient documentation

## 2013-11-05 DIAGNOSIS — Z86718 Personal history of other venous thrombosis and embolism: Secondary | ICD-10-CM | POA: Insufficient documentation

## 2013-11-05 DIAGNOSIS — Z5189 Encounter for other specified aftercare: Secondary | ICD-10-CM | POA: Insufficient documentation

## 2013-11-05 DIAGNOSIS — R0902 Hypoxemia: Secondary | ICD-10-CM | POA: Insufficient documentation

## 2013-11-05 NOTE — Progress Notes (Signed)
Today, Sean Jackson exercised at [facility]. Service time was from 1330 to 1530.  The patient exercised for more than 31 minutes performing aerobic, strengthening, and stretching exercises. Oxygen saturation, heart rate, blood pressure, rate of perceived exertion, and shortness of breath were all monitored before, during, and after exercise. Sean Jackson presented with no problems at today's exercise session. Sean Jackson also attended an education session on exercise for the pulmonary patient.  There was no workload change during today's exercise session.  Pre-exercise vitals:   Weight kg: 66.4   Liters of O2: 2L   SpO2: 97   HR: 88   BP: 107/68   CBG: na  Exercise vitals:   Highest heartrate:  117   Lowest oxygen saturation: 92   Highest blood pressure: 128/92   Liters of 02: 2L  Post-exercise vitals:   SpO2: 97   HR: 87   BP: 100/70   Liters of O2: 2L   CBG: na  Dr. Brand Males, Medical Director Dr. Dyann Kief is immediately available during today's Pulmonary Rehab session for Sean Jackson on 11/05/2013 at 1330 class time.

## 2013-11-05 NOTE — Progress Notes (Signed)
I have reviewed a Home Exercise Prescription with Sean Jackson is not currently exercising at home.  The patient was advised to walk 2 days a week for 30 minutes.  Sean Jackson and I discussed how to progress their exercise prescription.  The patient stated that their goals were to get back to where he was 6 months ago.  The patient stated that they understand the exercise prescription.  We reviewed exercise guidelines, target heart rate during exercise, oxygen use, weather, home pulse oximeter, endpoints for exercise, and goals.  Patient is encouraged to come to me with any questions. I will continue to follow up with the patient to assist them with progression and safety.

## 2013-11-10 ENCOUNTER — Encounter (HOSPITAL_COMMUNITY): Payer: Medicare Other

## 2013-11-12 ENCOUNTER — Encounter (HOSPITAL_COMMUNITY)
Admission: RE | Admit: 2013-11-12 | Discharge: 2013-11-12 | Disposition: A | Discharge status: Home / Self Care | Payer: Medicare Other | Source: Ambulatory Visit | Attending: Emergency Medicine | Admitting: Emergency Medicine

## 2013-11-12 NOTE — Progress Notes (Signed)
Today, Sean Jackson exercised at [facility]. Service time was from 1330 to 1530.  The patient exercised for more than 31 minutes performing aerobic, strengthening, and stretching exercises. Oxygen saturation, heart rate, blood pressure, rate of perceived exertion, and shortness of breath were all monitored before, during, and after exercise. Sean Jackson presented with no problems at today's exercise session. Sean Jackson also attended the education session on advanced directives.  There was no workload change during today's exercise session.  Pre-exercise vitals:   Weight kg: 66.4   Liters of O2: 2L   SpO2: 95   HR: 101   BP: 110/70   CBG: na  Exercise vitals:   Highest heartrate:  130 down to 107 with rest break   Lowest oxygen saturation: 96   Highest blood pressure: 128/70   Liters of 02: 2L  Post-exercise vitals:   SpO2: 97   HR: 86   BP: 106/64   Liters of O2: 2L   CBG: na  Dr. Brand Males, Medical Director Dr. Aileen Fass is immediately available during today's Pulmonary Rehab session for Sean Jackson on 11/12/2013 at 1330 class time.

## 2013-11-17 ENCOUNTER — Encounter (HOSPITAL_COMMUNITY): Payer: Medicare Other

## 2013-11-19 ENCOUNTER — Encounter (HOSPITAL_COMMUNITY)
Admission: RE | Admit: 2013-11-19 | Discharge: 2013-11-19 | Disposition: A | Discharge status: Home / Self Care | Payer: Medicare Other | Source: Ambulatory Visit | Attending: Emergency Medicine | Admitting: Emergency Medicine

## 2013-11-19 NOTE — Progress Notes (Signed)
Today, Sean Jackson exercised at [facility]. Service time was from 1330 to 1515.  The patient exercised for more than 31 minutes performing aerobic, strengthening, and stretching exercises. Oxygen saturation, heart rate, blood pressure, rate of perceived exertion, and shortness of breath were all monitored before, during, and after exercise. Sean Jackson presented with no problems at today's exercise session. Sean Jackson also attended an education session on pulmonary medications.  There was a workload change during today's exercise session.  Pre-exercise vitals:   Weight kg: 66.2   Liters of O2: 2L   SpO2: 97   HR: 96   BP: 102/58   CBG: na  Exercise vitals:   Highest heartrate:  125   Lowest oxygen saturation: 95   Highest blood pressure: 100/70   Liters of 02: 2L  Post-exercise vitals:   SpO2: 97   HR: 88   BP: 100/70   Liters of O2: 2L   CBG: na  Dr. Brand Males, Medical Director Dr. Dyann Kief is immediately available during today's Pulmonary Rehab session for Sean Jackson on 11/19/2013 at 1330 class time.

## 2013-11-24 ENCOUNTER — Encounter (HOSPITAL_COMMUNITY)
Admission: RE | Admit: 2013-11-24 | Discharge: 2013-11-24 | Disposition: A | Discharge status: Home / Self Care | Payer: Medicare Other | Source: Ambulatory Visit | Attending: Emergency Medicine | Admitting: Emergency Medicine

## 2013-11-24 NOTE — Progress Notes (Signed)
Today, Sheriff exercised at [facility]. Service time was from 1330 to 1500.  The patient exercised for more than 31 minutes performing aerobic, strengthening, and stretching exercises. Oxygen saturation, heart rate, blood pressure, rate of perceived exertion, and shortness of breath were all monitored before, during, and after exercise. Refoel presented with no problems at today's exercise session.   There was no workload change during today's exercise session.  Pre-exercise vitals:   Weight kg: 66.3   Liters of O2: 2L   SpO2: 95   HR: 93   BP: 110/60   CBG: na  Exercise vitals:   Highest heartrate:  143   Lowest oxygen saturation: 92   Highest blood pressure: 108/82   Liters of 02: 2L  Post-exercise vitals:   SpO2: 98   HR: 96   BP: 98/60   Liters of O2: 2L   CBG: na  Dr. Brand Males, Medical Director Dr. Dyann Kief is immediately available during today's Pulmonary Rehab session for Sean Jackson on 11/24/2013 at 1330 class time.

## 2013-11-26 ENCOUNTER — Encounter (HOSPITAL_COMMUNITY)
Admission: RE | Admit: 2013-11-26 | Discharge: 2013-11-26 | Disposition: A | Discharge status: Home / Self Care | Payer: Medicare Other | Source: Ambulatory Visit | Attending: Emergency Medicine | Admitting: Emergency Medicine

## 2013-11-26 NOTE — Progress Notes (Signed)
Today, Sean Jackson exercised at [facility]. Service time was from 1330 to 1515.  The patient exercised for more than 31 minutes performing aerobic, strengthening, and stretching exercises. Oxygen saturation, heart rate, blood pressure, rate of perceived exertion, and shortness of breath were all monitored before, during, and after exercise. Sean Jackson presented with no problems at today's exercise session. Sean Jackson also attended an education session on stress reduction and energy conservation.  There was no workload change during today's exercise session.  Pre-exercise vitals:   Weight kg: 66.1   Liters of O2: 2L   SpO2: 96   HR: 101   BP: 104/64   CBG: na  Exercise vitals:   Highest heartrate:  128   Lowest oxygen saturation: 92   Highest blood pressure: 128/70   Liters of 02: 2L  Post-exercise vitals:   SpO2: 98   HR: 100   BP: 114/62   Liters of O2: 2L   CBG: na  Dr. Brand Males, Medical Director Dr. Aileen Fass is immediately available during today's Pulmonary Rehab session for Sean Jackson on 11/26/2013 at 1330 class time.

## 2013-12-01 ENCOUNTER — Encounter (HOSPITAL_COMMUNITY): Payer: Medicare Other

## 2013-12-03 ENCOUNTER — Encounter (HOSPITAL_COMMUNITY)
Admission: RE | Admit: 2013-12-03 | Discharge: 2013-12-03 | Disposition: A | Discharge status: Home / Self Care | Payer: Medicare Other | Source: Ambulatory Visit | Attending: Emergency Medicine | Admitting: Emergency Medicine

## 2013-12-03 DIAGNOSIS — R0902 Hypoxemia: Secondary | ICD-10-CM | POA: Diagnosis not present

## 2013-12-03 DIAGNOSIS — Z5189 Encounter for other specified aftercare: Secondary | ICD-10-CM | POA: Diagnosis not present

## 2013-12-03 DIAGNOSIS — Z86711 Personal history of pulmonary embolism: Secondary | ICD-10-CM | POA: Insufficient documentation

## 2013-12-03 DIAGNOSIS — E876 Hypokalemia: Secondary | ICD-10-CM | POA: Insufficient documentation

## 2013-12-03 DIAGNOSIS — Z86718 Personal history of other venous thrombosis and embolism: Secondary | ICD-10-CM | POA: Diagnosis not present

## 2013-12-03 NOTE — Progress Notes (Signed)
Today, Sean Jackson exercised at [facility]. Service time was from 1330 to 1530.  The patient exercised for more than 31 minutes performing aerobic, strengthening, and stretching exercises. Oxygen saturation, heart rate, blood pressure, rate of perceived exertion, and shortness of breath were all monitored before, during, and after exercise. Sean Jackson presented with no problems at today's exercise session. Sean Jackson also attended an education on oxygen use and safety.  There was no workload change during today's exercise session.  Pre-exercise vitals:   Weight kg: 65.6   Liters of O2: 2L   SpO2: 98   HR: 87   BP: 100/60   CBG: na  Exercise vitals:   Highest heartrate:  109   Lowest oxygen saturation: 95   Highest blood pressure: 116/66   Liters of 02: 2L  Post-exercise vitals:   SpO2: 99   HR: 80   BP: 98/58   Liters of O2: 2L   CBG: na  Dr. Brand Males, Medical Director Dr. Dyann Kief is immediately available during today's Pulmonary Rehab session for Sean Jackson on 12/03/2013 at 1330 class time.

## 2013-12-08 ENCOUNTER — Encounter (HOSPITAL_COMMUNITY): Payer: Medicare Other

## 2013-12-10 ENCOUNTER — Encounter (HOSPITAL_COMMUNITY): Payer: Medicare Other

## 2013-12-15 ENCOUNTER — Encounter (HOSPITAL_COMMUNITY): Payer: Medicare Other

## 2013-12-24 ENCOUNTER — Telehealth (HOSPITAL_COMMUNITY): Payer: Self-pay

## 2013-12-31 ENCOUNTER — Telehealth (HOSPITAL_COMMUNITY): Payer: Self-pay | Admitting: *Deleted

## 2014-01-19 ENCOUNTER — Telehealth (HOSPITAL_COMMUNITY): Payer: Self-pay | Admitting: *Deleted

## 2014-01-21 NOTE — Progress Notes (Signed)
DISCHARGE NOTE FROM PULMONARY REHAB  Sean Jackson has been discharged from pulmonary rehab due to poor attendance.  Sean Jackson started the program with enthusiasm and regular attendance, but that soon decreased.  His last day Sean Jackson attended the exercise session was December 03, 2013.  Sean Jackson has been called multiple times and Sean Jackson has stated Sean Jackson is coming back, but has not returned.  Sean Jackson did not meet his goals which were to increase his endurance, and to get back to his energy level before his last pulmonary embolus.

## 2014-01-21 NOTE — Addendum Note (Signed)
Encounter addended by: Liliane Channel, RN on: 01/21/2014  9:07 AM<BR>     Documentation filed: Notes Section

## 2014-01-26 ENCOUNTER — Encounter (HOSPITAL_COMMUNITY)
Admission: RE | Admit: 2014-01-26 | Discharge: 2014-01-26 | Disposition: A | Discharge status: Home / Self Care | Payer: Medicare Other | Source: Ambulatory Visit | Attending: Emergency Medicine | Admitting: Emergency Medicine

## 2014-01-26 DIAGNOSIS — E876 Hypokalemia: Secondary | ICD-10-CM | POA: Insufficient documentation

## 2014-01-26 DIAGNOSIS — Z5189 Encounter for other specified aftercare: Secondary | ICD-10-CM | POA: Diagnosis present

## 2014-01-26 DIAGNOSIS — Z86718 Personal history of other venous thrombosis and embolism: Secondary | ICD-10-CM | POA: Insufficient documentation

## 2014-01-26 DIAGNOSIS — R0902 Hypoxemia: Secondary | ICD-10-CM | POA: Diagnosis not present

## 2014-01-26 DIAGNOSIS — Z86711 Personal history of pulmonary embolism: Secondary | ICD-10-CM | POA: Insufficient documentation

## 2014-01-26 NOTE — Progress Notes (Deleted)
Today, [name] exercised at [facility]. Service time was from 1330 to 1515.  The patient exercised for more than 31 minutes performing aerobic, strengthening, and stretching exercises. Oxygen saturation, heart rate, blood pressure, rate of perceived exertion, and shortness of breath were all monitored before, during, and after exercise. Sean Jackson presented with no problems at today's exercise session.   There was no workload change during today's exercise session.  Pre-exercise vitals:   Weight kg: 93.6   Liters of O2: 2   SpO2: 92   HR: 88   BP: 112/74   CBG: NA  Exercise vitals:   Highest heartrate:  97   Lowest oxygen saturation: 88   Highest blood pressure: 110/80   Liters of 02: 2  Post-exercise vitals:   SpO2: 91   HR: 69   BP: 110/76   Liters of O2: 2   CBG: NA Dr. Brand Males, Medical Director Dr. Algis Liming is immediately available during today's Pulmonary Rehab session for [name] on 01/26/2014 at 1330 class time.

## 2014-01-26 NOTE — Progress Notes (Signed)
Today, [name] exercised at [facility]. Service time was from 1330 to 1515.  The patient exercised for more than 31 minutes performing aerobic, strengthening, and stretching exercises. Oxygen saturation, heart rate, blood pressure, rate of perceived exertion, and shortness of breath were all monitored before, during, and after exercise. Sean Jackson presented with no problems at today's exercise session.   There was no workload change during today's exercise session.  Pre-exercise vitals:   Weight kg: 64.1   Liters of O2: 2L   SpO2: 95   HR: 108   BP: 122/86   CBG: na  Exercise vitals:   Highest heartrate:  132 down to 119 with rest break   Lowest oxygen saturation: 84 increased to 90 with rest and PLB   Highest blood pressure: 124/60   Liters of 02: 2L  Post-exercise vitals:   SpO2: 98   HR: 86   BP: 108/60   Liters of O2: 2L   CBG: na  Dr. Brand Males, Medical Director Dr. Algis Liming is immediately available during today's Pulmonary Rehab session for Sean Jackson on 01/26/2014 at 1330 class time.

## 2014-01-26 NOTE — Progress Notes (Signed)
After discussing dropping patient from pulmonary rehab due to poor attendance he wants to continue.  He has been absent the last 3 weeks due to moving his place of residence.  We discussed the importance of regular attendance.  Patient has been allowed to continue in pulmonary rehab.

## 2014-01-28 ENCOUNTER — Telehealth (HOSPITAL_COMMUNITY): Payer: Self-pay | Admitting: *Deleted

## 2014-01-28 ENCOUNTER — Encounter (HOSPITAL_COMMUNITY): Admission: RE | Admit: 2014-01-28 | Payer: Medicare Other | Source: Ambulatory Visit

## 2014-02-02 ENCOUNTER — Encounter (HOSPITAL_COMMUNITY)
Admission: RE | Admit: 2014-02-02 | Discharge: 2014-02-02 | Disposition: A | Discharge status: Home / Self Care | Payer: Medicare Other | Source: Ambulatory Visit | Attending: Emergency Medicine | Admitting: Emergency Medicine

## 2014-02-02 DIAGNOSIS — Z86711 Personal history of pulmonary embolism: Secondary | ICD-10-CM | POA: Insufficient documentation

## 2014-02-02 DIAGNOSIS — R0902 Hypoxemia: Secondary | ICD-10-CM | POA: Diagnosis not present

## 2014-02-02 DIAGNOSIS — Z86718 Personal history of other venous thrombosis and embolism: Secondary | ICD-10-CM | POA: Diagnosis not present

## 2014-02-02 DIAGNOSIS — E876 Hypokalemia: Secondary | ICD-10-CM | POA: Insufficient documentation

## 2014-02-02 DIAGNOSIS — Z5189 Encounter for other specified aftercare: Secondary | ICD-10-CM | POA: Insufficient documentation

## 2014-02-02 NOTE — Progress Notes (Signed)
Today, Arvon exercised at [facility]. Service time was from 1330 to 1515.  The patient exercised for more than 31 minutes performing aerobic, strengthening, and stretching exercises. Oxygen saturation, heart rate, blood pressure, rate of perceived exertion, and shortness of breath were all monitored before, during, and after exercise. Dyllan presented with no problems at today's exercise session.   There was no workload change during today's exercise session.  Pre-exercise vitals:   Weight kg: 63.7   Liters of O2: 2L   SpO2: 98   HR: 88   BP: 100/56   CBG: na  Exercise vitals:   Highest heartrate:  103   Lowest oxygen saturation: 91   Highest blood pressure: 98/62   Liters of 02: 2L  Post-exercise vitals:   SpO2: 98   HR: 75   BP: 89/57 increased to 103/71 with gaterade   Liters of O2: 2L   CBG: na  Dr. Brand Males, Medical Director Dr. Broadus John is immediately available during today's Pulmonary Rehab session for [name] on Testicular self exam taught. at 1330 class time.

## 2014-02-04 ENCOUNTER — Encounter (HOSPITAL_COMMUNITY)
Admission: RE | Admit: 2014-02-04 | Discharge: 2014-02-04 | Disposition: A | Discharge status: Home / Self Care | Payer: Medicare Other | Source: Ambulatory Visit | Attending: Emergency Medicine | Admitting: Emergency Medicine

## 2014-02-04 DIAGNOSIS — Z5189 Encounter for other specified aftercare: Secondary | ICD-10-CM | POA: Diagnosis not present

## 2014-02-04 NOTE — Progress Notes (Signed)
Today, Sean Jackson exercised at [facility]. Service time was from 1330 to 1530.  The patient exercised for more than 31 minutes performing aerobic, strengthening, and stretching exercises. Oxygen saturation, heart rate, blood pressure, rate of perceived exertion, and shortness of breath were all monitored before, during, and after exercise. Sean Jackson presented with no problems at today's exercise session. Sean Jackson also attended an education session on home oxygen use and safety.   There was no workload change during today's exercise session.  Pre-exercise vitals:   Weight kg: 64.1   Liters of O2: 2L   SpO2: 98   HR: 101   BP: 104/60   CBG: na  Exercise vitals:   Highest heartrate:  108   Lowest oxygen saturation: 91   Highest blood pressure: 116/72   Liters of 02: 2L  Post-exercise vitals:   SpO2: 98   HR: 89   BP: 124/72   Liters of O2: 2L   CBG: na  Dr. Brand Males, Medical Director Dr. Wendee Beavers is immediately available during today's Pulmonary Rehab session for Sean Jackson on 02/04/2014 at 1330 class time.

## 2014-02-09 ENCOUNTER — Encounter (HOSPITAL_COMMUNITY): Payer: Medicare Other

## 2014-02-11 ENCOUNTER — Encounter (HOSPITAL_COMMUNITY): Payer: Medicare Other

## 2014-02-16 ENCOUNTER — Encounter (HOSPITAL_COMMUNITY)
Admission: RE | Admit: 2014-02-16 | Discharge: 2014-02-16 | Disposition: A | Discharge status: Home / Self Care | Payer: Medicare Other | Source: Ambulatory Visit | Attending: Emergency Medicine | Admitting: Emergency Medicine

## 2014-02-16 DIAGNOSIS — Z5189 Encounter for other specified aftercare: Secondary | ICD-10-CM | POA: Diagnosis not present

## 2014-02-16 NOTE — Progress Notes (Signed)
Today, Sean Jackson exercised at [facility]. Service time was from 1:30pm to 3:30pm.  The patient exercised for more than 31 minutes performing aerobic, strengthening, and stretching exercises. Oxygen saturation, heart rate, blood pressure, rate of perceived exertion, and shortness of breath were all monitored before, during, and after exercise. Sean Jackson presented with no problems at today's exercise session.   There was no workload change during today's exercise session.  Pre-exercise vitals:   Weight kg: 63.6   Liters of O2: 2   SpO2: 98   HR: 86   BP: 100/68   CBG: na  Exercise vitals:   Highest heartrate:  126   Lowest oxygen saturation: 94   Highest blood pressure: 112/80   Liters of 02: 2  Post-exercise vitals:   SpO2: 97   HR: 87   BP: 102/62   Liters of O2: 2   CBG: na  Dr. Brand Males, Medical Director Dr. Coralyn Pear is immediately available during today's Pulmonary Rehab session for Sean Jackson on 02/16/14 at 1:30pm class time.

## 2014-02-18 ENCOUNTER — Encounter (HOSPITAL_COMMUNITY)
Admission: RE | Admit: 2014-02-18 | Discharge: 2014-02-18 | Disposition: A | Discharge status: Home / Self Care | Payer: Medicare Other | Source: Ambulatory Visit | Attending: Emergency Medicine | Admitting: Emergency Medicine

## 2014-02-18 DIAGNOSIS — Z5189 Encounter for other specified aftercare: Secondary | ICD-10-CM | POA: Diagnosis not present

## 2014-02-18 NOTE — Progress Notes (Signed)
Today, Suhas exercised at [employer] [facility]. Service time was from 1330 to 1530.  The patient exercised for more than 31 minutes performing aerobic, strengthening, and stretching exercises. Oxygen saturation, heart rate, blood pressure, rate of perceived exertion, and shortness of breath were all monitored before, during, and after exercise. Shan presented with no problems at today's exercise session. Patient attended the exercise class for the pulmonary patient.   There was no workload change during today's exercise session.  Pre-exercise vitals:   Weight kg: 63.6   Liters of O2: 2   SpO2: 94   HR: 109   BP: 104/60   CBG: na  Exercise vitals:   Highest heartrate:  107   Lowest oxygen saturation: 93   Highest blood pressure: 106/60   Liters of 02: 2  Post-exercise vitals:   SpO2: 98   HR: 89   BP: 124/70   Liters of O2: 2   CBG: na Dr. Brand Males, Medical Director Dr. Wyline Copas is immediately available during today's Pulmonary Rehab session for [name] on 02/18/2014 at 1330 class time.

## 2014-02-23 ENCOUNTER — Encounter (HOSPITAL_COMMUNITY)
Admission: RE | Admit: 2014-02-23 | Discharge: 2014-02-23 | Disposition: A | Discharge status: Home / Self Care | Payer: Medicare Other | Source: Ambulatory Visit | Attending: Emergency Medicine | Admitting: Emergency Medicine

## 2014-02-23 DIAGNOSIS — Z5189 Encounter for other specified aftercare: Secondary | ICD-10-CM | POA: Diagnosis not present

## 2014-02-23 NOTE — Progress Notes (Signed)
Today, Sean Jackson exercised at [employer] [facility]. Service time was from 1330 to 1500.  The patient exercised for more than 31 minutes performing aerobic, strengthening, and stretching exercises. Oxygen saturation, heart rate, blood pressure, rate of perceived exertion, and shortness of breath were all monitored before, during, and after exercise. Sean Jackson presented with no problems at today's exercise session.   There was no workload change during today's exercise session.  Pre-exercise vitals:   Weight kg: 64.0   Liters of O2: 2L   SpO2: 95   HR: 101   BP: 110/60   CBG: na  Exercise vitals:   Highest heartrate:  120   Lowest oxygen saturation: 90   Highest blood pressure: 104/60   Liters of 02: 2L  Post-exercise vitals:   SpO2: 97   HR: 91   BP: 116/60   Liters of O2: 2L   CBG: na  Dr. Brand Males, Medical Director Dr. Wyline Copas is immediately available during today's Pulmonary Rehab session for Sean Jackson on 02/23/2014 at 1330 class time.

## 2014-02-25 ENCOUNTER — Encounter (HOSPITAL_COMMUNITY)
Admission: RE | Admit: 2014-02-25 | Discharge: 2014-02-25 | Disposition: A | Discharge status: Home / Self Care | Payer: Medicare Other | Source: Ambulatory Visit | Attending: Emergency Medicine | Admitting: Emergency Medicine

## 2014-02-25 DIAGNOSIS — Z5189 Encounter for other specified aftercare: Secondary | ICD-10-CM | POA: Diagnosis not present

## 2014-02-25 NOTE — Progress Notes (Signed)
Today, Benjamim exercised at [employer] [facility]. Service time was from 1330 to 1515.  The patient exercised for more than 31 minutes performing aerobic, strengthening, and stretching exercises. Oxygen saturation, heart rate, blood pressure, rate of perceived exertion, and shortness of breath were all monitored before, during, and after exercise. Quintin presented with no problems at today's exercise session. Garion also attended an education session on warning signs and symptoms.  There was no workload change during today's exercise session.  Pre-exercise vitals:   Weight kg: 64.5   Liters of O2: 2L   SpO2: 97   HR: 81   BP: 113/63   CBG: na  Exercise vitals:   Highest heartrate:  101   Lowest oxygen saturation: 92   Highest blood pressure: 112/66   Liters of 02: 2L  Post-exercise vitals:   SpO2: 99   HR: 77   BP: 106/60   Liters of O2: 2L   CBG: na  Dr. Brand Males, Medical Director Dr. Dyann Kief is immediately available during today's Pulmonary Rehab session for [name] on 02/25/2014 at 1330 class time.

## 2014-02-26 ENCOUNTER — Encounter: Payer: Self-pay | Admitting: Emergency Medicine

## 2014-02-26 ENCOUNTER — Ambulatory Visit (INDEPENDENT_AMBULATORY_CARE_PROVIDER_SITE_OTHER): Payer: Medicare Other | Admitting: Emergency Medicine

## 2014-02-26 VITALS — BP 110/80 | HR 103 | Temp 97.7°F | Ht 65.5 in | Wt 143.0 lb

## 2014-02-26 DIAGNOSIS — I2692 Saddle embolus of pulmonary artery without acute cor pulmonale: Secondary | ICD-10-CM

## 2014-02-26 DIAGNOSIS — R918 Other nonspecific abnormal finding of lung field: Secondary | ICD-10-CM

## 2014-02-26 NOTE — Assessment & Plan Note (Signed)
His CT scan 08/2013 is stable compared with 10/14 and 12/14.  - needs repeat scan in 3/'16

## 2014-02-26 NOTE — Progress Notes (Signed)
Subjective:    Patient ID: Sean Jackson, male    DOB: 05/01/1946, 68 y.o.   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 The patient was started on therapeutic anticoagulation . Felt unprovoked as could not find possible cause.  2 D echo was done with EF 55 % and RV with moderately dilated cavity size and mildly to moderately reduced systolic function. CT chest did show bilateral ground glass opacities.? Secondary to PE . Plans to repeat CT chest in 6 months.  He has no hemotpysis, wt loss, never smoker.     Patient stated had a left lower extremity DVT approximately 5 years ago was on Coumadin for that. 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 Since discharge doing well on coumadin with no known bleeding. INR checks at PCP.  Was discharged on o2 with walking.  Sats drop to 85% on RA in office with walking.   No chest pain, orthopnea or edema.  Right leg swelling has resolved.   ROV 05/14/13 -- follows up for large PE, associated hypoxemia in 10/'14. Also with hx  He has been treated with coumadin managed by Dr Radene Ou.  He has been fairly inactive, wants to build up his strength again. His genetic testing was negative.   ROV 06/10/13 - follow up for PE on warfarin. He was admitted 12/15-17 for apparent recurrent PE. He underwent R heart cath that showed normal pulm pressures. He is now on xarelto and is ready to go to 20mg  daily. He has a LLL nodule, planning for repeat CT scan in March 2015.   ROV 02/26/14 -- follow up visit for PE, anticoagulated w xarelto. Also w a LLL and lingular nodules >  stable by CT scan 08/2013. 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  No trouble with bleeding.   Review of Systems  Constitutional:   No  weight loss, night sweats,  Fevers, chills, fatigue, or  lassitude.  HEENT:   No headaches,  Difficulty swallowing,  Tooth/dental problems, or  Sore throat,                No sneezing, itching, ear ache, nasal congestion, post nasal drip,   CV:  No chest pain,  Orthopnea, PND, swelling in lower extremities, anasarca, dizziness, palpitations, syncope.   GI  No heartburn, indigestion, abdominal pain, nausea, vomiting, diarrhea, change in bowel habits, loss of appetite, bloody stools.   Resp:  No excess mucus, no productive cough,  No non-productive cough,  No coughing up of blood.  No change in color of mucus.  No wheezing.  No chest wall deformity  Skin: no rash or lesions.  GU: no dysuria, change in color of urine, no urgency or frequency.  No flank pain, no hematuria   MS:  No joint pain or swelling.  No decreased range of motion.  No back pain.  Psych:  No change in mood or affect. No depression or anxiety.  No memory loss.       Objective:   Physical Exam Filed Vitals:  02/26/14 1458  BP: 110/80  Pulse: 103  Temp: 97.7 F (36.5 C)  TempSrc: Oral  Height: 5' 5.5" (1.664 m)  Weight: 143 lb (64.864 kg)  SpO2: 94%    GEN: A/Ox3; pleasant , NAD, well nourished   HEENT:  [place]/AT,  EACs-clear, TMs-wnl, NOSE-clear, THROAT-clear, no lesions, no postnasal drip or exudate noted.   NECK:  Supple w/ fair ROM; no JVD; normal carotid impulses w/o bruits; no thyromegaly or nodules palpated; no lymphadenopathy.  RESP  Clear  P & A; w/o, wheezes/ rales/ or rhonchi.no accessory muscle use, no dullness to percussion  CARD:  RRR, ? Faint S4, no peripheral edema, pulses intact, no cyanosis or clubbing.  Musco: Warm bil, no deformities or joint swelling noted.   Neuro: alert, no focal deficits noted.    Skin: Warm,  skin seborrhea on face /legs noted.    08/25/13 --  COMPARISON: CT ANGIO CHEST W/CM &/OR WO/CM dated 05/18/2013; CT  ANGIO CHEST W/CM &/OR WO/CM dated 03/10/2013  FINDINGS:  There is no axillary adenopathy. Bilateral gynecomastia is  incidentally noted. Coronary artery atherosclerosis is present. If  office based assessment of coronary risk factors has not been  performed, it is now recommended. Incidental imaging of the upper  abdomen is within normal limits.  Bilateral lower lobe posterior traction bronchiectasis associated  with pulmonary parenchymal fibrosis at the periphery. The central  airways are patent. There is no mediastinal adenopathy. Probable  pulmonary arterial hypertension. Calcification of the  tracheobronchial tree.  The ground-glass attenuation pulmonary nodule in the superior  segment of the left lower lobe has improved, some of which may be  due to slice selection. This measures 8 mm x 12 mm (image 29 series  4).  There is a lingular nodule measuring 5 mm which is stable compared  to prior studies (image 40 series 4).  No aggressive osseous lesions. 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  IMPRESSION:  1. Stable superior segment left lower lobe ground-glass attenuation  pulmonary nodule measuring 8 mm x 12 mm. Stable 5 mm lingular  nodule. Two More annual follow-up examinations are recommended given  the stability since the prior examinations.  2. 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 Posterior basilar predominant scarring may be due to  chronic aspiration, IPF/UIP or thromboembolic disease. Notably, no  subpleural honeycombing.  3. 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  - needs repeat scan in 3/'16  Saddle pulmonary embolus - ok to stop xarelto in 10/'15 - we will then check hypercoag panel in mid Nov   - if he has hypercoag state then will strongly consider lifelong anticoagulation.

## 2014-02-26 NOTE — Assessment & Plan Note (Signed)
-   ok to stop xarelto in 10/'15 - we will then check hypercoag panel in mid Nov  - if he has hypercoag state then will strongly consider lifelong anticoagulation.

## 2014-02-26 NOTE — Patient Instructions (Signed)
Stop your xarelto in October  Follow up in mid November. We will order labwork at that visit We will plan to repeat your CT scan in March 2016.  We will work on sorting out your insurance coverage for your oxygen Follow with Dr Lamonte Sakai in mid November

## 2014-02-26 NOTE — Addendum Note (Signed)
Addended by: Lilli Few on: 02/26/2014 03:53 PM   Modules accepted: Orders

## 2014-02-26 NOTE — Addendum Note (Signed)
Addended by: Mathis Dad on: 02/26/2014 03:32 PM   Modules accepted: Orders

## 2014-03-02 ENCOUNTER — Encounter (HOSPITAL_COMMUNITY): Payer: Medicare Other

## 2014-03-04 ENCOUNTER — Encounter (HOSPITAL_COMMUNITY): Payer: Medicare Other

## 2014-03-04 ENCOUNTER — Telehealth (HOSPITAL_COMMUNITY): Payer: Self-pay | Admitting: Family Medicine

## 2014-03-09 ENCOUNTER — Encounter (HOSPITAL_COMMUNITY)
Admission: RE | Admit: 2014-03-09 | Discharge: 2014-03-09 | Disposition: A | Discharge status: Home / Self Care | Payer: Medicare Other | Source: Ambulatory Visit | Attending: Emergency Medicine | Admitting: Emergency Medicine

## 2014-03-09 DIAGNOSIS — I82409 Acute embolism and thrombosis of unspecified deep veins of unspecified lower extremity: Secondary | ICD-10-CM | POA: Diagnosis not present

## 2014-03-09 DIAGNOSIS — Z7901 Long term (current) use of anticoagulants: Secondary | ICD-10-CM | POA: Diagnosis not present

## 2014-03-09 DIAGNOSIS — I2692 Saddle embolus of pulmonary artery without acute cor pulmonale: Secondary | ICD-10-CM | POA: Diagnosis not present

## 2014-03-09 DIAGNOSIS — D682 Hereditary deficiency of other clotting factors: Secondary | ICD-10-CM | POA: Diagnosis not present

## 2014-03-09 DIAGNOSIS — Z5189 Encounter for other specified aftercare: Secondary | ICD-10-CM | POA: Insufficient documentation

## 2014-03-09 DIAGNOSIS — R918 Other nonspecific abnormal finding of lung field: Secondary | ICD-10-CM | POA: Insufficient documentation

## 2014-03-09 NOTE — Progress Notes (Signed)
Today, Sean Jackson exercised at [employer]. Service time was from 1330 to 1500.  The patient exercised for more than 31 minutes performing aerobic, strengthening, and stretching exercises. Oxygen saturation, heart rate, blood pressure, rate of perceived exertion, and shortness of breath were all monitored before, during, and after exercise. Aengus presented with no problems at today's exercise session.   There was no workload change during today's exercise session.  Pre-exercise vitals:   Weight kg: 63.1   Liters of O2: 2L   SpO2: 97   HR: 106   BP: 108/84   CBG: na  Exercise vitals:   Highest heartrate:  119   Lowest oxygen saturation: 94   Highest blood pressure: 96/68   Liters of 02: 2L  Post-exercise vitals:   SpO2: 100   HR: 56   BP: 108   Liters of O2: 81   CBG: na  Dr. Brand Males, Medical Director Dr. Aileen Fass is immediately available during today's Pulmonary Rehab session for Sean Jackson on 03/09/2014 at 1330 class time.

## 2014-03-11 ENCOUNTER — Encounter (HOSPITAL_COMMUNITY)
Admission: RE | Admit: 2014-03-11 | Discharge: 2014-03-11 | Disposition: A | Discharge status: Home / Self Care | Payer: Medicare Other | Source: Ambulatory Visit | Attending: Emergency Medicine | Admitting: Emergency Medicine

## 2014-03-11 DIAGNOSIS — Z5189 Encounter for other specified aftercare: Secondary | ICD-10-CM | POA: Diagnosis not present

## 2014-03-11 NOTE — Progress Notes (Signed)
Today, Kyrian exercised at [employer]. Service time was from 1330 to 1515.  The patient exercised for more than 31 minutes performing aerobic, strengthening, and stretching exercises. Oxygen saturation, heart rate, blood pressure, rate of perceived exertion, and shortness of breath were all monitored before, during, and after exercise. Chawn presented with no problems at today's exercise session. Ferdinando also attended an education session on pulmonary medications.  There was no workload change during today's exercise session.  Pre-exercise vitals:   Weight kg: 64.6   Liters of O2: 2L   SpO2: 97   HR: 85   BP: 84/50, recheck 92/44   CBG: na  Exercise vitals:   Highest heartrate:  104   Lowest oxygen saturation: 94   Highest blood pressure: 107/62   Liters of 02: 2L  Post-exercise vitals:   SpO2: 100   HR: 92   BP: 114/59   Liters of O2: 2L   CBG: na  Dr. Brand Males, Medical Director Dr. Dyann Kief is immediately available during today's Pulmonary Rehab session for [name] on 03/11/2014 at 1330 class time.

## 2014-03-16 ENCOUNTER — Encounter (HOSPITAL_COMMUNITY)
Admission: RE | Admit: 2014-03-16 | Discharge: 2014-03-16 | Disposition: A | Discharge status: Home / Self Care | Payer: Medicare Other | Source: Ambulatory Visit | Attending: Emergency Medicine | Admitting: Emergency Medicine

## 2014-03-16 DIAGNOSIS — Z5189 Encounter for other specified aftercare: Secondary | ICD-10-CM | POA: Diagnosis not present

## 2014-03-16 NOTE — Progress Notes (Signed)
Today, Sean Jackson exercised at [employer]. Service time was from 1330 to 1500.  The patient exercised for more than 31 minutes performing aerobic, strengthening, and stretching exercises. Oxygen saturation, heart rate, blood pressure, rate of perceived exertion, and shortness of breath were all monitored before, during, and after exercise. Abdulaziz presented with an episode of frequent pvc's while walking on the exercise track today, became pale, shaky, and blood pressure 80/40, heart rate 110 with frequent pvc' s.  He had not eaten since 8 am and the episode occurred at approximately 2:30 pm.  His telemetry resolved to ST and NSR, was given a snack of peanut butter and E[employer]helberger crackers and he felt better.  Telemetry strips faxed to Dr. Camillia Herter office.    There was no workload change during today's exercise session.  Pre-exercise vitals:   Weight kg: 63.6   Liters of O2: 2   SpO2: 97   HR: 103   BP: 122/60   CBG: na  Exercise vitals:   Highest heartrate:  112   Lowest oxygen saturation: 92   Highest blood pressure: 112/60   Liters of 02: 2  Post-exercise vitals:   SpO2: 97   HR: 104   BP: 104/60   Liters of O2: 2   CBG: na Dr. Brand Males, Medical Director Dr. Dyann Kief is immediately available during today's Pulmonary Rehab session for [name] on 03/16/2014 at 1330 class time.

## 2014-03-18 ENCOUNTER — Encounter (HOSPITAL_COMMUNITY)
Admission: RE | Admit: 2014-03-18 | Discharge: 2014-03-18 | Disposition: A | Discharge status: Home / Self Care | Payer: Medicare Other | Source: Ambulatory Visit | Attending: Emergency Medicine | Admitting: Emergency Medicine

## 2014-03-18 DIAGNOSIS — Z5189 Encounter for other specified aftercare: Secondary | ICD-10-CM | POA: Diagnosis not present

## 2014-03-18 NOTE — Progress Notes (Signed)
Bradly completed a Six-Minute Walk Test on 03/18/14 . Jasson walked 887 feet with 0 breaks.  The patient's lowest oxygen saturation was 89% , highest heart rate was 121 , and highest blood pressure was 118/70. The patient was on 2 liters of oxygen with a nasal cannula. Billie stated that nothing hindered their walk test.

## 2014-03-22 NOTE — Progress Notes (Signed)
Discharge Note from Pulmonary Rehab  Patient has been discharged from pulmonary rehab as of 03/18/2014.  He attended 25 exercise sessions.  Sean Jackson's attendance was spuratic due to health issues and moving, but he wanted to finish the program which he did.  Sean Jackson did accomplish his goals which were to increase his endurance and to get back to his exercise level that he had before his last pulmonary embolus which was in December of 2014.  He plans on walking for exercise at home and possibly joining a gym in the near future.  I feel he did make progress in the program albeit small due to his poor attendance.

## 2014-03-23 ENCOUNTER — Encounter (HOSPITAL_COMMUNITY): Payer: Medicare Other

## 2014-03-25 ENCOUNTER — Encounter (HOSPITAL_COMMUNITY): Payer: Medicare Other

## 2014-03-30 ENCOUNTER — Encounter (HOSPITAL_COMMUNITY): Payer: Medicare Other

## 2014-04-01 ENCOUNTER — Encounter (HOSPITAL_COMMUNITY): Payer: Medicare Other

## 2014-04-06 ENCOUNTER — Encounter (HOSPITAL_COMMUNITY): Payer: Medicare Other

## 2014-04-20 ENCOUNTER — Telehealth: Payer: Self-pay | Admitting: Cardiology

## 2014-04-20 NOTE — Telephone Encounter (Signed)
Close encounter 

## 2014-04-26 ENCOUNTER — Ambulatory Visit: Payer: Medicare Other | Admitting: Cardiovascular Disease

## 2014-05-13 ENCOUNTER — Encounter (HOSPITAL_COMMUNITY): Payer: Self-pay | Admitting: Internal Medicine

## 2014-06-29 ENCOUNTER — Ambulatory Visit (INDEPENDENT_AMBULATORY_CARE_PROVIDER_SITE_OTHER): Payer: Medicare Other | Admitting: Cardiovascular Disease

## 2014-06-29 ENCOUNTER — Encounter: Payer: Self-pay | Admitting: Cardiovascular Disease

## 2014-06-29 VITALS — BP 94/60 | HR 105 | Ht 65.0 in | Wt 139.8 lb

## 2014-06-29 DIAGNOSIS — I2699 Other pulmonary embolism without acute cor pulmonale: Secondary | ICD-10-CM

## 2014-06-29 NOTE — Patient Instructions (Signed)
Your physician wants you to follow-up in:  12 months.  You will receive a reminder letter in the mail two months in advance. If you don't receive a letter, please call our office to schedule the follow-up appointment.   

## 2014-06-29 NOTE — Progress Notes (Signed)
History of Present Illness: 69 yo male with history of DVT and PE with hospitalization 05/18/13 with dyspnea. He has had prior DVT in 2009 and then readmitted October 2014 with recurrent DVT/PE (large saddle embolus). Hypercoagulable workup negative. He has been followed by Dr. Lamonte Sakai. CT chest December 2014 with reduced clot burden bilateral pulmonary arteries but pt was tachycardic and more dyspneic. Dr. Jeffie Pollock performed a right heart cath on 05/19/13 which showed mildly elevated right sided pressures. There was not felt to be a role for selective pulmonary vasodilators. He was discharged on 05/20/13 on Xarelto for anti-coagulation. Echo May 2015 with normal LV function, normal RV function, RVSP 41mmHg. CT chest March 2015 with stable pulmonary nodule. Dr. Lamonte Sakai stopped his Xarelto.   He is here today for follow up. No chest pain or SOB.  He is feeling well.   Primary Care Physician: Milagros Evener  Past Medical History  Diagnosis Date  . DVT   . Pulmonary embolism   . Closed fracture of left wrist     Past Surgical History  Procedure Laterality Date  . Left hand surgery    . Ankle fracture surgery    . Right heart catheterization N/A 05/19/2013    Procedure: RIGHT HEART CATH;  Surgeon: Jolaine Artist, MD;  Location: [facility] CATH LAB;  Service: Cardiovascular;  Laterality: N/A;    Current Outpatient Prescriptions  Medication Sig Dispense Refill  . Multiple Vitamins-Minerals (MULTIVITAMIN WITH MINERALS) tablet Take 1 tablet by mouth daily. 1 tablet my mouth daily     No current facility-administered medications for this visit.    No Known Allergies  History   Social History  . Marital Status: Single    Spouse Name: N/A    Number of Children: 0  . Years of Education: N/A   Occupational History  . Retired-insurance investigator    Social History Main Topics  . Smoking status: Never Smoker   . Smokeless tobacco: Never Used  . Alcohol Use: 0.5 oz/week    1 drink(s)  per week     Comment: social  . Drug Use: No  . Sexual Activity: No   Other Topics Concern  . Not on file   Social History Narrative    Family History  Problem Relation Age of Onset  . Heart attack Father 9    Review of Systems:  As stated in the HPI and otherwise negative.   BP 94/60 mmHg  Pulse 105  Ht 5\' 5"  (1.651 m)  Wt 139 lb 12.8 oz (63.413 kg)  BMI 23.26 kg/m2  SpO2 96%  Physical Examination: General: Well developed, well nourished, NAD HEENT: OP clear, mucus membranes moist SKIN: warm, dry. No rashes. Neuro: No focal deficits Musculoskeletal: Muscle strength 5/5 all ext Psychiatric: Mood and affect normal Neck: No JVD, no carotid bruits, no thyromegaly, no lymphadenopathy. Lungs:Clear bilaterally, no wheezes, rhonci, crackles Cardiovascular: Regular rate and rhythm. No murmurs, gallops or rubs. Abdomen:Soft. Bowel sounds present. Non-tender.  Extremities: No lower extremity edema. Pulses are 2 + in the bilateral DP/PT.  EKG: Sinus tach, rate 105 bpm. PVCs.   Echo May 2015: Left ventricle: The cavity size was normal. Wall thickness was normal. Systolic function was normal. The estimated ejection fraction was 50%, in the range of 50% to 55%. Wall motion was normal; there were no regional wall motion abnormalities. Doppler parameters are consistent with abnormal left ventricular relaxation (grade 1 diastolic dysfunction). - Pulmonary arteries: Systolic pressure was mildly to moderately increased. PA  peak pressure: 41mm Hg (S). Impressions: - Compared to 05/18/13, RV not well visualized but now appears normal.  Assessment and Plan:   1. Pulmonary embolism: Unprovoked DVT and PE, recurrent. Hypercoagulable workup negative. No obvious malignancy. Dr. Lamonte Sakai has stopped his Xarelto. He is doing well. Recent echo May 2015 with normal RV and LV function.

## 2014-08-02 ENCOUNTER — Other Ambulatory Visit: Payer: Medicare Other

## 2014-08-02 ENCOUNTER — Ambulatory Visit (INDEPENDENT_AMBULATORY_CARE_PROVIDER_SITE_OTHER): Payer: Medicare Other | Admitting: Emergency Medicine

## 2014-08-02 ENCOUNTER — Encounter: Payer: Self-pay | Admitting: Emergency Medicine

## 2014-08-02 VITALS — BP 120/70 | HR 71 | Ht 65.5 in | Wt 142.0 lb

## 2014-08-02 DIAGNOSIS — I2699 Other pulmonary embolism without acute cor pulmonale: Secondary | ICD-10-CM

## 2014-08-02 DIAGNOSIS — I2692 Saddle embolus of pulmonary artery without acute cor pulmonale: Secondary | ICD-10-CM

## 2014-08-02 DIAGNOSIS — R918 Other nonspecific abnormal finding of lung field: Secondary | ICD-10-CM

## 2014-08-02 NOTE — Assessment & Plan Note (Signed)
History of pulmonary embolism now off anticoagulation. His genetic testing was negative and we will now send the remainder of the hypercoagulable panel. If he has an abnormality then we may need to discuss restarting anticoagulation for lifelong therapy. He did not desaturate on ambulation today and we will discontinue his home oxygen.

## 2014-08-02 NOTE — Patient Instructions (Signed)
Lab work today We will stop your oxygen Future CT scan in March as planned We will call you with the CT and lab results Follow with Dr Lamonte Sakai in 3 months or sooner if you have any problems.

## 2014-08-02 NOTE — Assessment & Plan Note (Signed)
He is due for a repeat CT scan of the chest in March 2016 to follow his pulmonary nodules. We will call him after this test is completed. He is a never smoker and is low risk for malignancy so suspect this will be his last CT if not older stable

## 2014-08-02 NOTE — Progress Notes (Signed)
Subjective:    Patient ID: Sean Jackson, male    DOB: 06/23/1945, 69 y.o.   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 The patient was started on therapeutic anticoagulation . Felt unprovoked as could not find possible cause.  2 D echo was done with EF 55 % and RV with moderately dilated cavity size and mildly to moderately reduced systolic function. CT chest did show bilateral ground glass opacities.? Secondary to PE . Plans to repeat CT chest in 6 months.  He has no hemotpysis, wt loss, never smoker.     Patient stated had a left lower extremity DVT approximately 5 years ago was on Coumadin for that. 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 Since discharge doing well on coumadin with no known bleeding. INR checks at PCP.  Was discharged on o2 with walking.  Sats drop to 85% on RA in office with walking.   No chest pain, orthopnea or edema.  Right leg swelling has resolved.   ROV 05/14/13 -- follows up for large PE, associated hypoxemia in 10/'14. Also with hx  He has been treated with coumadin managed by Dr Radene Ou.  He has been fairly inactive, wants to build up his strength again. His genetic testing was negative.   ROV 06/10/13 - follow up for PE on warfarin. He was admitted 12/15-17 for apparent recurrent PE. He underwent R heart cath that showed normal pulm pressures. He is now on xarelto and is ready to go to 20mg  daily. He has a LLL nodule, planning for repeat CT scan in March 2015.   ROV 08/02/14 -- follow-up visit for pulmonary embolism treated with xarelto. Also follows for a small  LLL nodule on CT scan that has been stable since 12/2012. He is due for a repeat CT scan in 08/2014.  He stopped anticoagulation in October 2015. He is been doing well, breathing well, has good exertional tolerance. He is starting an exercise routine at a local gym                                                                     Review of Systems Per HPI     Objective:   Physical Exam Filed Vitals:   08/02/14 1648  BP: 120/70  Pulse: 71  Height: 5' 5.5" (1.664 m)  Weight: 142 lb (64.411 kg)  SpO2: 94%    GEN: A/Ox3; pleasant , NAD, well nourished   HEENT:  [place]/AT,  EACs-clear, TMs-wnl, NOSE-clear, THROAT-clear, no lesions, no postnasal drip or exudate noted.   NECK:  Supple w/ fair ROM; no JVD; normal carotid impulses w/o bruits; no thyromegaly or nodules palpated; no lymphadenopathy.  RESP  Clear  P & A; w/o, wheezes/ rales/ or rhonchi.no accessory muscle use, no dullness to percussion  CARD:  RRR, ? Faint  S4, no peripheral edema, pulses intact, no cyanosis or clubbing.  Musco: Warm bil, no deformities or joint swelling noted.   Neuro: alert, no focal deficits noted.    Skin: Warm, skin seborrhea on face /legs noted.        Assessment & Plan:  Saddle pulmonary embolus History of pulmonary embolism now off anticoagulation. His genetic testing was negative and we will now send the remainder of the hypercoagulable panel. If he has an abnormality then we may need to discuss restarting anticoagulation for lifelong therapy. He did not desaturate on ambulation today and we will discontinue his home oxygen.    Lung nodules He is due for a repeat CT scan of the chest in March 2016 to follow his pulmonary nodules. We will call him after this test is completed. He is a never smoker and is low risk for malignancy so suspect this will be his last CT if not older stable

## 2014-08-06 ENCOUNTER — Telehealth: Payer: Self-pay | Admitting: Emergency Medicine

## 2014-08-06 DIAGNOSIS — R918 Other nonspecific abnormal finding of lung field: Secondary | ICD-10-CM

## 2014-08-06 LAB — HYPERCOAGULABLE PANEL, COMPREHENSIVE
ANTICARDIOLIPIN IGA: 10 U/mL (ref <22)
ANTICARDIOLIPIN IGG: 7 GPL U/mL (ref <23)
AntiThromb III Func: 111 % (ref 76–126)
Anticardiolipin IgM: 0 MPL U/mL (ref <11)
BETA-2-GLYCOPROTEIN I IGA: 9 A Units (ref <20)
Beta-2 Glyco I IgG: 9 G Units (ref <20)
Beta-2-Glycoprotein I IgM: 5 M Units (ref <20)
DRVVT: 27.7 secs (ref <42.9)
Lupus Anticoagulant: NOT DETECTED
PROTEIN C, TOTAL: 100 % (ref 72–160)
PROTEIN S ACTIVITY: 144 % — AB (ref 69–129)
PTT LA: 32.1 s (ref 28.0–43.0)
Protein C Activity: 152 % — ABNORMAL HIGH (ref 75–133)
Protein S Total: 105 % (ref 60–150)

## 2014-08-06 NOTE — Telephone Encounter (Signed)
Per Dr. Agustina Caroli OV note from 08/02/14, D/C Oxygen.  Called patient, he is doing well off oxygen and would like tanks picked up.  Patient states that his O2 has not dropped below 90% without oxygen.  Order entered for D/C oxygen.  Pt notified. Nothing further needed.

## 2014-08-12 NOTE — Progress Notes (Signed)
Quick Note:  Pt aware of lab results. ______ 

## 2014-08-25 ENCOUNTER — Ambulatory Visit (INDEPENDENT_AMBULATORY_CARE_PROVIDER_SITE_OTHER)
Admission: RE | Admit: 2014-08-25 | Discharge: 2014-08-25 | Disposition: A | Discharge status: Home / Self Care | Payer: Medicare Other | Source: Ambulatory Visit | Attending: Emergency Medicine | Admitting: Emergency Medicine

## 2014-08-25 DIAGNOSIS — I2699 Other pulmonary embolism without acute cor pulmonale: Secondary | ICD-10-CM | POA: Diagnosis not present

## 2014-08-25 DIAGNOSIS — I2692 Saddle embolus of pulmonary artery without acute cor pulmonale: Secondary | ICD-10-CM

## 2014-08-25 DIAGNOSIS — R918 Other nonspecific abnormal finding of lung field: Secondary | ICD-10-CM | POA: Diagnosis not present

## 2015-01-11 ENCOUNTER — Ambulatory Visit: Payer: Medicare Other | Admitting: Emergency Medicine

## 2015-02-01 ENCOUNTER — Telehealth: Payer: Self-pay | Admitting: Emergency Medicine

## 2015-02-01 NOTE — Telephone Encounter (Signed)
Notes Recorded by Collene Gobble, MD on 01/29/2015 at 9:34 PM Pt is overdue for 3 month follow-up appt, please call pt to schedule. Thanks ----------- lmtcb x1 for pt.

## 2015-02-02 NOTE — Telephone Encounter (Signed)
Spoke with pt. He has been scheduled for 02/24/15 at 2:30pm. Nothing further was needed.

## 2015-02-24 ENCOUNTER — Encounter: Payer: Self-pay | Admitting: Emergency Medicine

## 2015-02-24 ENCOUNTER — Ambulatory Visit (INDEPENDENT_AMBULATORY_CARE_PROVIDER_SITE_OTHER): Payer: Medicare Other | Admitting: Emergency Medicine

## 2015-02-24 VITALS — BP 120/80 | HR 67 | Ht 65.5 in | Wt 156.0 lb

## 2015-02-24 DIAGNOSIS — R918 Other nonspecific abnormal finding of lung field: Secondary | ICD-10-CM

## 2015-02-24 DIAGNOSIS — I2692 Saddle embolus of pulmonary artery without acute cor pulmonale: Secondary | ICD-10-CM

## 2015-02-24 DIAGNOSIS — I2699 Other pulmonary embolism without acute cor pulmonale: Secondary | ICD-10-CM | POA: Diagnosis not present

## 2015-02-24 NOTE — Assessment & Plan Note (Signed)
Pulmonary nodule was stable by his most recent CT scan of the chest. He needs a final repeat scan in March 2017

## 2015-02-24 NOTE — Progress Notes (Signed)
Subjective:    Patient ID: Sean Jackson, male    DOB: [birth year], 69 y.o.   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 The patient was started on therapeutic anticoagulation . Felt unprovoked as could not find possible cause.  2 D echo was done with EF 55 % and RV with moderately dilated cavity size and mildly to moderately reduced systolic function. CT chest did show bilateral ground glass opacities.? Secondary to PE . Plans to repeat CT chest in 6 months.  He has no hemotpysis, wt loss, never smoker.     Patient stated had a left lower extremity DVT approximately 5 years ago was on Coumadin for that. 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 Since discharge doing well on coumadin with no known bleeding. INR checks at PCP.  Was discharged on o2 with walking.  Sats drop to 85% on RA in office with walking.   No chest pain, orthopnea or edema.  Right leg swelling has resolved.   ROV 05/14/13 -- follows up for large PE, associated hypoxemia in 10/'14. Also with hx  He has been treated with coumadin managed by Dr Radene Ou.  He has been fairly inactive, wants to build up his strength again. His genetic testing was negative.   ROV 06/10/13 - follow up for PE on warfarin. He was admitted 12/15-17 for apparent recurrent PE. He underwent R heart cath that showed normal pulm pressures. He is now on xarelto and is ready to go to 20mg  daily. He has a LLL nodule, planning for repeat CT scan in March 2015.   ROV 08/02/14 -- follow-up visit for pulmonary embolism treated with xarelto. Also follows for a small  LLL nodule on CT scan that has been stable since 12/2012. He is due for a repeat CT scan in 08/2014.  He stopped anticoagulation in October 2015. He is been doing well, breathing well, has good exertional tolerance. He is starting an exercise routine at a local gym                 ROV 02/24/15 -- follow-up visit for pulmonary embolism treated with xarelto. Also has a small left lower lobe nodule CT scan.   Feels well, no evidence to support recurrent clot. He is exercising and walking. Doing very well. His hypercoag panel was reassuring.     Review of Systems Per HPI     Objective:   Physical Exam Filed Vitals:   02/24/15 1428  BP: 120/80  Pulse: 67  Height: 5' 5.5" (1.664 m)  Weight: 156 lb (70.761 kg)  SpO2: 94%   Gen: Pleasant, well-nourished, in no distress,  normal affect  ENT: No lesions,  mouth clear,  oropharynx clear, no postnasal drip  Neck: No JVD, no TMG, no carotid bruits  Lungs: No use of accessory muscles, clear without rales or rhonchi  Cardiovascular: RRR, heart sounds normal, no murmur or gallops, no peripheral edema  Musculoskeletal: No deformities, no cyanosis or clubbing  Neuro: alert, non focal  Skin: Warm, no lesions or rashes  Assessment & Plan:  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 His hypercoagulability panel was negative. He does not need further anticoagulation unless he has a recurrent event. If that occurs then he would need lifelong anticoagulation  Lung nodules Pulmonary nodule was stable by his most recent CT scan of the chest. He needs a final repeat scan in March 2017

## 2015-02-24 NOTE — Assessment & Plan Note (Signed)
No evidence of recurrence. His hypercoagulability panel was negative. He does not need further anticoagulation unless he has a recurrent event. If that occurs then he would need lifelong anticoagulation

## 2015-02-24 NOTE — Patient Instructions (Signed)
We will plan to repeat your CT scan of the chest without contrast in March 2017 There was no evidence to support a blood clotting disorder that led to your pulmonary embolism Follow with Dr Lamonte Sakai in  March 2017 after your CAT scan Flu shot today

## 2015-05-24 ENCOUNTER — Telehealth: Payer: Self-pay | Admitting: Emergency Medicine

## 2015-05-24 DIAGNOSIS — R918 Other nonspecific abnormal finding of lung field: Secondary | ICD-10-CM

## 2015-05-24 NOTE — Telephone Encounter (Signed)
I called pt to schedule follow up appt in March with RB from recall list.  Pt states he is to have CT prior to OV.  I checked office note & RB does state pt to have CT in March & then see him.  Need an order put in for the CT.  I already scheduled ov.

## 2015-05-24 NOTE — Telephone Encounter (Signed)
Ct ordered.  Thanks Judeen Hammans!

## 2015-08-03 ENCOUNTER — Ambulatory Visit (INDEPENDENT_AMBULATORY_CARE_PROVIDER_SITE_OTHER)
Admission: RE | Admit: 2015-08-03 | Discharge: 2015-08-03 | Disposition: A | Discharge status: Home / Self Care | Payer: Medicare Other | Source: Ambulatory Visit | Attending: Emergency Medicine | Admitting: Emergency Medicine

## 2015-08-03 DIAGNOSIS — R918 Other nonspecific abnormal finding of lung field: Secondary | ICD-10-CM

## 2015-08-30 ENCOUNTER — Ambulatory Visit: Payer: Medicare Other | Admitting: Emergency Medicine

## 2015-10-07 ENCOUNTER — Encounter: Payer: Self-pay | Admitting: Emergency Medicine

## 2015-10-07 ENCOUNTER — Ambulatory Visit (INDEPENDENT_AMBULATORY_CARE_PROVIDER_SITE_OTHER): Payer: Medicare Other | Admitting: Emergency Medicine

## 2015-10-07 VITALS — BP 130/88 | HR 78 | Ht 65.5 in | Wt 153.0 lb

## 2015-10-07 DIAGNOSIS — J849 Interstitial pulmonary disease, unspecified: Secondary | ICD-10-CM | POA: Diagnosis not present

## 2015-10-07 DIAGNOSIS — R918 Other nonspecific abnormal finding of lung field: Secondary | ICD-10-CM | POA: Diagnosis not present

## 2015-10-07 DIAGNOSIS — I2692 Saddle embolus of pulmonary artery without acute cor pulmonale: Secondary | ICD-10-CM

## 2015-10-07 NOTE — Assessment & Plan Note (Signed)
Treatment completed. Hyper coag panel reassuring.

## 2015-10-07 NOTE — Assessment & Plan Note (Signed)
His CT scan from 08/03/15 shows that the left lower lobe pulmonary nodule has largely resolved into an area of apparent scarring. Do not believe he needs serial scans for the lung nodule

## 2015-10-07 NOTE — Assessment & Plan Note (Signed)
Noted on his CT scans dating back to 2014. He denies any autoimmune disease, occupational exposures. There has been some interval increase in the basilar changes. I discussed this with him today.I believe we need to perform pulmonary function testing to assess his current baseline. I also believe we need to follow his chest x-ray and clinical status to ensure that they are not changing. We talked about possibly getting autoimmune serologies but we will hold off on this today. Depending on his imaging, clinical status then we may decide to pursue other testing including possibly even VATS biopsy.

## 2015-10-07 NOTE — Progress Notes (Signed)
Subjective:    Patient ID: Sean Jackson, male    DOB: 1945/08/16, 70 y.o.   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 The patient was started on therapeutic anticoagulation . Felt unprovoked as could not find possible cause.  2 D echo was done with EF 55 % and RV with moderately dilated cavity size and mildly to moderately reduced systolic function. CT chest did show bilateral ground glass opacities.? Secondary to PE . Plans to repeat CT chest in 6 months.  He has no hemotpysis, wt loss, never smoker.     Patient stated had a left lower extremity DVT approximately 5 years ago was on Coumadin for that. 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 Since discharge doing well on coumadin with no known bleeding. INR checks at PCP.  Was discharged on o2 with walking.  Sats drop to 85% on RA in office with walking.   No chest pain, orthopnea or edema.  Right leg swelling has resolved.   ROV 05/14/13 -- follows up for large PE, associated hypoxemia in 10/'14. Also with hx  He has been treated with coumadin managed by Dr Radene Ou.  He has been fairly inactive, wants to build up his strength again. His genetic testing was negative.   ROV 06/10/13 - follow up for PE on warfarin. He was admitted 12/15-17 for apparent recurrent PE. He underwent R heart cath that showed normal pulm pressures. He is now on xarelto and is ready to go to 20mg  daily. He has a LLL nodule, planning for repeat CT scan in March 2015.   ROV 08/02/14 -- follow-up visit for pulmonary embolism treated with xarelto. Also follows for a small  LLL nodule on CT scan that has been stable since 12/2012. He is due for a repeat CT scan in 08/2014.  He stopped anticoagulation in October 2015. He is been doing well, breathing well, has good exertional tolerance. He is starting an exercise routine at a local gym                 ROV 02/24/15 -- follow-up visit for pulmonary embolism treated with xarelto. Also has a small left lower lobe nodule CT scan.   Feels well, no evidence to support recurrent clot. He is exercising and walking. Doing very well. His hypercoag panel was reassuring.   ROV 10/07/15 -- patient with a history of pulmonary embolism that was treated with Xarelto. He has completed his anticoagulation. We have been following a left lower lobe nodule on CT scan of the chest. On 08/03/15 had a repeat scan that showed that the LLL has resolved, but that there is basilar peripheral scar present. ? Etiology. Denies occupational exposures. No inflammatory diseases or family hx ILD.     Review of Systems Per HPI     Objective:   Physical Exam Filed Vitals:   10/07/15 1410  BP: 130/88  Pulse: 78  Height: 5' 5.5" (1.664 m)  Weight: 153 lb (69.4 kg)  SpO2: 89%   Gen: Pleasant, well-nourished, in no distress,  normal  affect  ENT: No lesions,  mouth clear,  oropharynx clear, no postnasal drip  Neck: No JVD, no TMG, no carotid bruits  Lungs: No use of accessory muscles, clear without rales or rhonchi  Cardiovascular: RRR, heart sounds normal, no murmur or gallops, no peripheral edema  Musculoskeletal: No deformities, no cyanosis or clubbing  Neuro: alert, non focal  Skin: Warm, no lesions or rashes  08/03/15 --  COMPARISON: Chest CT August 24, 2014  FINDINGS: Mediastinum/Lymph Nodes: Thyroid appears unremarkable. There is no appreciable thoracic adenopathy. Pericardium is not thickened. There are multiple foci of coronary artery calcification. There is no appreciable thoracic aortic aneurysm. The visualized great  vessels appear unremarkable on this noncontrast enhanced study.  Lungs/Pleura: Mild ground-glass type opacity just superior to the aortic arch on the left is unchanged. There are scattered areas of subpleural reticulation with tiny nodular opacities, stable. The previously noted 11 mm opacity in the superior segment left lower lobe is not appreciated at this time. There is mild scarring in this area currently. The previously noted 5 mm nodular opacity in the inferior lingula is no longer present as a discrete lesion. Currently, it note nodular appearing opacities are apparent beyond tiny nodules associated with reticulonodular interstitial disease in the periphery of the lungs bilaterally, stable. There is bibasilar traction bronchiectatic type change with scattered areas of fibrosis, primarily in the bases, stable. No new opacity is evident.  Upper abdomen: In the visualized upper abdomen, there is hepatic steatosis. Visualized upper abdominal structures otherwise appear unremarkable.  Musculoskeletal: There is degenerative change in the thoracic spine. There are no blastic or lytic bone lesions.  IMPRESSION:pppAreas of bibasilar traction bronchiectasis with interstitial fibrosis. Patchy areas of reticulonodular interstitial disease remains stable. There is no new opacity. No well-defined pulmonary nodular lesions are identified beyond tiny nodular lesions associated with reticulonodular interstitial disease. 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  No frank edema or consolidation.  No adenopathy.  Evidence of hepatic steatosis.  There are multiple foci of coronary artery calcification evident.     Assessment & Plan:  Lung nodules His CT scan from 08/03/15 shows that the left lower lobe pulmonary nodule has largely resolved into an area of apparent scarring. Do not believe he needs serial scans for the lung  nodule  Saddle pulmonary embolus Treatment completed. Hyper coag panel reassuring.  Interstitial lung disease ([place]) Noted on his CT scans dating back to 2014. He denies any autoimmune disease, occupational exposures. There has been some interval increase in the basilar changes. I discussed this with him today.I believe we need to perform pulmonary function testing to assess his current baseline. I also believe we need to follow his chest x-ray and clinical status to ensure that they are not changing. We talked about possibly getting autoimmune serologies but we will hold off on this today. Depending on his imaging, clinical status then we may decide to pursue other testing including possibly even VATS biopsy.

## 2015-10-07 NOTE — Patient Instructions (Signed)
We will perform full pulmonary function testing to establish your baseline.  We will perform an annual CXR to follow for changes in your mild lung scarring.  Follow with Dr Lamonte Sakai in 12 months or sooner if you have any problems

## 2015-11-09 ENCOUNTER — Encounter (HOSPITAL_COMMUNITY): Payer: Medicare Other

## 2015-11-16 ENCOUNTER — Encounter (HOSPITAL_COMMUNITY): Payer: Medicare Other

## 2015-11-25 ENCOUNTER — Encounter (HOSPITAL_COMMUNITY): Payer: Medicare Other

## 2015-12-13 ENCOUNTER — Ambulatory Visit (HOSPITAL_COMMUNITY)
Admission: RE | Admit: 2015-12-13 | Discharge: 2015-12-13 | Disposition: A | Discharge status: Home / Self Care | Payer: Medicare Other | Source: Ambulatory Visit | Attending: Emergency Medicine | Admitting: Emergency Medicine

## 2015-12-13 DIAGNOSIS — J849 Interstitial pulmonary disease, unspecified: Secondary | ICD-10-CM | POA: Insufficient documentation

## 2015-12-13 LAB — PULMONARY FUNCTION TEST
DL/VA % pred: 71 %
DL/VA: 3.02 ml/min/mmHg/L
DLCO unc % pred: 39 %
DLCO unc: 10.04 ml/min/mmHg
FEF 25-75 PRE: 2.09 L/s
FEF 25-75 Post: 2.41 L/sec
FEF2575-%CHANGE-POST: 15 %
FEF2575-%Pred-Post: 124 %
FEF2575-%Pred-Pre: 107 %
FEV1-%Change-Post: -1 %
FEV1-%PRED-POST: 72 %
FEV1-%PRED-PRE: 73 %
FEV1-POST: 1.87 L
FEV1-PRE: 1.9 L
FEV1FVC-%Change-Post: 0 %
FEV1FVC-%Pred-Pre: 116 %
FEV6-%Change-Post: -3 %
FEV6-%PRED-POST: 65 %
FEV6-%Pred-Pre: 67 %
FEV6-PRE: 2.24 L
FEV6-Post: 2.17 L
FEV6FVC-%PRED-PRE: 106 %
FEV6FVC-%Pred-Post: 106 %
FVC-%CHANGE-POST: -1 %
FVC-%PRED-PRE: 63 %
FVC-%Pred-Post: 62 %
FVC-POST: 2.2 L
FVC-Pre: 2.24 L
POST FEV1/FVC RATIO: 85 %
PRE FEV6/FVC RATIO: 100 %
Post FEV6/FVC ratio: 100 %
Pre FEV1/FVC ratio: 85 %
RV % PRED: 87 %
RV: 1.94 L
TLC % pred: 71 %
TLC: 4.28 L

## 2015-12-13 MED ORDER — ALBUTEROL SULFATE (2.5 MG/3ML) 0.083% IN NEBU
2.5000 mg | INHALATION_SOLUTION | Freq: Once | RESPIRATORY_TRACT | Status: AC
  Administered 2015-12-13: 2.5 mg via RESPIRATORY_TRACT

## 2016-01-01 ENCOUNTER — Emergency Department (HOSPITAL_COMMUNITY): Payer: Medicare Other

## 2016-01-01 ENCOUNTER — Encounter (HOSPITAL_COMMUNITY): Payer: Self-pay

## 2016-01-01 ENCOUNTER — Inpatient Hospital Stay (HOSPITAL_COMMUNITY)
Admission: EM | Admit: 2016-01-01 | Discharge: 2016-01-06 | DRG: 682 | Disposition: A | Discharge status: Home Health | Payer: Medicare Other | Attending: Internal Medicine | Admitting: Internal Medicine

## 2016-01-01 DIAGNOSIS — E878 Other disorders of electrolyte and fluid balance, not elsewhere classified: Secondary | ICD-10-CM | POA: Diagnosis present

## 2016-01-01 DIAGNOSIS — K529 Noninfective gastroenteritis and colitis, unspecified: Secondary | ICD-10-CM | POA: Diagnosis present

## 2016-01-01 DIAGNOSIS — K7030 Alcoholic cirrhosis of liver without ascites: Secondary | ICD-10-CM | POA: Diagnosis present

## 2016-01-01 DIAGNOSIS — D696 Thrombocytopenia, unspecified: Secondary | ICD-10-CM | POA: Diagnosis present

## 2016-01-01 DIAGNOSIS — D6851 Activated protein C resistance: Secondary | ICD-10-CM | POA: Diagnosis present

## 2016-01-01 DIAGNOSIS — J849 Interstitial pulmonary disease, unspecified: Secondary | ICD-10-CM | POA: Diagnosis present

## 2016-01-01 DIAGNOSIS — Y900 Blood alcohol level of less than 20 mg/100 ml: Secondary | ICD-10-CM | POA: Diagnosis present

## 2016-01-01 DIAGNOSIS — R6510 Systemic inflammatory response syndrome (SIRS) of non-infectious origin without acute organ dysfunction: Secondary | ICD-10-CM

## 2016-01-01 DIAGNOSIS — K7010 Alcoholic hepatitis without ascites: Secondary | ICD-10-CM | POA: Diagnosis present

## 2016-01-01 DIAGNOSIS — R251 Tremor, unspecified: Secondary | ICD-10-CM | POA: Diagnosis present

## 2016-01-01 DIAGNOSIS — R6511 Systemic inflammatory response syndrome (SIRS) of non-infectious origin with acute organ dysfunction: Secondary | ICD-10-CM | POA: Diagnosis present

## 2016-01-01 DIAGNOSIS — R7401 Elevation of levels of liver transaminase levels: Secondary | ICD-10-CM | POA: Diagnosis present

## 2016-01-01 DIAGNOSIS — R740 Nonspecific elevation of levels of transaminase and lactic acid dehydrogenase [LDH]: Secondary | ICD-10-CM | POA: Diagnosis not present

## 2016-01-01 DIAGNOSIS — E860 Dehydration: Secondary | ICD-10-CM | POA: Diagnosis present

## 2016-01-01 DIAGNOSIS — D539 Nutritional anemia, unspecified: Secondary | ICD-10-CM | POA: Diagnosis present

## 2016-01-01 DIAGNOSIS — Z8249 Family history of ischemic heart disease and other diseases of the circulatory system: Secondary | ICD-10-CM

## 2016-01-01 DIAGNOSIS — D7589 Other specified diseases of blood and blood-forming organs: Secondary | ICD-10-CM | POA: Diagnosis present

## 2016-01-01 DIAGNOSIS — R7989 Other specified abnormal findings of blood chemistry: Secondary | ICD-10-CM | POA: Diagnosis present

## 2016-01-01 DIAGNOSIS — E869 Volume depletion, unspecified: Secondary | ICD-10-CM | POA: Diagnosis present

## 2016-01-01 DIAGNOSIS — E872 Acidosis: Secondary | ICD-10-CM | POA: Diagnosis present

## 2016-01-01 DIAGNOSIS — F1010 Alcohol abuse, uncomplicated: Secondary | ICD-10-CM | POA: Diagnosis present

## 2016-01-01 DIAGNOSIS — N179 Acute kidney failure, unspecified: Principal | ICD-10-CM

## 2016-01-01 DIAGNOSIS — R112 Nausea with vomiting, unspecified: Secondary | ICD-10-CM | POA: Diagnosis not present

## 2016-01-01 DIAGNOSIS — K760 Fatty (change of) liver, not elsewhere classified: Secondary | ICD-10-CM | POA: Diagnosis present

## 2016-01-01 DIAGNOSIS — E876 Hypokalemia: Secondary | ICD-10-CM | POA: Diagnosis present

## 2016-01-01 DIAGNOSIS — R945 Abnormal results of liver function studies: Secondary | ICD-10-CM

## 2016-01-01 DIAGNOSIS — Z86711 Personal history of pulmonary embolism: Secondary | ICD-10-CM

## 2016-01-01 DIAGNOSIS — R52 Pain, unspecified: Secondary | ICD-10-CM | POA: Diagnosis not present

## 2016-01-01 DIAGNOSIS — E8729 Other acidosis: Secondary | ICD-10-CM

## 2016-01-01 DIAGNOSIS — R1110 Vomiting, unspecified: Secondary | ICD-10-CM

## 2016-01-01 DIAGNOSIS — R1115 Cyclical vomiting syndrome unrelated to migraine: Secondary | ICD-10-CM

## 2016-01-01 HISTORY — DX: Interstitial pulmonary disease, unspecified: J84.9

## 2016-01-01 HISTORY — DX: Alcohol abuse, uncomplicated: F10.10

## 2016-01-01 HISTORY — DX: Hypokalemia: E87.6

## 2016-01-01 LAB — CBC
HEMATOCRIT: 46.2 % (ref 39.0–52.0)
Hemoglobin: 16.2 g/dL (ref 13.0–17.0)
MCH: 43.2 pg — ABNORMAL HIGH (ref 26.0–34.0)
MCHC: 35.1 g/dL (ref 30.0–36.0)
MCV: 123.2 fL — AB (ref 78.0–100.0)
Platelets: 116 10*3/uL — ABNORMAL LOW (ref 150–400)
RBC: 3.75 MIL/uL — ABNORMAL LOW (ref 4.22–5.81)
RDW: 16.2 % — AB (ref 11.5–15.5)
WBC: 18.6 10*3/uL — AB (ref 4.0–10.5)

## 2016-01-01 LAB — COMPREHENSIVE METABOLIC PANEL
ALBUMIN: 4.2 g/dL (ref 3.5–5.0)
ALT: 115 U/L — ABNORMAL HIGH (ref 17–63)
AST: 379 U/L — AB (ref 15–41)
Alkaline Phosphatase: 87 U/L (ref 38–126)
Anion gap: 17 — ABNORMAL HIGH (ref 5–15)
BUN: 26 mg/dL — AB (ref 6–20)
CHLORIDE: 92 mmol/L — AB (ref 101–111)
CO2: 27 mmol/L (ref 22–32)
Calcium: 9.7 mg/dL (ref 8.9–10.3)
Creatinine, Ser: 2.24 mg/dL — ABNORMAL HIGH (ref 0.61–1.24)
GFR calc Af Amer: 32 mL/min — ABNORMAL LOW (ref >60)
GFR, EST NON AFRICAN AMERICAN: 28 mL/min — AB (ref >60)
GLUCOSE: 124 mg/dL — AB (ref 65–99)
POTASSIUM: 4.7 mmol/L (ref 3.5–5.1)
Sodium: 136 mmol/L (ref 135–145)
Total Bilirubin: 7.2 mg/dL — ABNORMAL HIGH (ref 0.3–1.2)
Total Protein: 7.1 g/dL (ref 6.5–8.1)

## 2016-01-01 LAB — I-STAT CG4 LACTIC ACID, ED
Lactic Acid, Venous: 4.66 mmol/L (ref 0.5–1.9)
Lactic Acid, Venous: 2.01 mmol/L (ref 0.5–1.9)

## 2016-01-01 LAB — PROCALCITONIN: PROCALCITONIN: 0.38 ng/mL

## 2016-01-01 LAB — PROTIME-INR
INR: 2.03
PROTHROMBIN TIME: 23.3 s — AB (ref 11.4–15.2)

## 2016-01-01 LAB — MAGNESIUM: Magnesium: 1.8 mg/dL (ref 1.7–2.4)

## 2016-01-01 LAB — MRSA PCR SCREENING: MRSA BY PCR: NEGATIVE

## 2016-01-01 LAB — BILIRUBIN, FRACTIONATED(TOT/DIR/INDIR)
BILIRUBIN INDIRECT: 3.1 mg/dL — AB (ref 0.3–0.9)
Bilirubin, Direct: 4.6 mg/dL — ABNORMAL HIGH (ref 0.1–0.5)
Total Bilirubin: 7.7 mg/dL — ABNORMAL HIGH (ref 0.3–1.2)

## 2016-01-01 LAB — SEDIMENTATION RATE: SED RATE: 1 mm/h (ref 0–16)

## 2016-01-01 LAB — ACETAMINOPHEN LEVEL

## 2016-01-01 LAB — ETHANOL

## 2016-01-01 LAB — LIPASE, BLOOD: LIPASE: 68 U/L — AB (ref 11–51)

## 2016-01-01 MED ORDER — METRONIDAZOLE IN NACL 5-0.79 MG/ML-% IV SOLN
500.0000 mg | Freq: Three times a day (TID) | INTRAVENOUS | Status: DC
Start: 2016-01-01 — End: 2016-01-04
  Administered 2016-01-01 – 2016-01-04 (×8): 500 mg via INTRAVENOUS
  Filled 2016-01-01 (×8): qty 100

## 2016-01-01 MED ORDER — PANTOPRAZOLE SODIUM 40 MG PO TBEC
40.0000 mg | DELAYED_RELEASE_TABLET | Freq: Two times a day (BID) | ORAL | Status: DC
  Administered 2016-01-01 – 2016-01-06 (×10): 40 mg via ORAL
  Filled 2016-01-01 (×10): qty 1

## 2016-01-01 MED ORDER — METOCLOPRAMIDE HCL 5 MG/ML IJ SOLN
10.0000 mg | Freq: Once | INTRAMUSCULAR | Status: AC
  Administered 2016-01-01: 10 mg via INTRAVENOUS
  Filled 2016-01-01: qty 2

## 2016-01-01 MED ORDER — ONDANSETRON HCL 4 MG/2ML IJ SOLN
4.0000 mg | Freq: Four times a day (QID) | INTRAMUSCULAR | Status: DC
  Administered 2016-01-01 – 2016-01-03 (×7): 4 mg via INTRAVENOUS
  Filled 2016-01-01 (×9): qty 2

## 2016-01-01 MED ORDER — VANCOMYCIN HCL IN DEXTROSE 1-5 GM/200ML-% IV SOLN
1000.0000 mg | Freq: Once | INTRAVENOUS | Status: AC
  Administered 2016-01-01: 1000 mg via INTRAVENOUS
  Filled 2016-01-01: qty 200

## 2016-01-01 MED ORDER — PIPERACILLIN-TAZOBACTAM 3.375 G IVPB 30 MIN
3.3750 g | Freq: Once | INTRAVENOUS | Status: AC
  Administered 2016-01-01: 3.375 g via INTRAVENOUS
  Filled 2016-01-01: qty 50

## 2016-01-01 MED ORDER — PNEUMOCOCCAL VAC POLYVALENT 25 MCG/0.5ML IJ INJ: 0.5000 mL | INJECTION | INTRAMUSCULAR | Status: DC

## 2016-01-01 MED ORDER — SODIUM CHLORIDE 0.9 % IV SOLN
INTRAVENOUS | Status: DC
  Administered 2016-01-01 – 2016-01-03 (×3): via INTRAVENOUS

## 2016-01-01 MED ORDER — SODIUM CHLORIDE 0.9 % IV BOLUS (SEPSIS)
2000.0000 mL | Freq: Once | INTRAVENOUS | Status: AC
  Administered 2016-01-01: 2000 mL via INTRAVENOUS

## 2016-01-01 MED ORDER — PIPERACILLIN-TAZOBACTAM 3.375 G IVPB: 3.3750 g | Freq: Three times a day (TID) | INTRAVENOUS | Status: DC

## 2016-01-01 MED ORDER — VANCOMYCIN HCL IN DEXTROSE 750-5 MG/150ML-% IV SOLN: 750.0000 mg | INTRAVENOUS | Status: DC

## 2016-01-01 MED ORDER — LEVOFLOXACIN IN D5W 750 MG/150ML IV SOLN
750.0000 mg | INTRAVENOUS | Status: DC
  Administered 2016-01-01: 750 mg via INTRAVENOUS
  Filled 2016-01-01: qty 150

## 2016-01-01 MED ORDER — ONDANSETRON HCL 4 MG/2ML IJ SOLN: 4.0000 mg | Freq: Four times a day (QID) | INTRAMUSCULAR | Status: DC | PRN

## 2016-01-01 MED ORDER — ONDANSETRON HCL 4 MG PO TABS: 4.0000 mg | ORAL_TABLET | Freq: Four times a day (QID) | ORAL | Status: DC | PRN

## 2016-01-01 MED ORDER — SODIUM CHLORIDE 0.9% FLUSH
3.0000 mL | Freq: Two times a day (BID) | INTRAVENOUS | Status: DC
  Administered 2016-01-02 – 2016-01-06 (×7): 3 mL via INTRAVENOUS

## 2016-01-01 MED ORDER — SODIUM CHLORIDE 0.9 % IV BOLUS (SEPSIS)
250.0000 mL | Freq: Once | INTRAVENOUS | Status: AC
  Administered 2016-01-01: 250 mL via INTRAVENOUS

## 2016-01-01 MED ORDER — DIPHENHYDRAMINE HCL 50 MG/ML IJ SOLN
12.5000 mg | Freq: Once | INTRAMUSCULAR | Status: AC
  Administered 2016-01-01: 12.5 mg via INTRAVENOUS
  Filled 2016-01-01: qty 1

## 2016-01-01 MED ORDER — LORAZEPAM 2 MG/ML IJ SOLN
2.0000 mg | INTRAMUSCULAR | Status: DC | PRN
  Administered 2016-01-02: 2 mg via INTRAVENOUS
  Filled 2016-01-01: qty 1

## 2016-01-01 NOTE — Consult Note (Signed)
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    The patient reports he began feeling poorly 2 days ago.  He developed nausea and vomiting - reporting at least 7 episodes of vomiting. He has been unable to eat or drink. Reports loose stools but no diarrhea.  His last drink of alcohol was two weeks prior to admission.  He denied fevers, chills, abdominal pain / distention, prior abdominal surgery.  Reports he had a prior episode.  Initial ER evaluation was worrisome for sepsis.  Vitals 101/57, R 20, HR 114, 91% on RA.  Labs - Na 136, K 4.7, Cl92, BUN 26, Sr Cr 2.24, glucose 124, AG 17, Mg 1.8, AST 379 / ALT 115, lactic acid 4.66, WBC 18.6, Hgb 16.2, and platelets 116.  The patient was treated with 30 ml/kg volume resuscitation with improvement in blood pressure.  Initial CXR showed LLL atelectasis vs infiltrate vs chronic ILD changes.   PCCM called for evaluation of sepsis    PAST MEDICAL HISTORY :  He  has a past medical history of Closed fracture of left wrist; DVT; ETOH abuse; Hypokalemia; Interstitial lung disease ([place]); and Pulmonary embolism.  PAST SURGICAL HISTORY: He  has a past surgical history that includes LEFT HAND SURGERY; Ankle fracture surgery; and right heart catheterization (N/A, 05/19/2013).  No Known Allergies  No current facility-administered medications on file prior to encounter.    Current Outpatient Prescriptions on File Prior to  Encounter  Medication Sig  . Multiple Vitamins-Minerals (MULTIVITAMIN WITH MINERALS) tablet Take 1 tablet by mouth daily. 1 tablet my mouth daily    FAMILY HISTORY:  His indicated that his mother is alive. He indicated that his father is deceased.    SOCIAL HISTORY: He  reports that he has never smoked. He has never used smokeless tobacco. He reports that he drinks about 0.5 oz of alcohol per week . He reports that he does not use drugs.  REVIEW OF SYSTEMS:  POSITIVES IN BOLD Gen: Denies fever, chills, weight change, fatigue, night sweats HEENT: Denies blurred vision, double vision, hearing loss, tinnitus, sinus congestion, rhinorrhea, sore throat, neck stiffness, dysphagia PULM: Denies shortness of breath, cough, sputum production, hemoptysis, wheezing CV: Denies chest pain, edema, orthopnea, paroxysmal nocturnal dyspnea, palpitations GI: Denies abdominal pain, nausea, vomiting, diarrhea, hematochezia, melena, constipation, change in bowel habits GU: Denies dysuria, hematuria, polyuria, oliguria, urethral discharge Endocrine: Denies hot or cold intolerance, polyuria, polyphagia or appetite change Derm: Denies rash, dry skin, scaling or peeling skin change Heme: Denies easy bruising, bleeding, bleeding gums Neuro: Denies headache, numbness, weakness, slurred speech, loss of memory or consciousness   SUBJECTIVE:   VITAL SIGNS: BP 115/66   Pulse 95   Temp 98 F (36.7 C) (Oral)   Resp 17   Ht 5' 5"  (1.651 m)   Wt 150 lb (68 kg)   SpO2 (!) 87%   BMI 24.96 kg/m   HEMODYNAMICS:    VENTILATOR  SETTINGS:    INTAKE / OUTPUT: No intake/output data recorded.  PHYSICAL EXAMINATION: General: no distress Neuro: alert, nonfocal, O x 4 HEENT:  jvd down flat Cardiovascular:  s1 s2 RRR Lungs:  Coarse slight LLL Abdomen:  Soft, BS wnl, no r/g, no fluid wave, no liver tip Musculoskeletal:  No edema Skin: spider angioma face abdo etc  LABS:  BMET  Recent Labs Lab  01/01/16 1128  NA 136  K 4.7  CL 92*  CO2 27  BUN 26*  CREATININE 2.24*  GLUCOSE 124*    Electrolytes  Recent Labs Lab 01/01/16 1128 01/01/16 1130  CALCIUM 9.7  --   MG  --  1.8    CBC  Recent Labs Lab 01/01/16 1128  WBC 18.6*  HGB 16.2  HCT 46.2  PLT 116*    Coag's No results for input(s): APTT, INR in the last 168 hours.  Sepsis Markers  Recent Labs Lab 01/01/16 1137  LATICACIDVEN 4.66*    ABG No results for input(s): PHART, PCO2ART, PO2ART in the last 168 hours.  Liver Enzymes  Recent Labs Lab 01/01/16 1128  AST 379*  ALT 115*  ALKPHOS 87  BILITOT 7.2*  ALBUMIN 4.2    Cardiac Enzymes No results for input(s): TROPONINI, PROBNP in the last 168 hours.  Glucose No results for input(s): GLUCAP in the last 168 hours.  Imaging Ct Abdomen Pelvis Wo Contrast  Result Date: 01/01/2016 CLINICAL DATA:  Abdominal pain and vomiting. EXAM: CT ABDOMEN AND PELVIS WITHOUT CONTRAST TECHNIQUE: Multidetector CT imaging of the abdomen and pelvis was performed following the standard protocol without IV contrast. COMPARISON:  None. FINDINGS: Lower chest: Diffuse peripheral interstitial coarsening bronchiectasis is noted at the lung bases bilaterally. No focal airspace consolidation is present. The heart is mildly enlarged. Coronary artery calcifications are present. Hepatobiliary: Extensive fatty infiltration of the liver is noted. No discrete mass lesion is present. The liver contours are smooth. The common bile duct is within normal limits. Gallbladder is unremarkable. Pancreas: No significant inflammatory changes are present. No focal cystic or solid mass lesion is present. There is no ductal dilation. Spleen: Normal size in appearance Adrenals/Urinary Tract: The adrenal glands are normal bilaterally. The kidneys and ureters are unremarkable. No mass lesion or stone is present. There is mild perinephric stranding bilaterally. The urinary bladder is within normal  limits. Stomach/Bowel: The stomach is mildly dilated with a fluid level. No obstructing lesion is present. The duodenum is within normal limits. The small bowel is unremarkable. Contrast extends to the lower mid abdomen without a discrete transition point. The more distal bowel and terminal ileum are within normal limits. The appendix is visualized and normal. The cecum is within normal limits. Diverticular changes are present within the ascending colon without focal inflammation. Diverticula are also present in the descending and sigmoid colon without focal inflammation. Vascular/Lymphatic: Atherosclerotic calcifications are present within the aorta and branch vessels without aneurysm. No significant adenopathy is present. Reproductive: Unremarkable Other: No free fluid or free air is present. Musculoskeletal: Grade 1 degenerative anterolisthesis is present at L4-5. There slight retrolisthesis at L2-3. No focal lytic or blastic lesions are present. Vertebral body heights are maintained. Advanced facet hypertrophy is present at L4-5 and L5-S1. The bony pelvis is intact. IMPRESSION: 1. Mild dilation of the stomach without an obstructing lesion. This is nonspecific and may be within normal limits. 2. Colonic diverticular changes within the ascending colon, descending colon and proximal sigmoid colon without focal inflammation to suggest diverticulitis. 3. Diffuse  hepatic steatosis. No discrete lesions or evidence for cirrhosis are present. 4. Grade 1 degenerative anterolisthesis at L4-5 secondary to advanced facet hypertrophy. Electronically Signed   By: San Morelle M.D.   On: 01/01/2016 12:50  Dg Chest Port 1 View  Result Date: 01/01/2016 CLINICAL DATA:  Sepsis EXAM: PORTABLE CHEST 1 VIEW COMPARISON:  CT 08/03/2015 FINDINGS: Heart is normal size. Left lower lobe atelectasis or infiltrate. No visible effusion. Right lung is clear. No acute bony abnormality. IMPRESSION: Left lower lobe atelectasis or  infiltrate. Electronically Signed   By: Rolm Baptise M.D.   On: 01/01/2016 12:30  STUDIES:  CT ABD/Pelvis 7/30 >> mild dilation of the stomach without obstruction, colonic diverticular changes without suggestion of diverticulitis, diffuse hepatic steatosis - no discrete lesions or evidence of cirrhosis  CULTURES: BCx2 7/30 >>  UA 7/30 >>  UC 7/30 >>   ANTIBIOTICS: Vanco (empiric sepsis) 7/30 >> 7/30 Zosyn (empiric sepsis) 7/30 >>7/30 levofloxacin 7/30>>>  SIGNIFICANT EVENTS: 7/30  Admit with n/v, concern for sepsis vs volume depletion   LINES/TUBES: difficult PIV   ASSESSMENT / PLAN:  PULMONARY A: ILD Hx Unprovoked PE / DVT  Known LLL Pulmonary Nodule  P:  O2 as needed to support saturations > 92% I have reviewed al past CT and pcxr, prior findings account for slight LLL hazziness  CARDIOVASCULAR A:  Sinus Tachycardia - mild, likely related to volume depletion with n/v NO Sepsis noted Clearing lactic acid P:  No line or cvp needed He is responding to fluid Lactic is cleared regardless of liver dz Would keep pos balance throughout night, re add saline at 75 No further lactic acids needed I performed a repeat sepsis assessment for perfusion If BP drops, would need bolus further and trop and repeat lactic NO a line needed No cortisol needed Ensure 30 cc/kg given  RENAL A:   Acute Kidney Injury - in setting of n/v Anion Gap Acidosis - suspect component of lactic acid P:   Volume resuscitation, NS to 75 Trend BMP / UOP  Replace electrolytes as indicated  Get tox screen and etoh , tyelnal levels  INFECTIOUS A:   Met SIRS, I don't see sepsis source NO nosocomial exposure last 90 days CT neg R/o gastroenteritis (viral) P:   Empiric abx as above Trend PCT to limit abx over 48-72 hours, likley can dc all abx if neg 0.5 pct x 2 and good clinical status Was given Vosyn, I don't see nosocomial exposure, could narrow mononotherpay empiric quinolone If dirarhia  noted add flagyl  GI A: Nausea / Vomiting - r/o gastritis, etoh related etc Elevated LFT's - suspect in setting of ETOH abuse , innate liver dz?  P:  Clears okay or fulls Add aggressive ppi bid PRN zofran  Trend LFT's further, bili, get indirect Get acute hep panel Concern is worsening innate liver fxn unrelated - would have GI consult in am for this bili etc ( he denies etoh active for year, not sure bout that)  NEURO A: ETOH Abuse  P:  Monitor for withdrawal symptoms   FAMILY  - Updates: I updated the pt in room  [employer]. Titus Mould, MD, [place] Pgr: [facility]

## 2016-01-01 NOTE — ED Notes (Signed)
1st blood culture drawn

## 2016-01-01 NOTE — Progress Notes (Signed)
Pharmacy Antibiotic Note  Sean Jackson is a 70 y.o. male admitted on 01/01/2016 with sepsis.  Pharmacy has been consulted for vancomycin/Zosyn dosing.  The pt is a 70 year old male who presents emergency Department with chief complaint of vomiting. He has a past medical history of interstitial lung disease, chronic alcohol abuse, hypokalemia and hypoxia. Patient states he had onset of nausea and vomiting  1.5 days ago.     Plan: Vancomycin 1000 mg IV x1, then vancomycin 750 mg IV q24h Zosyn 3.375 gr IV q8h  Height: 5\' 5"  (165.1 cm) Weight: 150 lb (68 kg) IBW/kg (Calculated) : 61.5  Temp (24hrs), Avg:98 F (36.7 C), Min:98 F (36.7 C), Max:98 F (36.7 C)   Recent Labs Lab 01/01/16 1128 01/01/16 1137  WBC 18.6*  --   CREATININE 2.24*  --   LATICACIDVEN  --  4.66*    Estimated Creatinine Clearance: 26.3 mL/min (by C-G formula based on SCr of 2.24 mg/dL).    No Known Allergies  Antimicrobials this admission: 7/30 Zosyn>>  7/30 vanomycin >>   Dose adjustments this admission: ----  Microbiology results: 7/30 BCx: sent 7/30 UCx: sent    Thank you for allowing pharmacy to be a part of this patient's care.   Royetta Asal, PharmD, BCPS Pager [phone] 01/01/2016 12:54 PM

## 2016-01-01 NOTE — ED Triage Notes (Signed)
He reports nausea and "in the past 48 hours I vomited 7 times".  He denies fever/diarrhea and is in no distress.  He c/o some bilat. Abd. Muscle soreness.

## 2016-01-01 NOTE — ED Notes (Signed)
Bed: [id number] Expected date:  Expected time:  Means of arrival:  Comments: 70 yo N/V

## 2016-01-01 NOTE — ED Provider Notes (Signed)
[facility] DEPT Provider Note   CSN: [id number] Arrival date & time: 01/01/16  1033  First Provider Contact:  First MD Initiated Contact with Patient 01/01/16 1158        History   Chief Complaint Chief Complaint  Patient presents with  . Emesis    HPI Sean Jackson is a 70 y.o. male.  HPI  BAYAN PILAT This 70 year old male who presents emergency Department with chief complaint of vomiting. He has a past medical history of interstitial lung disease, chronic alcohol abuse, hypokalemia and hypoxia. Patient states he had onset of nausea and vomiting  1.5 days ago.  The patient denies abdominal pain. Vomitus has been nonbloody, nonbilious. He has been unable to hold down any foods or fluids. He has had some loose stools but denies diarrhea. He denies chills, fever. He denies abdominal distention. He states that he is drinking very little and his last alcohol intake was 2 weeks ago. He states that he always has a tremor. He denies a history of abdominal surgeries. He had a similar episode 2. His complaint today about 4 years ago, which was determined to be viral. Denies fevers, chills, myalgias, arthralgias. Denies DOE, SOB, chest tightness or pressure, radiation to left arm, jaw or back, or diaphoresis. Denies dysuria, flank pain, suprapubic pain, frequency, urgency, or hematuria. Denies headaches, light headedness, weakness, visual disturbances. Denies constipation.   Past Medical History:  Diagnosis Date  . Closed fracture of left wrist   . DVT   . Pulmonary embolism     Patient Active Problem List   Diagnosis Date Noted  . Interstitial lung disease ([place]) 10/07/2015  . Dyspnea 05/18/2013  . Hypoxemia 05/18/2013  . Alcohol abuse 05/18/2013  . Lung nodules 04/06/2013  . Hypokalemia 03/14/2013  . 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 Right leg DVT ([place]) 03/10/2013    Past Surgical History:  Procedure Laterality Date  . ANKLE FRACTURE SURGERY    . LEFT HAND  SURGERY    . RIGHT HEART CATHETERIZATION N/A 05/19/2013   Procedure: RIGHT HEART CATH;  Surgeon: Jolaine Artist, MD;  Location: [facility] CATH LAB;  Service: Cardiovascular;  Laterality: N/A;       Home Medications    Prior to Admission medications   Medication Sig Start Date End Date Taking? Authorizing Provider  Multiple Vitamins-Minerals (MULTIVITAMIN WITH MINERALS) tablet Take 1 tablet by mouth daily. 1 tablet my mouth daily   Yes Historical Provider, MD    Family History Family History  Problem Relation Age of Onset  . Heart attack Father 74    Social History Social History  Substance Use Topics  . Smoking status: Never Smoker  . Smokeless tobacco: Never Used  . Alcohol use 0.5 oz/week    1 drink(s) per week     Comment: social     Allergies   Review of patient's allergies indicates no known allergies.   Review of Systems Review of Systems Ten systems reviewed and are negative for acute change, except as noted in the HPI.    Physical Exam Updated Vital Signs BP 101/57 (BP Location: Left Arm)   Pulse 114   Temp 98 F (36.7 C) (Oral)   Resp 20   SpO2 91%   Physical Exam  Constitutional: He is oriented to person, place, and time. He appears well-developed and well-nourished. No distress.  HENT:  Head: Normocephalic and atraumatic.  Eyes: Conjunctivae are normal. No scleral icterus.  Neck: Normal range of motion. Neck supple.  Cardiovascular: Normal rate, regular rhythm, normal heart sounds and intact distal pulses.   Pulmonary/Chest: Effort normal and breath sounds normal. No respiratory distress.  Abdominal: Soft. He exhibits distension (normal per patient). He exhibits no mass. There is no tenderness. There is no rebound and no guarding.  Hyperactive bowel sounds  Musculoskeletal: He exhibits no edema.  Neurological: He is alert and oriented to person, place, and time.  Skin: Skin is warm and dry. Capillary refill takes less than 2 seconds. He is not  diaphoretic.  Psychiatric: His behavior is normal.  Nursing note and vitals reviewed.    ED Treatments / Results  Labs (all labs ordered are listed, but only abnormal results are displayed) Labs Reviewed  LIPASE, BLOOD - Abnormal; Notable for the following:       Result Value   Lipase 68 (*)    All other components within normal limits  COMPREHENSIVE METABOLIC PANEL - Abnormal; Notable for the following:    Chloride 92 (*)    Glucose, Bld 124 (*)    BUN 26 (*)    Creatinine, Ser 2.24 (*)    AST 379 (*)    ALT 115 (*)    Total Bilirubin 7.2 (*)    GFR calc non Af Amer 28 (*)    GFR calc Af Amer 32 (*)    Anion gap 17 (*)    All other components within normal limits  CBC - Abnormal; Notable for the following:    WBC 18.6 (*)    RBC 3.75 (*)    MCV 123.2 (*)    MCH 43.2 (*)    RDW 16.2 (*)    Platelets 116 (*)    All other components within normal limits  BILIRUBIN, FRACTIONATED(TOT/DIR/INDIR) - Abnormal; Notable for the following:    Total Bilirubin 7.7 (*)    Bilirubin, Direct 4.6 (*)    Indirect Bilirubin 3.1 (*)    All other components within normal limits  ACETAMINOPHEN LEVEL - Abnormal; Notable for the following:    Acetaminophen (Tylenol), Serum <10 (*)    All other components within normal limits  CBC - Abnormal; Notable for the following:    RBC 2.98 (*)    Hemoglobin 12.9 (*)    HCT 35.9 (*)    MCV 120.5 (*)    MCH 43.3 (*)    RDW 15.7 (*)    Platelets 99 (*)    All other components within normal limits  COMPREHENSIVE METABOLIC PANEL - Abnormal; Notable for the following:    BUN 23 (*)    Calcium 8.1 (*)    Total Protein 5.0 (*)    Albumin 2.8 (*)    AST 575 (*)    ALT 202 (*)    Total Bilirubin 8.5 (*)    All other components within normal limits  PROTIME-INR - Abnormal; Notable for the following:    Prothrombin Time 23.3 (*)    All other components within normal limits  I-STAT CG4 LACTIC ACID, ED - Abnormal; Notable for the following:    Lactic  Acid, Venous 4.66 (*)    All other components within normal limits  I-STAT CG4 LACTIC ACID, ED - Abnormal; Notable for the following:    Lactic Acid, Venous 2.01 (*)    All other components within normal limits  CULTURE, BLOOD (ROUTINE X 2)  CULTURE, BLOOD (ROUTINE X 2)  MRSA PCR SCREENING  MAGNESIUM  PROCALCITONIN  PROCALCITONIN  ETHANOL  SEDIMENTATION RATE  HEPATITIS PANEL, ACUTE  ANTINUCLEAR  ANTIBODIES, IFA  MITOCHONDRIAL ANTIBODIES  ANTI-SMOOTH MUSCLE ANTIBODY, IGG  ALPHA-1-ANTITRYPSIN  PROTEIN ELECTROPHORESIS, SERUM  PROCALCITONIN  COMPREHENSIVE METABOLIC PANEL  CBC  MAGNESIUM  PHOSPHORUS  I-STAT CG4 LACTIC ACID, ED  I-STAT CG4 LACTIC ACID, ED    EKG  EKG Interpretation None       Radiology No results found.  Procedures Procedures (including critical care time)  Medications Ordered in ED Medications  sodium chloride 0.9 % bolus 250 mL (not administered)  piperacillin-tazobactam (ZOSYN) IVPB 3.375 g (not administered)  vancomycin (VANCOCIN) IVPB 1000 mg/200 mL premix (not administered)  metoCLOPramide (REGLAN) injection 10 mg (not administered)  diphenhydrAMINE (BENADRYL) injection 12.5 mg (not administered)  sodium chloride 0.9 % bolus 2,000 mL (2,000 mLs Intravenous New Bag/Given 01/01/16 1149)     Initial Impression / Assessment and Plan / ED Course  I have reviewed the triage vital signs and the nursing notes.  Pertinent labs & imaging results that were available during my care of the patient were reviewed by me and considered in my medical decision making (see chart for details).  Clinical Course  Value Comment By Time   Patient here with vomiting. His lactic acid is elevated at 4.66 with a white count, soft, blood pressure, tachycardia. He is afebrile, but fits sepsis criteria. Code sepsis called . I ordered weight-based fluid dosing, vancomycin and Zosyn per sepsis of unknown source. He has a benign abdominal exam. I do not suspect acute mesenteric  ischemia. I have high suspicion for SIRS with dehydration. The patient appears well and is mentating well. Margarita Mail, PA-C 07/30 1206   Patient CMP shows aki, hypochloremia, anion gap acidosis ( likely from lactate) and worsening liver function with highly elevated bilirubin. Margarita Mail, PA-C 07/30 1256  MCV: (!) 123.2 CBC shows a macrocytosis without anemia. Margarita Mail, PA-C 07/30 1258  Platelets: (!) 116 Thrmobocytopenia noted Margarita Mail, PA-C 07/30 1259   Patient appears to be improving with a bp of [id number] systolic at this time. Margarita Mail, PA-C 07/30 1306    Patient will be admitted to stepdown unit. I feel that his symptoms are likely secondary to massive dehydration from vomiting. He will be treated as a sepsis case as he fits criteria. Appears safe for admission at this time.  Final Clinical Impressions(s) / ED Diagnoses   Final diagnoses:  SIRS (systemic inflammatory response syndrome) (HCC)  Emesis, persistent  AKI (acute kidney injury) ([place])  Increased anion gap metabolic acidosis  Hyperbilirubinemia    New Prescriptions Current Discharge Medication List       Margarita Mail, PA-C 01/02/16 1721    Duffy Bruce, MD 01/03/16 [phone]

## 2016-01-01 NOTE — H&P (Signed)
History and Physical    Sean Jackson [id number] DOB: 12-18-1945 DOA: 01/01/2016  PCP: Aretta Nip, MD Patient coming from: Home  Chief Complaint: Nausea and vomiting  HPI: Sean Jackson is a 70 y.o. male with medical history significant of alcohol abuse, . Patient presents to the [facility] Emergency Department for evaluation of persistent nausea and vomiting. Patient states that symptoms started two days ago and have persisted. He reports about 6-7 episodes of non-bilious, non-bloody emesis. He has not been able to keep food or fluids down secondary to emesis. He has no associated diarrhea and reports about one normal, nonbloody or black stool per day. No fevers at home. He has no sister chest pain, shortness of breath, abdominal pain, palpitations. He states he does not drink alcohol, however, family reports that he has a history of alcohol abuse. Patient states that his last alcoholic drink was about 6 months ago. He reports no tobacco or illicit drug use.  ED Course: Patient was initially found to be tachycardic with mild tachypnea and afebrile. CBC was significant for leukocytosis to 18.6 thousand. Code sepsis was called, initial lactic acid was 4.66, and patient received a 2250 mL (91ml/kg) normal saline IV bolus. Vancomycin and Zosyn were started. Patient given Reglan which helped his nausea. CCM was called for evaluation and will see patient when he reaches the floor. Repeat lactic acid came down to 2.01  Review of Systems: As per HPI otherwise 10 point review of systems negative.   Past Medical History:  Diagnosis Date  . Closed fracture of left wrist   . DVT   . ETOH abuse   . Hypokalemia   . Interstitial lung disease ([place])   . Pulmonary embolism     Past Surgical History:  Procedure Laterality Date  . ANKLE FRACTURE SURGERY    . LEFT HAND SURGERY    . RIGHT HEART CATHETERIZATION N/A 05/19/2013   Procedure: RIGHT HEART CATH;  Surgeon: Jolaine Artist, MD;   Location: [facility] CATH LAB;  Service: Cardiovascular;  Laterality: N/A;     reports that he has never smoked. He has never used smokeless tobacco. He reports that he drinks about 0.5 oz of alcohol per week . He reports that he does not use drugs.  No Known Allergies  Family History  Problem Relation Age of Onset  . Heart attack Father 20    Prior to Admission medications   Medication Sig Start Date End Date Taking? Authorizing Provider  Multiple Vitamins-Minerals (MULTIVITAMIN WITH MINERALS) tablet Take 1 tablet by mouth daily. 1 tablet my mouth daily   Yes Historical Provider, MD    Physical Exam: Vitals:   01/01/16 1315 01/01/16 1330 01/01/16 1345 01/01/16 1400  BP:  111/66  115/66  Pulse: 97 98 95 95  Resp: 23 23 19 17   Temp:      TempSrc:      SpO2: 93% 92% 92% (!) 87%  Weight:      Height:          Constitutional: NAD, calm, comfortable Vitals:   01/01/16 1315 01/01/16 1330 01/01/16 1345 01/01/16 1400  BP:  111/66  115/66  Pulse: 97 98 95 95  Resp: 23 23 19 17   Temp:      TempSrc:      SpO2: 93% 92% 92% (!) 87%  Weight:      Height:       Eyes: PERRL, lids normal, scleral icterus ENMT: Mucous membranes are moist. Posterior pharynx  clear of any exudate or lesions.Normal dentition.  Neck: normal, supple, no masses, no thyromegaly Respiratory: clear to auscultation bilaterally, no wheezing, no crackles. Normal respiratory effort. No accessory muscle use.  Cardiovascular: Regular rate and rhythm, no murmurs / rubs / gallops. No extremity edema. 2+ pedal pulses. No carotid bruits.  Abdomen: no tenderness, no masses palpated. No hepatosplenomegaly. Bowel sounds positive.  Musculoskeletal: no clubbing / cyanosis. No joint deformity upper and lower extremities. Good ROM, no contractures. Normal muscle tone.  Skin: no rashes, lesions, ulcers. No induration Neurologic: CN 2-12 grossly intact. Sensation intact, DTR normal. Strength 5/5 in all 4. No asterixis Psychiatric:  Normal judgment and insight. Alert and oriented x 3. Normal mood.   Labs on Admission: I have personally reviewed following labs and imaging studies  CBC:  Recent Labs Lab 01/01/16 1128  WBC 18.6*  HGB 16.2  HCT 46.2  MCV 123.2*  PLT [id number]*   Basic Metabolic Panel:  Recent Labs Lab 01/01/16 1128 01/01/16 1130  NA 136  --   K 4.7  --   CL 92*  --   CO2 27  --   GLUCOSE 124*  --   BUN 26*  --   CREATININE 2.24*  --   CALCIUM 9.7  --   MG  --  1.8   GFR: Estimated Creatinine Clearance: 26.3 mL/min (by C-G formula based on SCr of 2.24 mg/dL). Liver Function Tests:  Recent Labs Lab 01/01/16 1128  AST 379*  ALT 115*  ALKPHOS 87  BILITOT 7.2*  PROT 7.1  ALBUMIN 4.2    Recent Labs Lab 01/01/16 1128  LIPASE 68*   No results for input(s): AMMONIA in the last 168 hours. Coagulation Profile: No results for input(s): INR, PROTIME in the last 168 hours. Cardiac Enzymes: No results for input(s): CKTOTAL, CKMB, CKMBINDEX, TROPONINI in the last 168 hours. BNP (last 3 results) No results for input(s): PROBNP in the last 8760 hours. HbA1C: No results for input(s): HGBA1C in the last 72 hours. CBG: No results for input(s): GLUCAP in the last 168 hours. Lipid Profile: No results for input(s): CHOL, HDL, LDLCALC, TRIG, CHOLHDL, LDLDIRECT in the last 72 hours. Thyroid Function Tests: No results for input(s): TSH, T4TOTAL, FREET4, T3FREE, THYROIDAB in the last 72 hours. Anemia Panel: No results for input(s): VITAMINB12, FOLATE, FERRITIN, TIBC, IRON, RETICCTPCT in the last 72 hours. Urine analysis:    Component Value Date/Time   COLORURINE AMBER (A) 05/18/2013 0830   APPEARANCEUR CLEAR 05/18/2013 0830   LABSPEC >1.046 (H) 05/18/2013 0830   PHURINE 5.5 05/18/2013 0830   GLUCOSEU NEGATIVE 05/18/2013 0830   HGBUR NEGATIVE 05/18/2013 0830   BILIRUBINUR NEGATIVE 05/18/2013 0830   KETONESUR NEGATIVE 05/18/2013 0830   PROTEINUR NEGATIVE 05/18/2013 0830   UROBILINOGEN 1.0  05/18/2013 0830   NITRITE NEGATIVE 05/18/2013 0830   LEUKOCYTESUR NEGATIVE 05/18/2013 0830   Sepsis Labs: !!!!!!!!!!!!!!!!!!!!!!!!!!!!!!!!!!!!!!!!!!!! @LABRCNTIP (procalcitonin:4,lacticidven:4) )No results found for this or any previous visit (from the past 240 hour(s)).   Radiological Exams on Admission: Ct Abdomen Pelvis Wo Contrast  Result Date: 01/01/2016 CLINICAL DATA:  Abdominal pain and vomiting. EXAM: CT ABDOMEN AND PELVIS WITHOUT CONTRAST TECHNIQUE: Multidetector CT imaging of the abdomen and pelvis was performed following the standard protocol without IV contrast. COMPARISON:  None. FINDINGS: Lower chest: Diffuse peripheral interstitial coarsening bronchiectasis is noted at the lung bases bilaterally. No focal airspace consolidation is present. The heart is mildly enlarged. Coronary artery calcifications are present. Hepatobiliary: Extensive fatty infiltration of the liver is noted.  No discrete mass lesion is present. The liver contours are smooth. The common bile duct is within normal limits. Gallbladder is unremarkable. Pancreas: No significant inflammatory changes are present. No focal cystic or solid mass lesion is present. There is no ductal dilation. Spleen: Normal size in appearance Adrenals/Urinary Tract: The adrenal glands are normal bilaterally. The kidneys and ureters are unremarkable. No mass lesion or stone is present. There is mild perinephric stranding bilaterally. The urinary bladder is within normal limits. Stomach/Bowel: The stomach is mildly dilated with a fluid level. No obstructing lesion is present. The duodenum is within normal limits. The small bowel is unremarkable. Contrast extends to the lower mid abdomen without a discrete transition point. The more distal bowel and terminal ileum are within normal limits. The appendix is visualized and normal. The cecum is within normal limits. Diverticular changes are present within the ascending colon without focal inflammation.  Diverticula are also present in the descending and sigmoid colon without focal inflammation. Vascular/Lymphatic: Atherosclerotic calcifications are present within the aorta and branch vessels without aneurysm. No significant adenopathy is present. Reproductive: Unremarkable Other: No free fluid or free air is present. Musculoskeletal: Grade 1 degenerative anterolisthesis is present at L4-5. There slight retrolisthesis at L2-3. No focal lytic or blastic lesions are present. Vertebral body heights are maintained. Advanced facet hypertrophy is present at L4-5 and L5-S1. The bony pelvis is intact. IMPRESSION: 1. Mild dilation of the stomach without an obstructing lesion. This is nonspecific and may be within normal limits. 2. Colonic diverticular changes within the ascending colon, descending colon and proximal sigmoid colon without focal inflammation to suggest diverticulitis. 3. Diffuse hepatic steatosis. No discrete lesions or evidence for cirrhosis are present. 4. Grade 1 degenerative anterolisthesis at L4-5 secondary to advanced facet hypertrophy. Electronically Signed   By: San Morelle M.D.   On: 01/01/2016 12:50  Dg Chest Port 1 View  Result Date: 01/01/2016 CLINICAL DATA:  Sepsis EXAM: PORTABLE CHEST 1 VIEW COMPARISON:  CT 08/03/2015 FINDINGS: Heart is normal size. Left lower lobe atelectasis or infiltrate. No visible effusion. Right lung is clear. No acute bony abnormality. IMPRESSION: Left lower lobe atelectasis or infiltrate. Electronically Signed   By: Rolm Baptise M.D.   On: 01/01/2016 12:30  Assessment/Plan Principal Problem:   SIRS (systemic inflammatory response syndrome) (HCC) Active Problems:   Alcohol abuse   Hyperbilirubinemia   Transaminitis  SIRS Will treat as secondary to intraabdominal infection. Possible cholangitis although no current fevers. Response improved with fluid boluses. Zosyn started. -continue Levaquin per CCM -add  metronidazole -zofran  Hyperbilirubinemia Transaminitis CT abdomen unremarkable for cause -RUQ ultrasound -Consult GI in AM -repeat CMP in AM -addon fractionated bilirubin  Alcohol abuse -CIWA -ethanol level -tylenol level  DVT prophylaxis: SCDs Code Status: Full Code Disposition Plan: Home pending improvement of symptoms Consults called: CCM by ED Admission status: Inpatient, SDU   Cordelia Poche MD [facility]  If 7PM-7AM, please contact night-coverage [email or web address] Password [email or web address]  01/01/2016, 3:52 PM

## 2016-01-02 ENCOUNTER — Encounter (HOSPITAL_COMMUNITY): Payer: Self-pay | Admitting: *Deleted

## 2016-01-02 ENCOUNTER — Inpatient Hospital Stay (HOSPITAL_COMMUNITY): Payer: Medicare Other

## 2016-01-02 DIAGNOSIS — F1010 Alcohol abuse, uncomplicated: Secondary | ICD-10-CM

## 2016-01-02 LAB — CBC
HCT: 35.9 % — ABNORMAL LOW (ref 39.0–52.0)
HEMOGLOBIN: 12.9 g/dL — AB (ref 13.0–17.0)
MCH: 43.3 pg — AB (ref 26.0–34.0)
MCHC: 35.9 g/dL (ref 30.0–36.0)
MCV: 120.5 fL — AB (ref 78.0–100.0)
Platelets: 99 10*3/uL — ABNORMAL LOW (ref 150–400)
RBC: 2.98 MIL/uL — AB (ref 4.22–5.81)
RDW: 15.7 % — ABNORMAL HIGH (ref 11.5–15.5)
WBC: 8.9 10*3/uL (ref 4.0–10.5)

## 2016-01-02 LAB — COMPREHENSIVE METABOLIC PANEL
ALK PHOS: 63 U/L (ref 38–126)
ALT: 202 U/L — AB (ref 17–63)
ANION GAP: 11 (ref 5–15)
AST: 575 U/L — ABNORMAL HIGH (ref 15–41)
Albumin: 2.8 g/dL — ABNORMAL LOW (ref 3.5–5.0)
BILIRUBIN TOTAL: 8.5 mg/dL — AB (ref 0.3–1.2)
BUN: 23 mg/dL — ABNORMAL HIGH (ref 6–20)
CALCIUM: 8.1 mg/dL — AB (ref 8.9–10.3)
CO2: 22 mmol/L (ref 22–32)
CREATININE: 1.02 mg/dL (ref 0.61–1.24)
Chloride: 103 mmol/L (ref 101–111)
Glucose, Bld: 79 mg/dL (ref 65–99)
Potassium: 3.5 mmol/L (ref 3.5–5.1)
SODIUM: 136 mmol/L (ref 135–145)
TOTAL PROTEIN: 5 g/dL — AB (ref 6.5–8.1)

## 2016-01-02 LAB — PROCALCITONIN: PROCALCITONIN: 0.42 ng/mL

## 2016-01-02 MED ORDER — PREDNISOLONE 5 MG PO TABS
20.0000 mg | ORAL_TABLET | Freq: Two times a day (BID) | ORAL | Status: DC
  Filled 2016-01-02: qty 4

## 2016-01-02 MED ORDER — LEVOFLOXACIN IN D5W 750 MG/150ML IV SOLN
750.0000 mg | INTRAVENOUS | Status: DC
  Administered 2016-01-02 – 2016-01-03 (×2): 750 mg via INTRAVENOUS
  Filled 2016-01-02 (×2): qty 150

## 2016-01-02 NOTE — ED Provider Notes (Signed)
Medical screening examination/treatment/procedure(s) were conducted as a shared visit with non-physician practitioner(s) and myself.  I personally evaluated the patient during the encounter. Briefly, the patient is a 70 yo M with PMHx of chronic alcoholism who p/w intractable nausea, vomiting, and mild abdominal pain. Labs c/f anion-gap metabolic acidosis, likely 2/2 lactic acidosis, SIRS with + leukocytosis, AKI, and transaminitis. Code sepsis protocol initiated with broad-spectrum ABX and 30 cc/kg fluid bolus. CT A/P wo contrast (due to AKI) shows no acute surgical pathology. Suspect SIRS 2/2 possible alcohol gastritis/hepatitis. Admit to Stepdown.  CRITICAL CARE Performed by: Evonnie Pat Total critical care time: 35 minutes Critical care time was exclusive of separately billable procedures and treating other patients. Critical care was necessary to treat or prevent imminent or life-threatening deterioration. Critical care was time spent personally by me on the following activities: development of treatment plan with patient and/or surrogate as well as nursing, discussions with consultants, evaluation of patient's response to treatment, examination of patient, obtaining history from patient or surrogate, ordering and performing treatments and interventions, ordering and review of laboratory studies, ordering and review of radiographic studies, pulse oximetry and re-evaluation of patient's condition.      Duffy Bruce, MD 01/02/16 1228

## 2016-01-02 NOTE — Progress Notes (Signed)
PULMONARY / CRITICAL CARE MEDICINE   Name: Sean Jackson MRN: [id number] DOB: April 23, 1946    ADMISSION DATE:  01/01/2016 CONSULTATION DATE:  01/01/16  REFERRING MD:  Dr. 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 Lamonte Sakai, known LLL nodule, interstitial lung disease (last seen 10/07/15 with consideration for VATS biopsy), chronic alcohol abuse, hypokalemia and hypoxia who presented to [facility] on 7/30 with reports of 48 hours of vomiting.    The patient reports he began feeling poorly 2 days ago.  He developed nausea and vomiting - reporting at least 7 episodes of vomiting. He has been unable to eat or drink. Reports loose stools but no diarrhea.  His last drink of alcohol was two weeks prior to admission.  He denied fevers, chills, abdominal pain / distention, prior abdominal surgery.  Reports he had a prior episode.  Initial ER evaluation was worrisome for sepsis.  Vitals 101/57, R 20, HR 114, 91% on RA.  Labs - Na 136, K 4.7, Cl92, BUN 26, Sr Cr 2.24, glucose 124, AG 17, Mg 1.8, AST 379 / ALT 115, lactic acid 4.66, WBC 18.6, Hgb 16.2, and platelets 116.  The patient was treated with 30 ml/kg volume resuscitation with improvement in blood pressure.  Initial CXR showed LLL atelectasis vs infiltrate vs chronic ILD changes.   PCCM called for evaluation of sepsis   SUBJECTIVE: Pt reports feeling significantly improved.  Denies SOB, abd pain, nausea / vomiting.  Ready to eat.  Afebrile. Today he reports last drink was 2 months ago (varying answers documented)  VITAL SIGNS: BP 115/66   Pulse 95   Temp 98.8 F (37.1 C) (Oral)   Resp 17   Ht 5\' 5"  (1.651 m)   Wt 140 lb 6.9 oz (63.7 kg)   SpO2 (!) 87%   BMI 23.37 kg/m   HEMODYNAMICS:    VENTILATOR SETTINGS:    INTAKE / OUTPUT: I/O last 3 completed shifts: In: 3361.3 [I.V.:3111.3; IV  Piggyback:250] Out: 250 [Urine:250]  PHYSICAL EXAMINATION: General: well developed male in no distress Neuro: alert, nonfocal, AAO x 4 HEENT: mm pink/dry, no jvd Cardiovascular:  s1 s2 RRR Lungs:  Non-labored, lungs bilaterally clear  Abdomen:  Soft, BS wnl, no r/g, no fluid wave Musculoskeletal:  No edema Skin: spider angioma face abdo etc, skin warm/dry  LABS:  BMET  Recent Labs Lab 01/01/16 1128 01/02/16 0330  NA 136 136  K 4.7 3.5  CL 92* 103  CO2 27 22  BUN 26* 23*  CREATININE 2.24* 1.02  GLUCOSE 124* 79    Electrolytes  Recent Labs Lab 01/01/16 1128 01/01/16 1130 01/02/16 0330  CALCIUM 9.7  --  8.1*  MG  --  1.8  --     CBC  Recent Labs Lab 01/01/16 1128 01/02/16 0330  WBC 18.6* 8.9  HGB 16.2 12.9*  HCT 46.2 35.9*  PLT 116* 99*    Coag's  Recent Labs Lab 01/01/16 1857  INR 2.03    Sepsis Markers  Recent Labs Lab 01/01/16 1137 01/01/16 1532 01/01/16 1815 01/02/16 0330  LATICACIDVEN 4.66* 2.01*  --   --   PROCALCITON  --   --  0.38 0.42    ABG No results for input(s): PHART, PCO2ART, PO2ART in the last 168 hours.  Liver Enzymes  Recent Labs Lab 01/01/16 1128  01/01/16 1638 01/02/16 0330  AST 379*  --  575*  ALT 115*  --  202*  ALKPHOS 87  --  63  BILITOT 7.2* 7.7* 8.5*  ALBUMIN 4.2  --  2.8*    Cardiac Enzymes No results for input(s): TROPONINI, PROBNP in the last 168 hours.  Glucose No results for input(s): GLUCAP in the last 168 hours.  Imaging Ct Abdomen Pelvis Wo Contrast  Result Date: 01/01/2016 CLINICAL DATA:  Abdominal pain and vomiting. EXAM: CT ABDOMEN AND PELVIS WITHOUT CONTRAST TECHNIQUE: Multidetector CT imaging of the abdomen and pelvis was performed following the standard protocol without IV contrast. COMPARISON:  None. FINDINGS: Lower chest: Diffuse peripheral interstitial coarsening bronchiectasis is noted at the lung bases bilaterally. No focal airspace consolidation is present. The heart is mildly  enlarged. Coronary artery calcifications are present. Hepatobiliary: Extensive fatty infiltration of the liver is noted. No discrete mass lesion is present. The liver contours are smooth. The common bile duct is within normal limits. Gallbladder is unremarkable. Pancreas: No significant inflammatory changes are present. No focal cystic or solid mass lesion is present. There is no ductal dilation. Spleen: Normal size in appearance Adrenals/Urinary Tract: The adrenal glands are normal bilaterally. The kidneys and ureters are unremarkable. No mass lesion or stone is present. There is mild perinephric stranding bilaterally. The urinary bladder is within normal limits. Stomach/Bowel: The stomach is mildly dilated with a fluid level. No obstructing lesion is present. The duodenum is within normal limits. The small bowel is unremarkable. Contrast extends to the lower mid abdomen without a discrete transition point. The more distal bowel and terminal ileum are within normal limits. The appendix is visualized and normal. The cecum is within normal limits. Diverticular changes are present within the ascending colon without focal inflammation. Diverticula are also present in the descending and sigmoid colon without focal inflammation. Vascular/Lymphatic: Atherosclerotic calcifications are present within the aorta and branch vessels without aneurysm. No significant adenopathy is present. Reproductive: Unremarkable Other: No free fluid or free air is present. Musculoskeletal: Grade 1 degenerative anterolisthesis is present at L4-5. There slight retrolisthesis at L2-3. No focal lytic or blastic lesions are present. Vertebral body heights are maintained. Advanced facet hypertrophy is present at L4-5 and L5-S1. The bony pelvis is intact. IMPRESSION: 1. Mild dilation of the stomach without an obstructing lesion. This is nonspecific and may be within normal limits. 2. Colonic diverticular changes within the ascending colon,  descending colon and proximal sigmoid colon without focal inflammation to suggest diverticulitis. 3. Diffuse hepatic steatosis. No discrete lesions or evidence for cirrhosis are present. 4. Grade 1 degenerative anterolisthesis at L4-5 secondary to advanced facet hypertrophy. Electronically Signed   By: San Morelle M.D.   On: 01/01/2016 12:50  Dg Chest Port 1 View  Result Date: 01/01/2016 CLINICAL DATA:  Sepsis EXAM: PORTABLE CHEST 1 VIEW COMPARISON:  CT 08/03/2015 FINDINGS: Heart is normal size. Left lower lobe atelectasis or infiltrate. No visible effusion. Right lung is clear. No acute bony abnormality. IMPRESSION: Left lower lobe atelectasis or infiltrate. Electronically Signed   By: Rolm Baptise M.D.   On: 01/01/2016 12:30  US Abdomen Limited Ruq  Result Date: 01/02/2016 CLINICAL DATA:  70 year old male with hyperbilirubinemia. Subsequent encounter. EXAM: US ABDOMEN LIMITED - RIGHT UPPER QUADRANT COMPARISON:  01/01/2016 CT. FINDINGS: Gallbladder: No gallstones. Gallbladder wall thickness normal 2 top-normal. No pericholecystic fluid. No sonographic Murphy sign noted by sonographer. Common bile duct: Diameter: 2.3 mm. Liver: Diffuse increased echogenicity consistent with fatty infiltration without  focal mass identified. Left lobe of the liver is poorly delineated secondary to overlying bowel gas. IMPRESSION: No gallstones. Gallbladder wall thickness normal to top normal. No pericholecystic fluid. No tenderness over the gallbladder during scanning. Diffuse fatty infiltration of the liver. Evaluation of the left lobe of the liver is limited by bowel gas. Proximal common bile duct is not dilated. Mid to distal aspect not visualized secondary to bowel gas. Electronically Signed   By: Genia Del M.D.   On: 01/02/2016 07:29  STUDIES:  CT ABD/Pelvis 7/30 >> mild dilation of the stomach without obstruction, colonic diverticular changes without suggestion of diverticulitis, diffuse hepatic steatosis  - no discrete lesions or evidence of cirrhosis RUQ [place] 7/30 >> no gallstones, gallbladder wall thickness normal to top normal, diffuse fatty infiltration of the liver  CULTURES: BCx2 7/30 >>  UC 7/30 >>   ANTIBIOTICS: Vanco (empiric sepsis) 7/30 >> 7/30 Zosyn (empiric sepsis) 7/30 >>7/30 levofloxacin 7/30 >>   SIGNIFICANT EVENTS: 7/30  Admit with n/v, concern for sepsis vs volume depletion  7/31  Improved with volume resuscitation  LINES/TUBES: Difficult PIV   ASSESSMENT / PLAN:  PULMONARY A: ILD Hx Unprovoked PE / DVT - pt was taken off anticoagulation 2016, negative hypercoag panel Known LLL Pulmonary Nodule  P:  O2 as needed to support saturations > 92% Prior findings account for slight LLL hazziness Pulmonary hygiene  Follow up with Dr. Lamonte Sakai as outpatient for known LLL nodule surveillance  Repeat CXR in am to ensure no developing infiltrate  CARDIOVASCULAR A:  Sinus Tachycardia - mild, likely related to volume depletion with n/v NO Sepsis noted Clearing lactic acid P:  Lactate clearing, no further assessment needed Continue IVF as ordered Tele monitoring   RENAL A:   Acute Kidney Injury - in setting of n/v, volume depletion Hypokalemia Anion Gap Acidosis - suspect component of lactic acid, resolving P:   Volume resuscitation, NS to 75 Trend BMP / UOP  Replace electrolytes as indicated  Tylenol, ETOH negative  INFECTIOUS A:   SIRS - no source sepsis NO nosocomial exposure last 90 days CT neg R/o gastroenteritis (viral) P:   Empiric abx as above Trend PCT to limit abx over 48-72 hours, likely can dc all abx if neg 0.5 pct x 2 and good clinical status Was given Vanc / zosyn, no nosocomial exposure, narrow mononotherpay empiric quinolone If dirarhia noted add flagyl  GI A: Nausea / Vomiting - r/o gastritis, etoh related etc Elevated LFT's - suspect in setting of ETOH abuse , innate liver dz?  P:  Diet as tolerated PPI BID PRN zofran  Trend  LFT's  Follow acute hepatitis panel  Concern is worsening innate liver fxn unrelated - consider GI consult ( he denies etoh active for year, not sure bout that) Follow bilirubin  NEURO A: ETOH Abuse - varying answers regarding last drink, negative on admit.  P:  Monitor for withdrawal symptoms   FAMILY  - Updates: patient updated on plan of care 7/31.     PCCM will be available PRN.  Please call if new needs arise    Noe Gens, NP-C [facility] Pgr: [phone] or if no answer [phone] 01/02/2016, 8:18 AM   Nausea improved.  Denies chest pain, dyspnea, abd pain.  Alert, HR regular/tachy, no wheeze, Abd soft, no edema  WBC 8.9, Procalcitonin 0.42, Creatinine 1.02.  Assessment/plan: Gastroenteritis >> likely viral; improving. - continue IV fluids  Hx of ILD >> recent PFT showed mild  restriction but severe diffusion defect. Hx of lung nodule. - f/u with Dr. Lamonte Sakai as outpt  He has been scheduled for pulmonary office follow up with Dr. Lamonte Sakai on August 29 at 2:15 pm.  PCCM will sign off.  Please call if additional assistance is needed while he is in hospital.  [name], MD [place] 01/02/2016, 11:02 AM Pager:  [phone] After 3pm call: [phone]

## 2016-01-02 NOTE — Progress Notes (Signed)
PROGRESS NOTE  Sean Jackson [id number] DOB: Oct 22, 1945 DOA: 01/01/2016 PCP: Aretta Nip, MD   LOS: 1 day   Brief Narrative: 70 year old with ETOH abuse, presented to [facility] on [facility]/13 with persistent nausea and vomiting in the last couple days. Denies any blood in his emesis.   Assessment & Plan: Principal Problem:   SIRS (systemic inflammatory response syndrome) (HCC) Active Problems:   Alcohol abuse   Hyperbilirubinemia   Transaminitis   Pain   Emesis, persistent   AKI (acute kidney injury) ([place])   Increased anion gap metabolic acidosis    Probable acute alcoholic hepatitis - With elevated LFTs in a pattern consistent with alcohol abuse, elevated bilirubin - CT abdominal pelvis unremarkable - Right upper quadrant ultrasound without gallbladder pathology, without any CBD dilation. Doppler signal without any thrombus - Maddrey's discriminant score elevated at 50, start prednisolone - Gastroenterology consulted [facility], patient's PCP is Dr. Radene Ou) - acute hepatitis panel obtained, in process - less likely sepsis, continue antibiotics for 48h and d/c tomorrow if cultures remain negative  - possible that his SIRS criteria on admission were related to dehydration, no apparent infectious source for now  Alcohol abuse - Patient appears to be minimizing alcohol intake, he is not sure when his last alcoholic drink was, "maybe a week ago", denies any seizures with alcohol cessation. Later told me that his last drink may have been 4 days ago. - Monitor on CIWA - Alcohol level negative  Acute kidney injury - Creatinine on admission 2.24, received IV fluids, improved to 1.0 this morning  Thrombocytopenia - Likely in the setting of underlying liver disease/ongoing alcohol abuse - No bleeding is apparent  Suspect underlying cirrhosis - With thrombocytopenia, elevated bilirubin, elevated INR    DVT prophylaxis: SCDs Code Status: Full code Family  Communication: No family at bedside Disposition Plan: Remaining step down today  Consultants:   Gastroenterology, equal group  Procedures:   None   Antimicrobials:  Levaquin 7/30 >>  Metronidazole 7/30 >>   Subjective: - He is feeling much better this morning, no further nausea, appears to be tremulous   Objective: Vitals:   01/01/16 2011 01/02/16 0037 01/02/16 0419 01/02/16 0800  BP:      Pulse:      Resp:      Temp: 98.8 F (37.1 C) 98.7 F (37.1 C) 98.8 F (37.1 C) 97.5 F (36.4 C)  TempSrc: Oral Oral Oral Oral  SpO2:      Weight:      Height:        Intake/Output Summary (Last 24 hours) at 01/02/16 1015 Last data filed at 01/02/16 0933  Gross per 24 hour  Intake          3739.25 ml  Output              500 ml  Net          3239.25 ml   Filed Weights   01/01/16 1210 01/01/16 1600  Weight: 68 kg (150 lb) 63.7 kg (140 lb 6.9 oz)    Examination: Constitutional: NAD, tremulous Vitals:   01/01/16 2011 01/02/16 0037 01/02/16 0419 01/02/16 0800  BP:      Pulse:      Resp:      Temp: 98.8 F (37.1 C) 98.7 F (37.1 C) 98.8 F (37.1 C) 97.5 F (36.4 C)  TempSrc: Oral Oral Oral Oral  SpO2:      Weight:      Height:  Eyes: PERRL, + scleral icterus ENMT: Mucous membranes are moist Respiratory: clear to auscultation bilaterally, no wheezing, no crackles. Normal respiratory effort Cardiovascular: Regular rate and rhythm, no murmurs / rubs / gallops. No LE edema.  Abdomen: no tenderness. Bowel sounds positive.  Neurologic: non focal  Psychiatric: Normal judgment and insight. Alert and oriented x 3. Normal mood.    Data Reviewed: I have personally reviewed following labs and imaging studies  CBC:  Recent Labs Lab 01/01/16 1128 01/02/16 0330  WBC 18.6* 8.9  HGB 16.2 12.9*  HCT 46.2 35.9*  MCV 123.2* 120.5*  PLT 116* 99*   Basic Metabolic Panel:  Recent Labs Lab 01/01/16 1128 01/01/16 1130 01/02/16 0330  NA 136  --  136  K 4.7  --   3.5  CL 92*  --  103  CO2 27  --  22  GLUCOSE 124*  --  79  BUN 26*  --  23*  CREATININE 2.24*  --  1.02  CALCIUM 9.7  --  8.1*  MG  --  1.8  --    GFR: Estimated Creatinine Clearance: 57.8 mL/min (by C-G formula based on SCr of 1.02 mg/dL). Liver Function Tests:  Recent Labs Lab 01/01/16 1128 01/01/16 1638 01/02/16 0330  AST 379*  --  575*  ALT 115*  --  202*  ALKPHOS 87  --  63  BILITOT 7.2* 7.7* 8.5*  PROT 7.1  --  5.0*  ALBUMIN 4.2  --  2.8*    Recent Labs Lab 01/01/16 1128  LIPASE 68*   No results for input(s): AMMONIA in the last 168 hours. Coagulation Profile:  Recent Labs Lab 01/01/16 1857  INR 2.03   Cardiac Enzymes: No results for input(s): CKTOTAL, CKMB, CKMBINDEX, TROPONINI in the last 168 hours. BNP (last 3 results) No results for input(s): PROBNP in the last 8760 hours. HbA1C: No results for input(s): HGBA1C in the last 72 hours. CBG: No results for input(s): GLUCAP in the last 168 hours. Lipid Profile: No results for input(s): CHOL, HDL, LDLCALC, TRIG, CHOLHDL, LDLDIRECT in the last 72 hours. Thyroid Function Tests: No results for input(s): TSH, T4TOTAL, FREET4, T3FREE, THYROIDAB in the last 72 hours. Anemia Panel: No results for input(s): VITAMINB12, FOLATE, FERRITIN, TIBC, IRON, RETICCTPCT in the last 72 hours. Urine analysis:    Component Value Date/Time   COLORURINE AMBER (A) 05/18/2013 0830   APPEARANCEUR CLEAR 05/18/2013 0830   LABSPEC >1.046 (H) 05/18/2013 0830   PHURINE 5.5 05/18/2013 0830   GLUCOSEU NEGATIVE 05/18/2013 0830   HGBUR NEGATIVE 05/18/2013 0830   BILIRUBINUR NEGATIVE 05/18/2013 0830   KETONESUR NEGATIVE 05/18/2013 0830   PROTEINUR NEGATIVE 05/18/2013 0830   UROBILINOGEN 1.0 05/18/2013 0830   NITRITE NEGATIVE 05/18/2013 0830   LEUKOCYTESUR NEGATIVE 05/18/2013 0830   Sepsis Labs: Invalid input(s): PROCALCITONIN, LACTICIDVEN  Recent Results (from the past 240 hour(s))  Blood Culture (routine x 2)     Status:  None (Preliminary result)   Collection Time: 01/01/16 11:53 AM  Result Value Ref Range Status   Specimen Description BLOOD RIGHT ANTECUBITAL  Final   Special Requests   Final    BOTTLES DRAWN AEROBIC AND ANAEROBIC 5 CC Performed at [facility]    Culture PENDING  Incomplete   Report Status PENDING  Incomplete  MRSA PCR Screening     Status: None   Collection Time: 01/01/16  4:09 PM  Result Value Ref Range Status   MRSA by PCR NEGATIVE NEGATIVE Final    Comment:  The GeneXpert MRSA Assay (FDA approved for NASAL specimens only), is one component of a comprehensive MRSA colonization surveillance program. It is not intended to diagnose MRSA infection nor to guide or monitor treatment for MRSA infections.       Radiology Studies: Ct Abdomen Pelvis Wo Contrast  Result Date: 01/01/2016 CLINICAL DATA:  Abdominal pain and vomiting. EXAM: CT ABDOMEN AND PELVIS WITHOUT CONTRAST TECHNIQUE: Multidetector CT imaging of the abdomen and pelvis was performed following the standard protocol without IV contrast. COMPARISON:  None. FINDINGS: Lower chest: Diffuse peripheral interstitial coarsening bronchiectasis is noted at the lung bases bilaterally. No focal airspace consolidation is present. The heart is mildly enlarged. Coronary artery calcifications are present. Hepatobiliary: Extensive fatty infiltration of the liver is noted. No discrete mass lesion is present. The liver contours are smooth. The common bile duct is within normal limits. Gallbladder is unremarkable. Pancreas: No significant inflammatory changes are present. No focal cystic or solid mass lesion is present. There is no ductal dilation. Spleen: Normal size in appearance Adrenals/Urinary Tract: The adrenal glands are normal bilaterally. The kidneys and ureters are unremarkable. No mass lesion or stone is present. There is mild perinephric stranding bilaterally. The urinary bladder is within normal limits. Stomach/Bowel: The  stomach is mildly dilated with a fluid level. No obstructing lesion is present. The duodenum is within normal limits. The small bowel is unremarkable. Contrast extends to the lower mid abdomen without a discrete transition point. The more distal bowel and terminal ileum are within normal limits. The appendix is visualized and normal. The cecum is within normal limits. Diverticular changes are present within the ascending colon without focal inflammation. Diverticula are also present in the descending and sigmoid colon without focal inflammation. Vascular/Lymphatic: Atherosclerotic calcifications are present within the aorta and branch vessels without aneurysm. No significant adenopathy is present. Reproductive: Unremarkable Other: No free fluid or free air is present. Musculoskeletal: Grade 1 degenerative anterolisthesis is present at L4-5. There slight retrolisthesis at L2-3. No focal lytic or blastic lesions are present. Vertebral body heights are maintained. Advanced facet hypertrophy is present at L4-5 and L5-S1. The bony pelvis is intact. IMPRESSION: 1. Mild dilation of the stomach without an obstructing lesion. This is nonspecific and may be within normal limits. 2. Colonic diverticular changes within the ascending colon, descending colon and proximal sigmoid colon without focal inflammation to suggest diverticulitis. 3. Diffuse hepatic steatosis. No discrete lesions or evidence for cirrhosis are present. 4. Grade 1 degenerative anterolisthesis at L4-5 secondary to advanced facet hypertrophy. Electronically Signed   By: San Morelle M.D.   On: 01/01/2016 12:50  [place] Art/ven Flow Abd Pelv Doppler  Result Date: 01/02/2016 CLINICAL DATA:  Elevated LFTs. Fatty liver suggested on earlier ultrasound. EXAM: DUPLEX ULTRASOUND OF LIVER TECHNIQUE: Color and duplex Doppler ultrasound was performed to evaluate the hepatic in-flow and out-flow vessels. COMPARISON:  CT from previous day FINDINGS: Portal Vein: No  occlusion or thrombus. Hepatopetal flow. Velocities Main:  20-29 cm/sec Right:  28 cm/sec Left:  23 cm/sec Hepatic Vein Velocities Right:  Hepatofugal, 19 cm/sec Middle:  Hepatofugal, 16 cm/sec Left:  Not visualized secondary to overlying structures. Hepatic Artery Velocity:  58 cm/sec Spleen 5.6 x 7.3 x 3.6 cm (volume = 76.5 cc). Splenic Vein, no occlusion or thrombus. Velocity: 25 cm/sec Varices: None identified Ascites: Absent IMPRESSION: 1. Unremarkable hepatic Doppler evaluation. Left hepatic vein was obscured by overlying structures. Electronically Signed   By: Lucrezia Europe M.D.   On: 01/02/2016 10:09  Dg  Chest Port 1 View  Result Date: 01/01/2016 CLINICAL DATA:  Sepsis EXAM: PORTABLE CHEST 1 VIEW COMPARISON:  CT 08/03/2015 FINDINGS: Heart is normal size. Left lower lobe atelectasis or infiltrate. No visible effusion. Right lung is clear. No acute bony abnormality. IMPRESSION: Left lower lobe atelectasis or infiltrate. Electronically Signed   By: Rolm Baptise M.D.   On: 01/01/2016 12:30  US Abdomen Limited Ruq  Result Date: 01/02/2016 CLINICAL DATA:  70 year old male with hyperbilirubinemia. Subsequent encounter. EXAM: US ABDOMEN LIMITED - RIGHT UPPER QUADRANT COMPARISON:  01/01/2016 CT. FINDINGS: Gallbladder: No gallstones. Gallbladder wall thickness normal 2 top-normal. No pericholecystic fluid. No sonographic Murphy sign noted by sonographer. Common bile duct: Diameter: 2.3 mm. Liver: Diffuse increased echogenicity consistent with fatty infiltration without focal mass identified. Left lobe of the liver is poorly delineated secondary to overlying bowel gas. IMPRESSION: No gallstones. Gallbladder wall thickness normal to top normal. No pericholecystic fluid. No tenderness over the gallbladder during scanning. Diffuse fatty infiltration of the liver. Evaluation of the left lobe of the liver is limited by bowel gas. Proximal common bile duct is not dilated. Mid to distal aspect not visualized secondary to  bowel gas. Electronically Signed   By: Genia Del M.D.   On: 01/02/2016 07:29    Scheduled Meds: . levofloxacin (LEVAQUIN) IV  750 mg Intravenous Q24H  . metronidazole  500 mg Intravenous Q8H  . ondansetron (ZOFRAN) IV  4 mg Intravenous Q6H  . pantoprazole  40 mg Oral BID  . sodium chloride flush  3 mL Intravenous Q12H   Continuous Infusions: . sodium chloride 75 mL/hr at 01/01/16 2132    Marzetta Board, MD, PhD [facility] Pager [phone]  If 7PM-7AM, please contact night-coverage [email or web address] Password [email or web address] 01/02/2016, 10:15 AM

## 2016-01-02 NOTE — Consult Note (Signed)
[place] Reason for consult: nausea and vomiting with abnormal liver test Referring Physician: [facility]. PCP: Dr. Radene Ou. Primary G.I.: none  Sean Jackson is an 70 y.o. male.  HPI: He was admitted with approximately 2 weeks of nausea and vomiting. This was non-bloody vomitus. He has a history fairly heavy alcohol use in the past. He reports that he cut back significantly about 6 months ago and approximately 5 days a week will have one drink never more than 2. For the last 2 weeks he's had no alcohol. He's not had abdominal pain, melena, or hematochezia. He denies the use of any medications at all including OTC medicines. He previously had DVT and pulmonary embolus and was on anticoagulation in the past which is subsequently been stopped. He was admitted and felt to be septic for unclear reasons and met the S IRS criteria started on antibiotics and feels much much better. He was found to have Mark elevation of liver test. ALT 202 AST 575 total bilirubin 8.5 normal alkaline phosphatase. PT elevated at 23. Patient on Coumadin in the past but none for some time. His alcohol level was nondetectable. Patient adamantly denies the use of any medications or any alcohol other than 1 to 2 drinks a day none for the past 2 weeks. He is not taking any herbs or any other medications at all and the only medicine is taking is a multivitamin. 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 He notes that he feels much much better today. Imaging studies have shown diffuse fatty liver on CT scan but no grossly dilated ducts. Diverticulosis without diverticulitis. Ultrasound showed no gallstones are dilated ducts diffuse fatty liver. Doppler evaluation showed no evidence of portal vein thrombosis with good flow. Patient said rate was normal. Hepatitis panel has been drawn in his pending at this time.   Past Medical History:   Diagnosis Date  . Closed fracture of left wrist   . DVT   . ETOH abuse   . Hypokalemia   . Interstitial lung disease ([place])   . Pulmonary embolism     Past Surgical History:  Procedure Laterality Date  . ANKLE FRACTURE SURGERY    . LEFT HAND SURGERY    . RIGHT HEART CATHETERIZATION N/A 05/19/2013   Procedure: RIGHT HEART CATH;  Surgeon: Jolaine Artist, MD;  Location: [facility] CATH LAB;  Service: Cardiovascular;  Laterality: N/A;    Family History  Problem Relation Age of Onset  . Heart attack Father 50    Social History:  reports that he has never smoked. He has never used smokeless tobacco. He reports that he drinks about 0.5 oz of alcohol per week . He reports that he does not use drugs.  Allergies: No Known Allergies  Medications; Prior to Admission medications   Medication Sig Start Date End Date Taking? Authorizing Provider  Multiple Vitamins-Minerals (MULTIVITAMIN WITH MINERALS) tablet Take 1 tablet by mouth daily. 1 tablet my mouth daily   Yes Historical Provider, MD   . levofloxacin (LEVAQUIN) IV  750 mg Intravenous Q24H  . metronidazole  500 mg Intravenous Q8H  . ondansetron (ZOFRAN) IV  4 mg Intravenous Q6H  . pantoprazole  40 mg Oral BID  . sodium chloride flush  3 mL Intravenous Q12H   PRN Meds LORazepam, ondansetron **OR** ondansetron (ZOFRAN) IV Results for orders placed or performed during the hospital encounter of 01/01/16 (from the past 48 hour(s))  Lipase, blood  Status: Abnormal   Collection Time: 01/01/16 11:28 AM  Result Value Ref Range   Lipase 68 (H) 11 - 51 U/Jackson  Comprehensive metabolic panel     Status: Abnormal   Collection Time: 01/01/16 11:28 AM  Result Value Ref Range   Sodium 136 135 - 145 mmol/Jackson   Potassium 4.7 3.5 - 5.1 mmol/Jackson   Chloride 92 (Jackson) 101 - 111 mmol/Jackson   CO2 27 22 - 32 mmol/Jackson   Glucose, Bld 124 (H) 65 - 99 mg/dL   BUN 26 (H) 6 - 20 mg/dL   Creatinine, Ser 2.24 (H) 0.61 - 1.24 mg/dL   Calcium 9.7 8.9 - 10.3 mg/dL   Total  Protein 7.1 6.5 - 8.1 g/dL   Albumin 4.2 3.5 - 5.0 g/dL   AST 379 (H) 15 - 41 U/Jackson   ALT 115 (H) 17 - 63 U/Jackson   Alkaline Phosphatase 87 38 - 126 U/Jackson   Total Bilirubin 7.2 (H) 0.3 - 1.2 mg/dL   GFR calc non Af Amer 28 (Jackson) >60 mL/min   GFR calc Af Amer 32 (Jackson) >60 mL/min    Comment: (NOTE) The eGFR has been calculated using the CKD EPI equation. This calculation has not been validated in all clinical situations. eGFR's persistently <60 mL/min signify possible Chronic Kidney Disease.    Anion gap 17 (H) 5 - 15  CBC     Status: Abnormal   Collection Time: 01/01/16 11:28 AM  Result Value Ref Range   WBC 18.6 (H) 4.0 - 10.5 K/uL   RBC 3.75 (Jackson) 4.22 - 5.81 MIL/uL   Hemoglobin 16.2 13.0 - 17.0 g/dL   HCT 46.2 39.0 - 52.0 %   MCV 123.2 (H) 78.0 - 100.0 fL   MCH 43.2 (H) 26.0 - 34.0 pg   MCHC 35.1 30.0 - 36.0 g/dL   RDW 16.2 (H) 11.5 - 15.5 %   Platelets 116 (Jackson) 150 - 400 K/uL    Comment: REPEATED TO VERIFY SPECIMEN CHECKED FOR CLOTS PLATELET COUNT CONFIRMED BY SMEAR   Magnesium     Status: None   Collection Time: 01/01/16 11:30 AM  Result Value Ref Range   Magnesium 1.8 1.7 - 2.4 mg/dL  I-Stat CG4 Lactic Acid, ED     Status: Abnormal   Collection Time: 01/01/16 11:37 AM  Result Value Ref Range   Lactic Acid, Venous 4.66 (HH) 0.5 - 1.9 mmol/Jackson   Comment NOTIFIED PHYSICIAN   Blood Culture (routine x 2)     Status: None (Preliminary result)   Collection Time: 01/01/16 11:53 AM  Result Value Ref Range   Specimen Description BLOOD RIGHT ANTECUBITAL    Special Requests      BOTTLES DRAWN AEROBIC AND ANAEROBIC 5 CC Performed at [facility]    Culture PENDING    Report Status PENDING   I-Stat CG4 Lactic Acid, ED  (not at  [facility])     Status: Abnormal   Collection Time: 01/01/16  3:32 PM  Result Value Ref Range   Lactic Acid, Venous 2.01 (HH) 0.5 - 1.9 mmol/Jackson   Comment NOTIFIED PHYSICIAN   MRSA PCR Screening     Status: None   Collection Time: 01/01/16  4:09 PM  Result Value  Ref Range   MRSA by PCR NEGATIVE NEGATIVE    Comment:        The GeneXpert MRSA Assay (FDA approved for NASAL specimens only), is one component of a comprehensive MRSA colonization surveillance program. It is not intended to  diagnose MRSA infection nor to guide or monitor treatment for MRSA infections.   Bilirubin, fractionated(tot/dir/indir)     Status: Abnormal   Collection Time: 01/01/16  4:38 PM  Result Value Ref Range   Total Bilirubin 7.7 (H) 0.3 - 1.2 mg/dL   Bilirubin, Direct 4.6 (H) 0.1 - 0.5 mg/dL   Indirect Bilirubin 3.1 (H) 0.3 - 0.9 mg/dL  Procalcitonin - Baseline     Status: None   Collection Time: 01/01/16  6:15 PM  Result Value Ref Range   Procalcitonin 0.38 ng/mL    Comment:        Interpretation: PCT (Procalcitonin) <= 0.5 ng/mL: Systemic infection (sepsis) is not likely. Local bacterial infection is possible. (NOTE)         ICU PCT Algorithm               Non ICU PCT Algorithm    ----------------------------     ------------------------------         PCT < 0.25 ng/mL                 PCT < 0.1 ng/mL     Stopping of antibiotics            Stopping of antibiotics       strongly encouraged.               strongly encouraged.    ----------------------------     ------------------------------       PCT level decrease by               PCT < 0.25 ng/mL       >= 80% from peak PCT       OR PCT 0.25 - 0.5 ng/mL          Stopping of antibiotics                                             encouraged.     Stopping of antibiotics           encouraged.    ----------------------------     ------------------------------       PCT level decrease by              PCT >= 0.25 ng/mL       < 80% from peak PCT        AND PCT >= 0.5 ng/mL            Continuin g antibiotics                                              encouraged.       Continuing antibiotics            encouraged.    ----------------------------     ------------------------------     PCT level increase  compared          PCT > 0.5 ng/mL         with peak PCT AND          PCT >= 0.5 ng/mL             Escalation of antibiotics  strongly encouraged.      Escalation of antibiotics        strongly encouraged.   Ethanol     Status: None   Collection Time: 01/01/16  6:15 PM  Result Value Ref Range   Alcohol, Ethyl (B) <5 <5 mg/dL    Comment:        LOWEST DETECTABLE LIMIT FOR SERUM ALCOHOL IS 5 mg/dL FOR MEDICAL PURPOSES ONLY   Acetaminophen level     Status: Abnormal   Collection Time: 01/01/16  6:15 PM  Result Value Ref Range   Acetaminophen (Tylenol), Serum <10 (Jackson) 10 - 30 ug/mL    Comment:        THERAPEUTIC CONCENTRATIONS VARY SIGNIFICANTLY. A RANGE OF 10-30 ug/mL MAY BE AN EFFECTIVE CONCENTRATION FOR MANY PATIENTS. HOWEVER, SOME ARE BEST TREATED AT CONCENTRATIONS OUTSIDE THIS RANGE. ACETAMINOPHEN CONCENTRATIONS >150 ug/mL AT 4 HOURS AFTER INGESTION AND >50 ug/mL AT 12 HOURS AFTER INGESTION ARE OFTEN ASSOCIATED WITH TOXIC REACTIONS.   Sedimentation rate     Status: None   Collection Time: 01/01/16  6:15 PM  Result Value Ref Range   Sed Rate 1 0 - 16 mm/hr  Protime-INR     Status: Abnormal   Collection Time: 01/01/16  6:57 PM  Result Value Ref Range   Prothrombin Time 23.3 (H) 11.4 - 15.2 seconds   INR 2.03   Procalcitonin     Status: None   Collection Time: 01/02/16  3:30 AM  Result Value Ref Range   Procalcitonin 0.42 ng/mL    Comment:        Interpretation: PCT (Procalcitonin) <= 0.5 ng/mL: Systemic infection (sepsis) is not likely. Local bacterial infection is possible. (NOTE)         ICU PCT Algorithm               Non ICU PCT Algorithm    ----------------------------     ------------------------------         PCT < 0.25 ng/mL                 PCT < 0.1 ng/mL     Stopping of antibiotics            Stopping of antibiotics       strongly encouraged.               strongly encouraged.    ----------------------------      ------------------------------       PCT level decrease by               PCT < 0.25 ng/mL       >= 80% from peak PCT       OR PCT 0.25 - 0.5 ng/mL          Stopping of antibiotics                                             encouraged.     Stopping of antibiotics           encouraged.    ----------------------------     ------------------------------       PCT level decrease by              PCT >= 0.25 ng/mL       < 80% from peak PCT        AND PCT >= 0.5  ng/mL            Continuin g antibiotics                                              encouraged.       Continuing antibiotics            encouraged.    ----------------------------     ------------------------------     PCT level increase compared          PCT > 0.5 ng/mL         with peak PCT AND          PCT >= 0.5 ng/mL             Escalation of antibiotics                                          strongly encouraged.      Escalation of antibiotics        strongly encouraged.   CBC     Status: Abnormal   Collection Time: 01/02/16  3:30 AM  Result Value Ref Range   WBC 8.9 4.0 - 10.5 K/uL   RBC 2.98 (Jackson) 4.22 - 5.81 MIL/uL   Hemoglobin 12.9 (Jackson) 13.0 - 17.0 g/dL    Comment: RESULT REPEATED AND VERIFIED DELTA CHECK NOTED    HCT 35.9 (Jackson) 39.0 - 52.0 %   MCV 120.5 (H) 78.0 - 100.0 fL   MCH 43.3 (H) 26.0 - 34.0 pg   MCHC 35.9 30.0 - 36.0 g/dL   RDW 15.7 (H) 11.5 - 15.5 %   Platelets 99 (Jackson) 150 - 400 K/uL    Comment: CONSISTENT WITH PREVIOUS RESULT  Comprehensive metabolic panel     Status: Abnormal   Collection Time: 01/02/16  3:30 AM  Result Value Ref Range   Sodium 136 135 - 145 mmol/Jackson   Potassium 3.5 3.5 - 5.1 mmol/Jackson    Comment: RESULT REPEATED AND VERIFIED DELTA CHECK NOTED    Chloride 103 101 - 111 mmol/Jackson   CO2 22 22 - 32 mmol/Jackson   Glucose, Bld 79 65 - 99 mg/dL   BUN 23 (H) 6 - 20 mg/dL   Creatinine, Ser 1.02 0.61 - 1.24 mg/dL    Comment: RESULT REPEATED AND VERIFIED DELTA CHECK NOTED    Calcium 8.1 (Jackson) 8.9 -  10.3 mg/dL   Total Protein 5.0 (Jackson) 6.5 - 8.1 g/dL   Albumin 2.8 (Jackson) 3.5 - 5.0 g/dL   AST 575 (H) 15 - 41 U/Jackson   ALT 202 (H) 17 - 63 U/Jackson   Alkaline Phosphatase 63 38 - 126 U/Jackson   Total Bilirubin 8.5 (H) 0.3 - 1.2 mg/dL   GFR calc non Af Amer >60 >60 mL/min   GFR calc Af Amer >60 >60 mL/min    Comment: (NOTE) The eGFR has been calculated using the CKD EPI equation. This calculation has not been validated in all clinical situations. eGFR's persistently <60 mL/min signify possible Chronic Kidney Disease.    Anion gap 11 5 - 15    Ct Abdomen Pelvis Wo Contrast  Result Date: 01/01/2016 CLINICAL DATA:  Abdominal pain and vomiting. EXAM: CT ABDOMEN AND PELVIS WITHOUT CONTRAST TECHNIQUE: Multidetector CT imaging of the abdomen and  pelvis was performed following the standard protocol without IV contrast. COMPARISON:  None. FINDINGS: Lower chest: Diffuse peripheral interstitial coarsening bronchiectasis is noted at the lung bases bilaterally. No focal airspace consolidation is present. The heart is mildly enlarged. Coronary artery calcifications are present. Hepatobiliary: Extensive fatty infiltration of the liver is noted. No discrete mass lesion is present. The liver contours are smooth. The common bile duct is within normal limits. Gallbladder is unremarkable. Pancreas: No significant inflammatory changes are present. No focal cystic or solid mass lesion is present. There is no ductal dilation. Spleen: Normal size in appearance Adrenals/Urinary Tract: The adrenal glands are normal bilaterally. The kidneys and ureters are unremarkable. No mass lesion or stone is present. There is mild perinephric stranding bilaterally. The urinary bladder is within normal limits. Stomach/Bowel: The stomach is mildly dilated with a fluid level. No obstructing lesion is present. The duodenum is within normal limits. The small bowel is unremarkable. Contrast extends to the lower mid abdomen without a discrete transition point.  The more distal bowel and terminal ileum are within normal limits. The appendix is visualized and normal. The cecum is within normal limits. Diverticular changes are present within the ascending colon without focal inflammation. Diverticula are also present in the descending and sigmoid colon without focal inflammation. Vascular/Lymphatic: Atherosclerotic calcifications are present within the aorta and branch vessels without aneurysm. No significant adenopathy is present. Reproductive: Unremarkable Other: No free fluid or free air is present. Musculoskeletal: Grade 1 degenerative anterolisthesis is present at L4-5. There slight retrolisthesis at L2-3. No focal lytic or blastic lesions are present. Vertebral body heights are maintained. Advanced facet hypertrophy is present at L4-5 and L5-S1. The bony pelvis is intact. IMPRESSION: 1. Mild dilation of the stomach without an obstructing lesion. This is nonspecific and may be within normal limits. 2. Colonic diverticular changes within the ascending colon, descending colon and proximal sigmoid colon without focal inflammation to suggest diverticulitis. 3. Diffuse hepatic steatosis. No discrete lesions or evidence for cirrhosis are present. 4. Grade 1 degenerative anterolisthesis at L4-5 secondary to advanced facet hypertrophy. Electronically Signed   By: San Morelle M.D.   On: 01/01/2016 12:50  [place] Art/ven Flow Abd Pelv Doppler  Result Date: 01/02/2016 CLINICAL DATA:  Elevated LFTs. Fatty liver suggested on earlier ultrasound. EXAM: DUPLEX ULTRASOUND OF LIVER TECHNIQUE: Color and duplex Doppler ultrasound was performed to evaluate the hepatic in-flow and out-flow vessels. COMPARISON:  CT from previous day FINDINGS: Portal Vein: No occlusion or thrombus. Hepatopetal flow. Velocities Main:  20-29 cm/sec Right:  28 cm/sec Left:  23 cm/sec Hepatic Vein Velocities Right:  Hepatofugal, 19 cm/sec Middle:  Hepatofugal, 16 cm/sec Left:  Not visualized secondary to  overlying structures. Hepatic Artery Velocity:  58 cm/sec Spleen 5.6 x 7.3 x 3.6 cm (volume = 76.5 cc). Splenic Vein, no occlusion or thrombus. Velocity: 25 cm/sec Varices: None identified Ascites: Absent IMPRESSION: 1. Unremarkable hepatic Doppler evaluation. Left hepatic vein was obscured by overlying structures. Electronically Signed   By: Lucrezia Europe M.D.   On: 01/02/2016 10:09  Dg Chest Port 1 View  Result Date: 01/01/2016 CLINICAL DATA:  Sepsis EXAM: PORTABLE CHEST 1 VIEW COMPARISON:  CT 08/03/2015 FINDINGS: Heart is normal size. Left lower lobe atelectasis or infiltrate. No visible effusion. Right lung is clear. No acute bony abnormality. IMPRESSION: Left lower lobe atelectasis or infiltrate. Electronically Signed   By: Rolm Baptise M.D.   On: 01/01/2016 12:30  US Abdomen Limited Ruq  Result Date: 01/02/2016 CLINICAL DATA:  70 year old male with hyperbilirubinemia. Subsequent encounter. EXAM: US ABDOMEN LIMITED - RIGHT UPPER QUADRANT COMPARISON:  01/01/2016 CT. FINDINGS: Gallbladder: No gallstones. Gallbladder wall thickness normal 2 top-normal. No pericholecystic fluid. No sonographic Murphy sign noted by sonographer. Common bile duct: Diameter: 2.3 mm. Liver: Diffuse increased echogenicity consistent with fatty infiltration without focal mass identified. Left lobe of the liver is poorly delineated secondary to overlying bowel gas. IMPRESSION: No gallstones. Gallbladder wall thickness normal to top normal. No pericholecystic fluid. No tenderness over the gallbladder during scanning. Diffuse fatty infiltration of the liver. Evaluation of the left lobe of the liver is limited by bowel gas. Proximal common bile duct is not dilated. Mid to distal aspect not visualized secondary to bowel gas. Electronically Signed   By: Genia Del M.D.   On: 01/02/2016 07:29              Blood pressure 115/66, pulse 95, temperature 97.5 F (36.4 C), temperature source Oral, resp. rate 17, height 5' 5"   (1.651 m), weight 63.7 kg (140 lb 6.9 oz), SpO2 (!) 87 %.  Physical exam:   General-- nonicteric white male  Neck-- no lymphadenopathy Heart-- regular rate and rhythm without murmurs are gallops Lungs-- clear Abdomen-- nondistended soft and nontender with no ascites Psych-- alert and oriented answers questions appropriately   Assessment: 1. Abnormal LFTs. This could well be due to alcohol in it so his Maddrey discriminant function is 60 and would warrant the use of steroids. He had no alcohol in his blood and does state that he has had minimal alcohol in 6 months and none in 2 weeks. I'm hesitant to start him on steroids until other causes of been ruled out such as viral hepatitis. Those results are still pending. He denies the use of any IV drugs or any connection with anyone with nonviral hepatitis. 2. Nausea and vomiting. This appears to have resolved.  Plan: 1 we will slowly advance his diet observed clinically. 2. Repeat liver test in the morning and wait for the results of the viral hepatitis markers. Also check for autoimmune liver disease etc. If all these things come back negative and we must assume this is in fact alcoholic liver disease I would go ahead and start him on a course of prednisone or prednisolone.   Sean Jackson,Sean Jackson 01/02/2016, 12:25 PM   This note was created using voice recognition software and minor errors may Have occurred unintentionally. Pager: [phone] If no answer or after hours call [phone]

## 2016-01-03 ENCOUNTER — Encounter (HOSPITAL_COMMUNITY): Payer: Self-pay | Admitting: *Deleted

## 2016-01-03 LAB — PROTIME-INR
INR: 2.03
PROTHROMBIN TIME: 23.3 s — AB (ref 11.4–15.2)

## 2016-01-03 LAB — COMPREHENSIVE METABOLIC PANEL
ALBUMIN: 2.5 g/dL — AB (ref 3.5–5.0)
ALT: 363 U/L — ABNORMAL HIGH (ref 17–63)
ANION GAP: 7 (ref 5–15)
AST: 824 U/L — ABNORMAL HIGH (ref 15–41)
Alkaline Phosphatase: 63 U/L (ref 38–126)
BILIRUBIN TOTAL: 11.2 mg/dL — AB (ref 0.3–1.2)
BUN: 16 mg/dL (ref 6–20)
CO2: 28 mmol/L (ref 22–32)
Calcium: 7.9 mg/dL — ABNORMAL LOW (ref 8.9–10.3)
Chloride: 101 mmol/L (ref 101–111)
Creatinine, Ser: 0.49 mg/dL — ABNORMAL LOW (ref 0.61–1.24)
GFR calc non Af Amer: >60 mL/min (ref >60)
GLUCOSE: 97 mg/dL (ref 65–99)
POTASSIUM: 3.1 mmol/L — AB (ref 3.5–5.1)
SODIUM: 136 mmol/L (ref 135–145)
TOTAL PROTEIN: 4.7 g/dL — AB (ref 6.5–8.1)

## 2016-01-03 LAB — MITOCHONDRIAL ANTIBODIES: Mitochondrial M2 Ab, IgG: 8.3 Units (ref 0.0–20.0)

## 2016-01-03 LAB — CBC
HEMATOCRIT: 32.7 % — AB (ref 39.0–52.0)
HEMOGLOBIN: 11.4 g/dL — AB (ref 13.0–17.0)
MCH: 42.1 pg — ABNORMAL HIGH (ref 26.0–34.0)
MCHC: 34.9 g/dL (ref 30.0–36.0)
MCV: 120.7 fL — ABNORMAL HIGH (ref 78.0–100.0)
Platelets: 52 10*3/uL — ABNORMAL LOW (ref 150–400)
RBC: 2.71 MIL/uL — AB (ref 4.22–5.81)
RDW: 15.9 % — ABNORMAL HIGH (ref 11.5–15.5)
WBC: 4.9 10*3/uL (ref 4.0–10.5)

## 2016-01-03 LAB — HEPATITIS PANEL, ACUTE
HEP A IGM: NEGATIVE
HEP B C IGM: NEGATIVE
Hepatitis B Surface Ag: NEGATIVE

## 2016-01-03 LAB — PROCALCITONIN: Procalcitonin: 0.55 ng/mL

## 2016-01-03 LAB — PHOSPHORUS: PHOSPHORUS: 1.9 mg/dL — AB (ref 2.5–4.6)

## 2016-01-03 LAB — VITAMIN B12: Vitamin B-12: 1052 pg/mL — ABNORMAL HIGH (ref 180–914)

## 2016-01-03 LAB — ANTI-SMOOTH MUSCLE ANTIBODY, IGG: F-ACTIN AB IGG: 11 U (ref 0–19)

## 2016-01-03 LAB — ALPHA-1-ANTITRYPSIN: A1 ANTITRYPSIN SER: 126 mg/dL (ref 90–200)

## 2016-01-03 LAB — MAGNESIUM: Magnesium: 1.3 mg/dL — ABNORMAL LOW (ref 1.7–2.4)

## 2016-01-03 MED ORDER — POTASSIUM & SODIUM PHOSPHATES 280-160-250 MG PO PACK
1.0000 | PACK | Freq: Three times a day (TID) | ORAL | Status: AC
  Administered 2016-01-03 (×3): 1 via ORAL
  Filled 2016-01-03 (×3): qty 1

## 2016-01-03 MED ORDER — POTASSIUM CHLORIDE CRYS ER 20 MEQ PO TBCR: 40.0000 meq | EXTENDED_RELEASE_TABLET | ORAL | Status: DC

## 2016-01-03 MED ORDER — PREDNISOLONE 5 MG PO TABS
20.0000 mg | ORAL_TABLET | Freq: Two times a day (BID) | ORAL | Status: DC
  Administered 2016-01-03 – 2016-01-06 (×7): 20 mg via ORAL
  Filled 2016-01-03 (×7): qty 4

## 2016-01-03 MED ORDER — MAGNESIUM SULFATE 2 GM/50ML IV SOLN
2.0000 g | Freq: Once | INTRAVENOUS | Status: AC
  Administered 2016-01-03: 2 g via INTRAVENOUS
  Filled 2016-01-03: qty 50

## 2016-01-03 MED ORDER — POTASSIUM CHLORIDE CRYS ER 20 MEQ PO TBCR
40.0000 meq | EXTENDED_RELEASE_TABLET | Freq: Once | ORAL | Status: AC
  Administered 2016-01-03: 40 meq via ORAL
  Filled 2016-01-03: qty 2

## 2016-01-03 NOTE — Progress Notes (Signed)
Pt's O2 stats noted to decrease to the 80's on 2L O2 with getting out of bed to the bedside commode. O2 level increased to 4L and sats increased to the lower 90s. Pt extremely weak requiring 2 assist to use BSC and getting back to bed.

## 2016-01-03 NOTE — Progress Notes (Signed)
PROGRESS NOTE  Sean Jackson [id number] DOB: 10/28/1945 DOA: 01/01/2016 PCP: Aretta Nip, MD   LOS: 2 days   Brief Narrative: 70 year old with ETOH abuse, presented to [facility] on 7/13 with persistent nausea and vomiting in the last couple days. Denies any blood in his emesis. He was admitted to step down with elevated LFTs and gastroenterology was consulted.  Assessment & Plan: Principal Problem:   SIRS (systemic inflammatory response syndrome) (HCC) Active Problems:   Alcohol abuse   Hyperbilirubinemia   Transaminitis   Pain   Emesis, persistent   AKI (acute kidney injury) ([place])   Increased anion gap metabolic acidosis  Acute alcoholic hepatitis - With elevated LFTs in a pattern consistent with alcohol abuse, elevated bilirubin. LFTs look worse today as well as bilirubin. - CT abdominal pelvis unremarkable - Right upper quadrant ultrasound without gallbladder pathology, without any CBD dilation. Doppler signal without any thrombus - Maddrey's discriminant score elevated ~60, start prednisolone - Gastroenterology consulted, discussed with Dr. Oletta Lamas appreciate input.  - acute hepatitis panel negative, Tylenol level negative, sedimentation rate negative - less likely sepsis, continue antibiotics for 48h and d/c tomorrow if cultures remain negative, anti-smooth muscle, middle quadrant antibiotics, ANA ordered per GI are still pending - possible that his SIRS criteria on admission were related to dehydration, no apparent infectious source for now, consider discontinue antibiotics tomorrow after about 48 hours if cultures remain negative  Alcohol abuse - Patient appears to be minimizing alcohol intake, he is not sure when his last alcoholic drink was, "maybe a week ago", denies any seizures with alcohol cessation. Later on admission he told me that his last drink may have been 4 days ago. - Monitor on CIWA - Alcohol level negative in the emergency room  Acute  kidney injury - Creatinine on admission 2.24, received IV fluids >> 1.02 >> 0.49 - stop fluids  Hypokalemia/hypomagnesemia/hypophosphatemia - Possibly in the setting of alcohol abuse, poor nutrition, and GI losses given vomiting on admission  Thrombocytopenia - Likely in the setting of underlying liver disease/ongoing alcohol abuse - No bleeding is apparent - Platelets continue to decrease today  Suspect underlying cirrhosis - With thrombocytopenia, elevated bilirubin, elevated INR  Anemia with macrocytosis - Likely due to alcohol abuse, obtain vitamin B12 and folate levels   DVT prophylaxis: SCDs Code Status: Full code Family Communication: No family at bedside Disposition Plan: Remaining step down today given increase in LFTs   Consultants:   Gastroenterology  Procedures:   None   Antimicrobials:  Levaquin 7/30 >>  Metronidazole 7/30 >>   Subjective: - He is feeling much better this morning, no further nausea, appears to be tremulous Still   Objective: Vitals:   01/03/16 0400 01/03/16 0438 01/03/16 0600 01/03/16 0800  BP: (!) 90/43  (!) 110/56   Pulse: 79  97   Resp: (!) 21  20   Temp:  97.1 F (36.2 C)  98.5 F (36.9 C)  TempSrc:  Oral  Oral  SpO2: 96%  96%   Weight:      Height:        Intake/Output Summary (Last 24 hours) at 01/03/16 0916 Last data filed at 01/03/16 0600  Gross per 24 hour  Intake             1953 ml  Output              940 ml  Net  1013 ml   Filed Weights   01/01/16 1210 01/01/16 1600  Weight: 68 kg (150 lb) 63.7 kg (140 lb 6.9 oz)    Examination: Constitutional: NAD, tremulous Vitals:   01/03/16 0400 01/03/16 0438 01/03/16 0600 01/03/16 0800  BP: (!) 90/43  (!) 110/56   Pulse: 79  97   Resp: (!) 21  20   Temp:  97.1 F (36.2 C)  98.5 F (36.9 C)  TempSrc:  Oral  Oral  SpO2: 96%  96%   Weight:      Height:       Eyes: PERRL, + scleral icterus ENMT: Mucous membranes are moist Respiratory: clear to  auscultation bilaterally, no wheezing, no crackles. Normal respiratory effort Cardiovascular: Regular rate and rhythm, no murmurs / rubs / gallops. No LE edema.  Abdomen: no tenderness. Bowel sounds positive.  Neurologic: non focal  Psychiatric: Normal judgment and insight. Alert and oriented x 3. Normal mood.    Data Reviewed: I have personally reviewed following labs and imaging studies  CBC:  Recent Labs Lab 01/01/16 1128 01/02/16 0330 01/03/16 0312  WBC 18.6* 8.9 4.9  HGB 16.2 12.9* 11.4*  HCT 46.2 35.9* 32.7*  MCV 123.2* 120.5* 120.7*  PLT 116* 99* 52*   Basic Metabolic Panel:  Recent Labs Lab 01/01/16 1128 01/01/16 1130 01/02/16 0330 01/03/16 0312  NA 136  --  136 136  K 4.7  --  3.5 3.1*  CL 92*  --  103 101  CO2 27  --  22 28  GLUCOSE 124*  --  79 97  BUN 26*  --  23* 16  CREATININE 2.24*  --  1.02 0.49*  CALCIUM 9.7  --  8.1* 7.9*  MG  --  1.8  --  1.3*  PHOS  --   --   --  1.9*   GFR: Estimated Creatinine Clearance: 73.7 mL/min (by C-G formula based on SCr of 0.8 mg/dL). Liver Function Tests:  Recent Labs Lab 01/01/16 1128 01/01/16 1638 01/02/16 0330 01/03/16 0312  AST 379*  --  575* 824*  ALT 115*  --  202* 363*  ALKPHOS 87  --  63 63  BILITOT 7.2* 7.7* 8.5* 11.2*  PROT 7.1  --  5.0* 4.7*  ALBUMIN 4.2  --  2.8* 2.5*    Recent Labs Lab 01/01/16 1128  LIPASE 68*   No results for input(s): AMMONIA in the last 168 hours. Coagulation Profile:  Recent Labs Lab 01/01/16 1857 01/03/16 0547  INR 2.03 2.03   Cardiac Enzymes: No results for input(s): CKTOTAL, CKMB, CKMBINDEX, TROPONINI in the last 168 hours. BNP (last 3 results) No results for input(s): PROBNP in the last 8760 hours. HbA1C: No results for input(s): HGBA1C in the last 72 hours. CBG: No results for input(s): GLUCAP in the last 168 hours. Lipid Profile: No results for input(s): CHOL, HDL, LDLCALC, TRIG, CHOLHDL, LDLDIRECT in the last 72 hours. Thyroid Function Tests: No  results for input(s): TSH, T4TOTAL, FREET4, T3FREE, THYROIDAB in the last 72 hours. Anemia Panel: No results for input(s): VITAMINB12, FOLATE, FERRITIN, TIBC, IRON, RETICCTPCT in the last 72 hours. Urine analysis:    Component Value Date/Time   COLORURINE AMBER (A) 05/18/2013 0830   APPEARANCEUR CLEAR 05/18/2013 0830   LABSPEC >1.046 (H) 05/18/2013 0830   PHURINE 5.5 05/18/2013 0830   GLUCOSEU NEGATIVE 05/18/2013 0830   HGBUR NEGATIVE 05/18/2013 0830   BILIRUBINUR NEGATIVE 05/18/2013 0830   KETONESUR NEGATIVE 05/18/2013 0830   PROTEINUR NEGATIVE 05/18/2013 0830  UROBILINOGEN 1.0 05/18/2013 0830   NITRITE NEGATIVE 05/18/2013 0830   LEUKOCYTESUR NEGATIVE 05/18/2013 0830   Sepsis Labs: Invalid input(s): PROCALCITONIN, LACTICIDVEN  Recent Results (from the past 240 hour(s))  Blood Culture (routine x 2)     Status: None (Preliminary result)   Collection Time: 01/01/16 11:44 AM  Result Value Ref Range Status   Specimen Description BLOOD LEFT ARM  Final   Special Requests BOTTLES DRAWN AEROBIC AND ANAEROBIC 10 CC EACH  Final   Culture   Final    NO GROWTH < 24 HOURS Performed at [facility]    Report Status PENDING  Incomplete  Blood Culture (routine x 2)     Status: None (Preliminary result)   Collection Time: 01/01/16 11:53 AM  Result Value Ref Range Status   Specimen Description BLOOD RIGHT ANTECUBITAL  Final   Special Requests BOTTLES DRAWN AEROBIC AND ANAEROBIC 5 CC  Final   Culture   Final    NO GROWTH 1 DAY Performed at [facility]    Report Status PENDING  Incomplete  MRSA PCR Screening     Status: None   Collection Time: 01/01/16  4:09 PM  Result Value Ref Range Status   MRSA by PCR NEGATIVE NEGATIVE Final    Comment:        The GeneXpert MRSA Assay (FDA approved for NASAL specimens only), is one component of a comprehensive MRSA colonization surveillance program. It is not intended to diagnose MRSA infection nor to guide or monitor treatment  for MRSA infections.       Radiology Studies: Ct Abdomen Pelvis Wo Contrast  Result Date: 01/01/2016 CLINICAL DATA:  Abdominal pain and vomiting. EXAM: CT ABDOMEN AND PELVIS WITHOUT CONTRAST TECHNIQUE: Multidetector CT imaging of the abdomen and pelvis was performed following the standard protocol without IV contrast. COMPARISON:  None. FINDINGS: Lower chest: Diffuse peripheral interstitial coarsening bronchiectasis is noted at the lung bases bilaterally. No focal airspace consolidation is present. The heart is mildly enlarged. Coronary artery calcifications are present. Hepatobiliary: Extensive fatty infiltration of the liver is noted. No discrete mass lesion is present. The liver contours are smooth. The common bile duct is within normal limits. Gallbladder is unremarkable. Pancreas: No significant inflammatory changes are present. No focal cystic or solid mass lesion is present. There is no ductal dilation. Spleen: Normal size in appearance Adrenals/Urinary Tract: The adrenal glands are normal bilaterally. The kidneys and ureters are unremarkable. No mass lesion or stone is present. There is mild perinephric stranding bilaterally. The urinary bladder is within normal limits. Stomach/Bowel: The stomach is mildly dilated with a fluid level. No obstructing lesion is present. The duodenum is within normal limits. The small bowel is unremarkable. Contrast extends to the lower mid abdomen without a discrete transition point. The more distal bowel and terminal ileum are within normal limits. The appendix is visualized and normal. The cecum is within normal limits. Diverticular changes are present within the ascending colon without focal inflammation. Diverticula are also present in the descending and sigmoid colon without focal inflammation. Vascular/Lymphatic: Atherosclerotic calcifications are present within the aorta and branch vessels without aneurysm. No significant adenopathy is present. Reproductive:  Unremarkable Other: No free fluid or free air is present. Musculoskeletal: Grade 1 degenerative anterolisthesis is present at L4-5. There slight retrolisthesis at L2-3. No focal lytic or blastic lesions are present. Vertebral body heights are maintained. Advanced facet hypertrophy is present at L4-5 and L5-S1. The bony pelvis is intact. IMPRESSION: 1. Mild dilation of  the stomach without an obstructing lesion. This is nonspecific and may be within normal limits. 2. Colonic diverticular changes within the ascending colon, descending colon and proximal sigmoid colon without focal inflammation to suggest diverticulitis. 3. Diffuse hepatic steatosis. No discrete lesions or evidence for cirrhosis are present. 4. Grade 1 degenerative anterolisthesis at L4-5 secondary to advanced facet hypertrophy. Electronically Signed   By: San Morelle M.D.   On: 01/01/2016 12:50  [place] Art/ven Flow Abd Pelv Doppler  Result Date: 01/02/2016 CLINICAL DATA:  Elevated LFTs. Fatty liver suggested on earlier ultrasound. EXAM: DUPLEX ULTRASOUND OF LIVER TECHNIQUE: Color and duplex Doppler ultrasound was performed to evaluate the hepatic in-flow and out-flow vessels. COMPARISON:  CT from previous day FINDINGS: Portal Vein: No occlusion or thrombus. Hepatopetal flow. Velocities Main:  20-29 cm/sec Right:  28 cm/sec Left:  23 cm/sec Hepatic Vein Velocities Right:  Hepatofugal, 19 cm/sec Middle:  Hepatofugal, 16 cm/sec Left:  Not visualized secondary to overlying structures. Hepatic Artery Velocity:  58 cm/sec Spleen 5.6 x 7.3 x 3.6 cm (volume = 76.5 cc). Splenic Vein, no occlusion or thrombus. Velocity: 25 cm/sec Varices: None identified Ascites: Absent IMPRESSION: 1. Unremarkable hepatic Doppler evaluation. Left hepatic vein was obscured by overlying structures. Electronically Signed   By: Lucrezia Europe M.D.   On: 01/02/2016 10:09  Dg Chest Port 1 View  Result Date: 01/01/2016 CLINICAL DATA:  Sepsis EXAM: PORTABLE CHEST 1 VIEW  COMPARISON:  CT 08/03/2015 FINDINGS: Heart is normal size. Left lower lobe atelectasis or infiltrate. No visible effusion. Right lung is clear. No acute bony abnormality. IMPRESSION: Left lower lobe atelectasis or infiltrate. Electronically Signed   By: Rolm Baptise M.D.   On: 01/01/2016 12:30  US Abdomen Limited Ruq  Result Date: 01/02/2016 CLINICAL DATA:  70 year old male with hyperbilirubinemia. Subsequent encounter. EXAM: US ABDOMEN LIMITED - RIGHT UPPER QUADRANT COMPARISON:  01/01/2016 CT. FINDINGS: Gallbladder: No gallstones. Gallbladder wall thickness normal 2 top-normal. No pericholecystic fluid. No sonographic Murphy sign noted by sonographer. Common bile duct: Diameter: 2.3 mm. Liver: Diffuse increased echogenicity consistent with fatty infiltration without focal mass identified. Left lobe of the liver is poorly delineated secondary to overlying bowel gas. IMPRESSION: No gallstones. Gallbladder wall thickness normal to top normal. No pericholecystic fluid. No tenderness over the gallbladder during scanning. Diffuse fatty infiltration of the liver. Evaluation of the left lobe of the liver is limited by bowel gas. Proximal common bile duct is not dilated. Mid to distal aspect not visualized secondary to bowel gas. Electronically Signed   By: Genia Del M.D.   On: 01/02/2016 07:29    Scheduled Meds: . levofloxacin (LEVAQUIN) IV  750 mg Intravenous Q24H  . magnesium sulfate 1 - 4 g bolus IVPB  2 g Intravenous Once  . metronidazole  500 mg Intravenous Q8H  . ondansetron (ZOFRAN) IV  4 mg Intravenous Q6H  . pantoprazole  40 mg Oral BID  . potassium & sodium phosphates  1 packet Oral TID WC & HS  . prednisoLONE  20 mg Oral BID  . sodium chloride flush  3 mL Intravenous Q12H   Continuous Infusions: . sodium chloride 75 mL/hr at 01/03/16 0554    Marzetta Board, MD, PhD [facility] Pager [phone]  If 7PM-7AM, please contact night-coverage [email or web address] Password  [email or web address] 01/03/2016, 9:16 AM

## 2016-01-03 NOTE — Progress Notes (Signed)
[facility] PROGRESS NOTE Subjective patient feels well without any specific complaints. Labs show progression of his hepatic inflammation with bilirubin up to 11 and transaminases increased. Fortunately, acute hepatitis profile negative for hepatitis A, B, and C. Other studies such as autoimmune hepatitis alpha-1 antitrypsin PBC etc. are still pending  Objective: Vital signs in last 24 hours: Temp:  [97.1 F (36.2 C)-99.1 F (37.3 C)] 97.1 F (36.2 C) (08/01 0438) Pulse Rate:  [79-102] 97 (08/01 0600) Resp:  [20-22] 20 (08/01 0600) BP: (90-113)/(43-90) 110/56 (08/01 0600) SpO2:  [96 %-98 %] 96 % (08/01 0600) Last BM Date: 01/02/16  Intake/Output from previous day: 07/31 0701 - 08/01 0700 In: 2328 [I.V.:1878; IV Piggyback:450] Out: [id number] [Urine:1190] Intake/Output this shift: No intake/output data recorded.  PE: General-- slightly jaundiced no asterixis. Sitting in bed reading a book   Lab Results:  Recent Labs  01/01/16 1128 01/02/16 0330 01/03/16 0312  WBC 18.6* 8.9 4.9  HGB 16.2 12.9* 11.4*  HCT 46.2 35.9* 32.7*  PLT 116* 99* 52*   BMET  Recent Labs  01/01/16 1128 01/02/16 0330 01/03/16 0312  NA 136 136 136  K 4.7 3.5 3.1*  CL 92* 103 101  CO2 27 22 28   CREATININE 2.24* 1.02 0.49*   LFT  Recent Labs  01/01/16 1128 01/01/16 1638 01/02/16 0330 01/03/16 0312  PROT 7.1  --  5.0* 4.7*  AST 379*  --  575* 824*  ALT 115*  --  202* 363*  ALKPHOS 87  --  63 63  BILITOT 7.2* 7.7* 8.5* 11.2*  BILIDIR  --  4.6*  --   --   IBILI  --  3.1*  --   --    PT/INR  Recent Labs  01/01/16 1857 01/03/16 0547  LABPROT 23.3* 23.3*  INR 2.03 2.03   PANCREAS  Recent Labs  01/01/16 1128  LIPASE 68*         Studies/Results: Ct Abdomen Pelvis Wo Contrast  Result Date: 01/01/2016 CLINICAL DATA:  Abdominal pain and vomiting. EXAM: CT ABDOMEN AND PELVIS WITHOUT CONTRAST TECHNIQUE: Multidetector CT imaging of the abdomen and pelvis was performed  following the standard protocol without IV contrast. COMPARISON:  None. FINDINGS: Lower chest: Diffuse peripheral interstitial coarsening bronchiectasis is noted at the lung bases bilaterally. No focal airspace consolidation is present. The heart is mildly enlarged. Coronary artery calcifications are present. Hepatobiliary: Extensive fatty infiltration of the liver is noted. No discrete mass lesion is present. The liver contours are smooth. The common bile duct is within normal limits. Gallbladder is unremarkable. Pancreas: No significant inflammatory changes are present. No focal cystic or solid mass lesion is present. There is no ductal dilation. Spleen: Normal size in appearance Adrenals/Urinary Tract: The adrenal glands are normal bilaterally. The kidneys and ureters are unremarkable. No mass lesion or stone is present. There is mild perinephric stranding bilaterally. The urinary bladder is within normal limits. Stomach/Bowel: The stomach is mildly dilated with a fluid level. No obstructing lesion is present. The duodenum is within normal limits. The small bowel is unremarkable. Contrast extends to the lower mid abdomen without a discrete transition point. The more distal bowel and terminal ileum are within normal limits. The appendix is visualized and normal. The cecum is within normal limits. Diverticular changes are present within the ascending colon without focal inflammation. Diverticula are also present in the descending and sigmoid colon without focal inflammation. Vascular/Lymphatic: Atherosclerotic calcifications are present within the aorta and branch vessels without aneurysm. No significant adenopathy  is present. Reproductive: Unremarkable Other: No free fluid or free air is present. Musculoskeletal: Grade 1 degenerative anterolisthesis is present at L4-5. There slight retrolisthesis at L2-3. No focal lytic or blastic lesions are present. Vertebral body heights are maintained. Advanced facet  hypertrophy is present at L4-5 and L5-S1. The bony pelvis is intact. IMPRESSION: 1. Mild dilation of the stomach without an obstructing lesion. This is nonspecific and may be within normal limits. 2. Colonic diverticular changes within the ascending colon, descending colon and proximal sigmoid colon without focal inflammation to suggest diverticulitis. 3. Diffuse hepatic steatosis. No discrete lesions or evidence for cirrhosis are present. 4. Grade 1 degenerative anterolisthesis at L4-5 secondary to advanced facet hypertrophy. Electronically Signed   By: San Morelle M.D.   On: 01/01/2016 12:50  [place] Art/ven Flow Abd Pelv Doppler  Result Date: 01/02/2016 CLINICAL DATA:  Elevated LFTs. Fatty liver suggested on earlier ultrasound. EXAM: DUPLEX ULTRASOUND OF LIVER TECHNIQUE: Color and duplex Doppler ultrasound was performed to evaluate the hepatic in-flow and out-flow vessels. COMPARISON:  CT from previous day FINDINGS: Portal Vein: No occlusion or thrombus. Hepatopetal flow. Velocities Main:  20-29 cm/sec Right:  28 cm/sec Left:  23 cm/sec Hepatic Vein Velocities Right:  Hepatofugal, 19 cm/sec Middle:  Hepatofugal, 16 cm/sec Left:  Not visualized secondary to overlying structures. Hepatic Artery Velocity:  58 cm/sec Spleen 5.6 x 7.3 x 3.6 cm (volume = 76.5 cc). Splenic Vein, no occlusion or thrombus. Velocity: 25 cm/sec Varices: None identified Ascites: Absent IMPRESSION: 1. Unremarkable hepatic Doppler evaluation. Left hepatic vein was obscured by overlying structures. Electronically Signed   By: Lucrezia Europe M.D.   On: 01/02/2016 10:09  Dg Chest Port 1 View  Result Date: 01/01/2016 CLINICAL DATA:  Sepsis EXAM: PORTABLE CHEST 1 VIEW COMPARISON:  CT 08/03/2015 FINDINGS: Heart is normal size. Left lower lobe atelectasis or infiltrate. No visible effusion. Right lung is clear. No acute bony abnormality. IMPRESSION: Left lower lobe atelectasis or infiltrate. Electronically Signed   By: Rolm Baptise M.D.   On:  01/01/2016 12:30  US Abdomen Limited Ruq  Result Date: 01/02/2016 CLINICAL DATA:  70 year old male with hyperbilirubinemia. Subsequent encounter. EXAM: US ABDOMEN LIMITED - RIGHT UPPER QUADRANT COMPARISON:  01/01/2016 CT. FINDINGS: Gallbladder: No gallstones. Gallbladder wall thickness normal 2 top-normal. No pericholecystic fluid. No sonographic Murphy sign noted by sonographer. Common bile duct: Diameter: 2.3 mm. Liver: Diffuse increased echogenicity consistent with fatty infiltration without focal mass identified. Left lobe of the liver is poorly delineated secondary to overlying bowel gas. IMPRESSION: No gallstones. Gallbladder wall thickness normal to top normal. No pericholecystic fluid. No tenderness over the gallbladder during scanning. Diffuse fatty infiltration of the liver. Evaluation of the left lobe of the liver is limited by bowel gas. Proximal common bile duct is not dilated. Mid to distal aspect not visualized secondary to bowel gas. Electronically Signed   By: Genia Del M.D.   On: 01/02/2016 07:29   Medications: I have reviewed the patient's current medications.  Assessment/Plan: 1. Acute hepatitis. Probably alcoholic hepatitis. Labs negative for viral hepatitis. His Maddrey discriminat function is 60. Any number over 30 benefits from a one-month course of prednisolone and I will go ahead and start that day. I would like to follow him in my office after discharge about 1 to 2 weeks. I have had a long discussion with the patient about the benefits of steroids in acute alcoholic hepatitis. He understands that if his insurance will not pay for prednisolone which is the  preferred steroid, we can change this over to prednisone. I have given him my card and he will call should this occur. I feel that he could be discharged home after his other issues have been adequately addressed.   Aniston Christman JR,Camp Gopal L 01/03/2016, 7:32 AM  This note was created using voice recognition software. Minor  errors may Have occurred unintentionally.  Pager: [phone] If no answer or after hours call [phone]

## 2016-01-04 LAB — COMPREHENSIVE METABOLIC PANEL
ALK PHOS: 84 U/L (ref 38–126)
ALT: 309 U/L — ABNORMAL HIGH (ref 17–63)
ANION GAP: 7 (ref 5–15)
AST: 546 U/L — ABNORMAL HIGH (ref 15–41)
Albumin: 2.6 g/dL — ABNORMAL LOW (ref 3.5–5.0)
BUN: 12 mg/dL (ref 6–20)
CALCIUM: 7.9 mg/dL — AB (ref 8.9–10.3)
CO2: 28 mmol/L (ref 22–32)
Chloride: 99 mmol/L — ABNORMAL LOW (ref 101–111)
Creatinine, Ser: 0.3 mg/dL — ABNORMAL LOW (ref 0.61–1.24)
GFR calc non Af Amer: >60 mL/min (ref >60)
Glucose, Bld: 223 mg/dL — ABNORMAL HIGH (ref 65–99)
POTASSIUM: 3.6 mmol/L (ref 3.5–5.1)
SODIUM: 134 mmol/L — AB (ref 135–145)
Total Bilirubin: 10 mg/dL — ABNORMAL HIGH (ref 0.3–1.2)
Total Protein: 4.7 g/dL — ABNORMAL LOW (ref 6.5–8.1)

## 2016-01-04 LAB — CBC
HCT: 30.9 % — ABNORMAL LOW (ref 39.0–52.0)
HEMOGLOBIN: 11.2 g/dL — AB (ref 13.0–17.0)
MCH: 42.7 pg — AB (ref 26.0–34.0)
MCHC: 36.2 g/dL — ABNORMAL HIGH (ref 30.0–36.0)
MCV: 117.9 fL — ABNORMAL HIGH (ref 78.0–100.0)
Platelets: 66 10*3/uL — ABNORMAL LOW (ref 150–400)
RBC: 2.62 MIL/uL — AB (ref 4.22–5.81)
RDW: 15.9 % — ABNORMAL HIGH (ref 11.5–15.5)
WBC: 3.8 10*3/uL — ABNORMAL LOW (ref 4.0–10.5)

## 2016-01-04 LAB — FOLATE RBC
FOLATE, HEMOLYSATE: 212.2 ng/mL
FOLATE, RBC: 643 ng/mL (ref >498)
HEMATOCRIT: 33 % — AB (ref 37.5–51.0)

## 2016-01-04 LAB — ANTINUCLEAR ANTIBODIES, IFA: ANTINUCLEAR ANTIBODIES, IFA: NEGATIVE

## 2016-01-04 MED ORDER — K PHOS MONO-SOD PHOS DI & MONO 155-852-130 MG PO TABS
250.0000 mg | ORAL_TABLET | Freq: Two times a day (BID) | ORAL | Status: AC
Start: 2016-01-04 — End: 2016-01-05
  Administered 2016-01-04 – 2016-01-05 (×4): 250 mg via ORAL
  Filled 2016-01-04 (×4): qty 1

## 2016-01-04 MED ORDER — MAGNESIUM SULFATE 2 GM/50ML IV SOLN
2.0000 g | Freq: Once | INTRAVENOUS | Status: AC
  Administered 2016-01-04: 2 g via INTRAVENOUS
  Filled 2016-01-04: qty 50

## 2016-01-04 MED ORDER — MAGNESIUM OXIDE 400 (241.3 MG) MG PO TABS
200.0000 mg | ORAL_TABLET | Freq: Two times a day (BID) | ORAL | Status: DC
  Administered 2016-01-04 – 2016-01-06 (×5): 200 mg via ORAL
  Filled 2016-01-04 (×4): qty 0.5
  Filled 2016-01-04: qty 1

## 2016-01-04 NOTE — Progress Notes (Addendum)
QR:[id number] Davis,BSN,RN3,CCM; CHART REVIEWED [place]./lft's remain elevated/etoh w/d protocol on going.   No discharge planning needs at this time. Lives at home with family.

## 2016-01-04 NOTE — Progress Notes (Signed)
Pt received from ICU via wheelchair.  Pt's VSS

## 2016-01-04 NOTE — Progress Notes (Signed)
PROGRESS NOTE  Sean Jackson [id number] DOB: 1945-07-13 DOA: 01/01/2016  PCP: Aretta Nip, MD   LOS: 3 days   Brief Narrative: 70 year old with ETOH abuse, presented to [place] long EGD on 7/13 with persistent nausea and vomiting in the last couple days. Denies any blood in his emesis. He was admitted to step down with elevated LFTs and gastroenterology was consulted.  Assessment & Plan:  Acute alcoholic hepatitis - With elevated LFTs in a pattern consistent with alcohol abuse, elevated bilirubin. LFTs look worse today as well as bilirubin. - CT abdominal pelvis unremarkable - Right upper quadrant ultrasound without gallbladder pathology, without any CBD dilation.  - Doppler signal without any thrombus - acute hepatitis panel negative, Tylenol level negative, sedimentation rate negative - possible that his SIRS criteria on admission were related to dehydration, no apparent infectious source for now, d/c ABX   Alcohol abuse - Patient appears to be minimizing alcohol intake, he is not sure when his last alcoholic drink was - Monitor on CIWA - Alcohol level negative in the emergency room  Acute kidney injury - Creatinine on admission 2.24, received IV fluids >> 1.02 >> 0.49, remain WNL - IVF d/c as pt tolerating PO intake   Hypokalemia/hypomagnesemia/hypophosphatemia - Possibly in the setting of alcohol abuse, poor nutrition, and GI losses given vomiting  - continue to supplement as Mg and PO4 still low   Thrombocytopenia - Likely in the setting of underlying liver disease/ongoing alcohol abuse - No bleeding is apparent - improving overall   Suspect underlying cirrhosis - With thrombocytopenia, elevated bilirubin, elevated INR  Anemia with macrocytosis - Likely due to alcohol abuse, obtain vitamin B12 and folate levels  DVT prophylaxis: SCDs Code Status: Full code Family Communication: No family at bedside Disposition Plan: Transfer to medical floor    Consultants:   Gastroenterology  Procedures:   None   Antimicrobials:  Levaquin 7/30 >>  Metronidazole 7/30 >>   Subjective: No concerns this AM, reports eating better and no N/V, mild diarrhea still present.  Objective: Vitals:   01/03/16 2210 01/04/16 0000 01/04/16 0400 01/04/16 0800  BP:  110/60 117/74   Pulse:  87 81 84  Resp:  17 19   Temp:  98.6 F (37 C) 99 F (37.2 C) 97.9 F (36.6 C)  TempSrc:  Oral Oral Oral  SpO2: 96% 92% 96% 99%  Weight:      Height:        Intake/Output Summary (Last 24 hours) at 01/04/16 1127 Last data filed at 01/04/16 0517  Gross per 24 hour  Intake              200 ml  Output             1442 ml  Net            -1242 ml   Filed Weights   01/01/16 1210 01/01/16 1600  Weight: 68 kg (150 lb) 63.7 kg (140 lb 6.9 oz)    Examination: Eyes: PERRL, + scleral icterus ENMT: Mucous membranes are moist Respiratory: Normal respiratory effort Cardiovascular: Regular rate and rhythm, no murmurs / rubs / gallops. No LE edema.  Abdomen: no tenderness. Bowel sounds positive.  Neurologic: non focal   Data Reviewed: I have personally reviewed following labs and imaging studies  CBC:  Recent Labs Lab 01/01/16 1128 01/02/16 0330 01/03/16 0312 01/04/16 0315  WBC 18.6* 8.9 4.9 3.8*  HGB 16.2 12.9* 11.4* 11.2*  HCT 46.2 35.9* 32.7* 30.9*  MCV 123.2* 120.5* 120.7* 117.9*  PLT 116* 99* 52* 66*   Basic Metabolic Panel:  Recent Labs Lab 01/01/16 1128 01/01/16 1130 01/02/16 0330 01/03/16 0312 01/04/16 0315  NA 136  --  136 136 134*  K 4.7  --  3.5 3.1* 3.6  CL 92*  --  103 101 99*  CO2 27  --  22 28 28   GLUCOSE 124*  --  79 97 223*  BUN 26*  --  23* 16 12  CREATININE 2.24*  --  1.02 0.49* 0.30*  CALCIUM 9.7  --  8.1* 7.9* 7.9*  MG  --  1.8  --  1.3*  --   PHOS  --   --   --  1.9*  --    Liver Function Tests:  Recent Labs Lab 01/01/16 1128 01/01/16 1638 01/02/16 0330 01/03/16 0312 01/04/16 0315  AST 379*  --   575* 824* 546*  ALT 115*  --  202* 363* 309*  ALKPHOS 87  --  63 63 84  BILITOT 7.2* 7.7* 8.5* 11.2* 10.0*  PROT 7.1  --  5.0* 4.7* 4.7*  ALBUMIN 4.2  --  2.8* 2.5* 2.6*    Recent Labs Lab 01/01/16 1128  LIPASE 68*   Coagulation Profile:  Recent Labs Lab 01/01/16 1857 01/03/16 0547  INR 2.03 2.03    Recent Labs  01/03/16 0948  VITAMINB12 1,052*   Urine analysis:    Component Value Date/Time   COLORURINE AMBER (A) 05/18/2013 0830   APPEARANCEUR CLEAR 05/18/2013 0830   LABSPEC >1.046 (H) 05/18/2013 0830   PHURINE 5.5 05/18/2013 0830   GLUCOSEU NEGATIVE 05/18/2013 0830   HGBUR NEGATIVE 05/18/2013 0830   BILIRUBINUR NEGATIVE 05/18/2013 0830   KETONESUR NEGATIVE 05/18/2013 0830   PROTEINUR NEGATIVE 05/18/2013 0830   UROBILINOGEN 1.0 05/18/2013 0830   NITRITE NEGATIVE 05/18/2013 0830   LEUKOCYTESUR NEGATIVE 05/18/2013 0830    Recent Results (from the past 240 hour(s))  Blood Culture (routine x 2)     Status: None (Preliminary result)   Collection Time: 01/01/16 11:44 AM  Result Value Ref Range Status   Specimen Description BLOOD LEFT ARM  Final   Special Requests BOTTLES DRAWN AEROBIC AND ANAEROBIC 10 CC EACH  Final   Culture   Final    NO GROWTH 2 DAYS Performed at [facility]    Report Status PENDING  Incomplete  Blood Culture (routine x 2)     Status: None (Preliminary result)   Collection Time: 01/01/16 11:53 AM  Result Value Ref Range Status   Specimen Description BLOOD RIGHT ANTECUBITAL  Final   Special Requests BOTTLES DRAWN AEROBIC AND ANAEROBIC 5 CC  Final   Culture   Final    NO GROWTH 2 DAYS Performed at [facility]    Report Status PENDING  Incomplete  MRSA PCR Screening     Status: None   Collection Time: 01/01/16  4:09 PM  Result Value Ref Range Status   MRSA by PCR NEGATIVE NEGATIVE Final      Radiology Studies: No results found.  Scheduled Meds: . levofloxacin (LEVAQUIN) IV  750 mg Intravenous Q24H  . metronidazole   500 mg Intravenous Q8H  . ondansetron (ZOFRAN) IV  4 mg Intravenous Q6H  . pantoprazole  40 mg Oral BID  . prednisoLONE  20 mg Oral BID  . sodium chloride flush  3 mL Intravenous Q12H   Continuous Infusions:   Faye Ramsay, MD  [facility] Pager [phone]  If  7PM-7AM, please contact night-coverage [email or web address] Password [email or web address]   If 7PM-7AM, please contact night-coverage [email or web address] Password [facility] 01/04/2016, 11:27 AM

## 2016-01-04 NOTE — Progress Notes (Signed)
[facility] PROGRESS NOTE Subjective patient without complaints. Prednisolone started yesterday  Objective: Vital signs in last 24 hours: Temp:  [98.2 F (36.8 C)-99 F (37.2 C)] 99 F (37.2 C) (08/02 0400) Pulse Rate:  [81-87] 81 (08/02 0400) Resp:  [17-19] 19 (08/02 0400) BP: (110-117)/(60-74) 117/74 (08/02 0400) SpO2:  [86 %-96 %] 96 % (08/02 0400) Last BM Date: 01/02/16  Intake/Output from previous day: 08/01 0701 - 08/02 0700 In: 203 [I.V.:3; IV Piggyback:200] Out: [id number] [Urine:1440; Stool:2] Intake/Output this shift: No intake/output data recorded.    Lab Results:  Recent Labs  01/01/16 1128 01/02/16 0330 01/03/16 0312 01/04/16 0315  WBC 18.6* 8.9 4.9 3.8*  HGB 16.2 12.9* 11.4* 11.2*  HCT 46.2 35.9* 32.7* 30.9*  PLT 116* 99* 52* 66*   BMET  Recent Labs  01/01/16 1128 01/02/16 0330 01/03/16 0312 01/04/16 0315  NA 136 136 136 134*  K 4.7 3.5 3.1* 3.6  CL 92* 103 101 99*  CO2 27 22 28 28   CREATININE 2.24* 1.02 0.49* 0.30*   LFT  Recent Labs  01/01/16 1638 01/02/16 0330 01/03/16 0312 01/04/16 0315  PROT  --  5.0* 4.7* 4.7*  AST  --  575* 824* 546*  ALT  --  202* 363* 309*  ALKPHOS  --  63 63 84  BILITOT 7.7* 8.5* 11.2* 10.0*  BILIDIR 4.6*  --   --   --   IBILI 3.1*  --   --   --    PT/INR  Recent Labs  01/01/16 1857 01/03/16 0547  LABPROT 23.3* 23.3*  INR 2.03 2.03   PANCREAS  Recent Labs  01/01/16 1128  LIPASE 68*         Studies/Results: [place] Art/ven Flow Abd Pelv Doppler  Result Date: 01/02/2016 CLINICAL DATA:  Elevated LFTs. Fatty liver suggested on earlier ultrasound. EXAM: DUPLEX ULTRASOUND OF LIVER TECHNIQUE: Color and duplex Doppler ultrasound was performed to evaluate the hepatic in-flow and out-flow vessels. COMPARISON:  CT from previous day FINDINGS: Portal Vein: No occlusion or thrombus. Hepatopetal flow. Velocities Main:  20-29 cm/sec Right:  28 cm/sec Left:  23 cm/sec Hepatic Vein Velocities Right:   Hepatofugal, 19 cm/sec Middle:  Hepatofugal, 16 cm/sec Left:  Not visualized secondary to overlying structures. Hepatic Artery Velocity:  58 cm/sec Spleen 5.6 x 7.3 x 3.6 cm (volume = 76.5 cc). Splenic Vein, no occlusion or thrombus. Velocity: 25 cm/sec Varices: None identified Ascites: Absent IMPRESSION: 1. Unremarkable hepatic Doppler evaluation. Left hepatic vein was obscured by overlying structures. Electronically Signed   By: Lucrezia Europe M.D.   On: 01/02/2016 10:09   Medications: I have reviewed the patient's current medications.  Assessment/Plan: 1. Alcoholic hepatitis. Prednisolone started due to elevated Maddrey score. He will need to remain on prednisolone for a month. Is okay to discharge my viewpoint and I would like to see him back in the office in 2 to 3 weeks and will monitor his liver test and initiate a tapering situation for the prednisolone. He will call me if he cannot obtain prednisolone through the insurance company will need to change over to prednisone due to expense.   Tamya Denardo JR,Ritu Gagliardo L 01/04/2016, 7:40 AM  This note was created using voice recognition software. Minor errors may Have occurred unintentionally.  Pager: [phone] If no answer or after hours call [phone]

## 2016-01-05 LAB — COMPREHENSIVE METABOLIC PANEL
ALT: 234 U/L — ABNORMAL HIGH (ref 17–63)
ANION GAP: 8 (ref 5–15)
AST: 274 U/L — ABNORMAL HIGH (ref 15–41)
Albumin: 2.8 g/dL — ABNORMAL LOW (ref 3.5–5.0)
Alkaline Phosphatase: 120 U/L (ref 38–126)
BILIRUBIN TOTAL: 8.3 mg/dL — AB (ref 0.3–1.2)
BUN: 18 mg/dL (ref 6–20)
CO2: 31 mmol/L (ref 22–32)
Calcium: 8.4 mg/dL — ABNORMAL LOW (ref 8.9–10.3)
Chloride: 99 mmol/L — ABNORMAL LOW (ref 101–111)
Creatinine, Ser: 0.42 mg/dL — ABNORMAL LOW (ref 0.61–1.24)
Glucose, Bld: 183 mg/dL — ABNORMAL HIGH (ref 65–99)
POTASSIUM: 3.5 mmol/L (ref 3.5–5.1)
Sodium: 138 mmol/L (ref 135–145)
TOTAL PROTEIN: 5.3 g/dL — AB (ref 6.5–8.1)

## 2016-01-05 LAB — PROTEIN ELECTROPHORESIS, SERUM
A/G RATIO SPE: 1.5 (ref 0.7–1.7)
ALBUMIN ELP: 2.8 g/dL — AB (ref 2.9–4.4)
ALPHA-1-GLOBULIN: 0.2 g/dL (ref 0.0–0.4)
ALPHA-2-GLOBULIN: 0.3 g/dL — AB (ref 0.4–1.0)
BETA GLOBULIN: 0.6 g/dL — AB (ref 0.7–1.3)
GAMMA GLOBULIN: 0.8 g/dL (ref 0.4–1.8)
Globulin, Total: 1.9 g/dL — ABNORMAL LOW (ref 2.2–3.9)
Total Protein ELP: 4.7 g/dL — ABNORMAL LOW (ref 6.0–8.5)

## 2016-01-05 LAB — CBC
HEMATOCRIT: 32.5 % — AB (ref 39.0–52.0)
HEMOGLOBIN: 12 g/dL — AB (ref 13.0–17.0)
MCH: 43.2 pg — ABNORMAL HIGH (ref 26.0–34.0)
MCHC: 36.9 g/dL — ABNORMAL HIGH (ref 30.0–36.0)
MCV: 116.9 fL — ABNORMAL HIGH (ref 78.0–100.0)
Platelets: 88 10*3/uL — ABNORMAL LOW (ref 150–400)
RBC: 2.78 MIL/uL — ABNORMAL LOW (ref 4.22–5.81)
RDW: 16 % — ABNORMAL HIGH (ref 11.5–15.5)
WBC: 6.8 10*3/uL (ref 4.0–10.5)

## 2016-01-05 MED ORDER — MAGNESIUM SULFATE 2 GM/50ML IV SOLN
2.0000 g | Freq: Once | INTRAVENOUS | Status: AC
  Administered 2016-01-05: 2 g via INTRAVENOUS
  Filled 2016-01-05: qty 50

## 2016-01-05 NOTE — Evaluation (Signed)
Physical Therapy Evaluation Patient Details Name: Sean Jackson MRN: [id number] DOB: 1945/07/04 Today's Date: 01/05/2016   History of Present Illness  70 yo male admitted with N/V, SIRS, ETOH hepatitis, Hx of ETOH abuse  Clinical Impression  On eval, pt required Min assist for mobility. He walked ~50 feet with RW. Pt is unsteady and at risk for falls. LOB x 2 during session. He fatigues fairly easily with activity. Discussed d/c plan-he politely declines SNF rehab. So, will recommend HHPT and RW use for ambulation.     Follow Up Recommendations Home health PT;Supervision/Assistance - 24 hour (pt declines SNF rehab)    Equipment Recommendations   (pt states he will borrow a walker)    Recommendations for Other Services       Precautions / Restrictions Precautions Precautions: Fall Restrictions Weight Bearing Restrictions: No      Mobility  Bed Mobility Overal bed mobility: Needs Assistance Bed Mobility: Supine to Sit;Sit to Supine     Supine to sit: Supervision Sit to supine: Supervision   General bed mobility comments: for safety  Transfers Overall transfer level: Needs assistance Equipment used: Rolling walker (2 wheeled) Transfers: Sit to/from Stand Sit to Stand: Min assist         General transfer comment: Assist to rise, stabilize, control descent. Multiple attempts to get to standing. LOB posteriorly x 2.   Ambulation/Gait Ambulation/Gait assistance: Min assist Ambulation Distance (Feet): 50 Feet Assistive device: Rolling walker (2 wheeled) Gait Pattern/deviations: Step-through pattern;Decreased stride length;Steppage;Ataxic     General Gait Details: Mildy ataxic gait pattern. Increased hip and knee flexion noted bilaterally as well. Pt is unsteady and at risk for falls. Fatigues fairly easily.   Stairs            Wheelchair Mobility    Modified Rankin (Stroke Patients Only)       Balance Overall balance assessment: Needs assistance          Standing balance support: Bilateral upper extremity supported Standing balance-Leahy Scale: Poor                               Pertinent Vitals/Pain Pain Assessment: No/denies pain    Home Living Family/patient expects to be discharged to:: Private residence Living Arrangements: Other relatives (brother) Available Help at Discharge: Family (very little physical assistance available) Type of Home: Apartment Home Access: Stairs to enter     Home Layout: One level Home Equipment: None Additional Comments: will borrow walker from brother (per pt)    Prior Function Level of Independence: Independent               Hand Dominance        Extremity/Trunk Assessment   Upper Extremity Assessment: Generalized weakness           Lower Extremity Assessment: Generalized weakness      Cervical / Trunk Assessment: Normal  Communication   Communication: No difficulties  Cognition Arousal/Alertness: Awake/alert Behavior During Therapy: WFL for tasks assessed/performed Overall Cognitive Status: Within Functional Limits for tasks assessed                      General Comments      Exercises        Assessment/Plan    PT Assessment Patient needs continued PT services  PT Diagnosis Difficulty walking;Generalized weakness   PT Problem List Decreased strength;Decreased activity tolerance;Decreased balance;Decreased mobility;Decreased knowledge of use of DME;Decreased  coordination  PT Treatment Interventions DME instruction;Gait training;Functional mobility training;Balance training;Therapeutic exercise;Therapeutic activities;Patient/family education   PT Goals (Current goals can be found in the Care Plan section) Acute Rehab PT Goals Patient Stated Goal: to regain independence PT Goal Formulation: With patient Time For Goal Achievement: 01/19/16 Potential to Achieve Goals: Good    Frequency Min 3X/week   Barriers to discharge         Co-evaluation               End of Session Equipment Utilized During Treatment: Gait belt Activity Tolerance: Patient limited by fatigue Patient left: in bed;with call bell/phone within reach;with bed alarm set           Time: 1447-1501 PT Time Calculation (min) (ACUTE ONLY): 14 min   Charges:   PT Evaluation $PT Eval Low Complexity: 1 Procedure     PT G Codes:        Weston Anna, MPT Pager: [phone]

## 2016-01-05 NOTE — Care Management Important Message (Signed)
Important Message  Patient Details  Name: VIRGINIO MCAULEY MRN: [id number] Date of Birth: Jul 12, 1945   Medicare Important Message Given:  Yes    Camillo Flaming 01/05/2016, 9:16 AMImportant Message  Patient Details  Name: YUCHEN ROVINSKY MRN: [id number] Date of Birth: 1946/02/04   Medicare Important Message Given:  Yes    Camillo Flaming 01/05/2016, 9:16 AM

## 2016-01-05 NOTE — Progress Notes (Signed)
PROGRESS NOTE  Sean Jackson [id number] DOB: Mar 26, 1946 DOA: 01/01/2016  PCP: Aretta Nip, MD   LOS: 4 days   Brief Narrative: 70 year old with ETOH abuse, presented to [place] long EGD on 7/13 with persistent nausea and vomiting in the last couple days. Denies any blood in his emesis. He was admitted to step down with elevated LFTs and gastroenterology was consulted.  Assessment & Plan:  Acute alcoholic hepatitis - Liver enzymes trending down, please see blood work analysis below - CT abdominal pelvis unremarkable - Right upper quadrant ultrasound without gallbladder pathology, without any CBD dilation.  - Doppler signal without any thrombus - acute hepatitis panel negative, Tylenol level negative, sedimentation rate negative - no apparent infectious source for now, antibiotics have been discontinued - Please note that patient is rather anxious about going home says he is too weak - PT evaluation requested, probable discharge home in the morning  Alcohol abuse - Patient appears to be minimizing alcohol intake, he is not sure when his last alcoholic drink was - Monitor on CIWA, so far no signs of withdrawal - Alcohol level negative in the emergency room  Acute kidney injury - Creatinine on admission 2.24, received IV fluids >> 1.02 >> 0.49, remain WNL  Hypokalemia/hypomagnesemia/hypophosphatemia - Possibly in the setting of alcohol abuse, poor nutrition, and GI losses given vomiting  - continue to supplement as Mg and PO4 still low   Thrombocytopenia - Likely in the setting of underlying liver disease/ongoing alcohol abuse - No bleeding is apparent - improving overall   Suspect underlying cirrhosis - With thrombocytopenia, elevated bilirubin, elevated INR - close outpatient follow up   Anemia with macrocytosis - Likely due to alcohol abuse, overall stable   DVT prophylaxis: SCDs Code Status: Full code Family Communication: No family at bedside Disposition  Plan: D/C in AM pending PT eval   Consultants:   Gastroenterology  PT   Procedures:   None   Antimicrobials:  Levaquin 7/30 >> 8/2  Metronidazole 7/30 >> 8/2   Subjective: No concerns this AM, reports eating better and no N/V, very anxious about going home, says he needs two people assistance and is too weak to go home.   Objective: Vitals:   01/04/16 1200 01/04/16 1713 01/04/16 2201 01/05/16 0626  BP:  112/79 110/66 112/74  Pulse:  88 88 88  Resp:  18 18 18   Temp: 98.5 F (36.9 C) 97.6 F (36.4 C) 97.4 F (36.3 C) 98.7 F (37.1 C)  TempSrc: Oral Axillary Oral Oral  SpO2:  98% 95% 97%  Weight:      Height:        Intake/Output Summary (Last 24 hours) at 01/05/16 1022 Last data filed at 01/04/16 2100  Gross per 24 hour  Intake              240 ml  Output              375 ml  Net             -135 ml   Filed Weights   01/01/16 1210 01/01/16 1600  Weight: 68 kg (150 lb) 63.7 kg (140 lb 6.9 oz)    Examination: Eyes: PERRL, + scleral icterus ENMT: Mucous membranes are moist Respiratory: Normal respiratory effort Cardiovascular: Regular rate and rhythm, no murmurs / rubs / gallops. No LE edema.  Abdomen: no tenderness. Bowel sounds positive.  Neurologic: non focal   Data Reviewed: I have personally reviewed following labs and imaging studies  CBC:  Recent Labs Lab 01/01/16 1128 01/02/16 0330 01/03/16 0312 01/03/16 0948 01/04/16 0315 01/05/16 0528  WBC 18.6* 8.9 4.9  --  3.8* 6.8  HGB 16.2 12.9* 11.4*  --  11.2* 12.0*  HCT 46.2 35.9* 32.7* 33.0* 30.9* 32.5*  MCV 123.2* 120.5* 120.7*  --  117.9* 116.9*  PLT 116* 99* 52*  --  66* 88*   Basic Metabolic Panel:  Recent Labs Lab 01/01/16 1128 01/01/16 1130 01/02/16 0330 01/03/16 0312 01/04/16 0315 01/05/16 0528  NA 136  --  136 136 134* 138  K 4.7  --  3.5 3.1* 3.6 3.5  CL 92*  --  103 101 99* 99*  CO2 27  --  22 28 28 31   GLUCOSE 124*  --  79 97 223* 183*  BUN 26*  --  23* 16 12 18     CREATININE 2.24*  --  1.02 0.49* 0.30* 0.42*  CALCIUM 9.7  --  8.1* 7.9* 7.9* 8.4*  MG  --  1.8  --  1.3*  --   --   PHOS  --   --   --  1.9*  --   --    Liver Function Tests:  Recent Labs Lab 01/01/16 1128 01/01/16 1638 01/02/16 0330 01/03/16 0312 01/04/16 0315 01/05/16 0528  AST 379*  --  575* 824* 546* 274*  ALT 115*  --  202* 363* 309* 234*  ALKPHOS 87  --  63 63 84 120  BILITOT 7.2* 7.7* 8.5* 11.2* 10.0* 8.3*  PROT 7.1  --  5.0* 4.7* 4.7* 5.3*  ALBUMIN 4.2  --  2.8* 2.5* 2.6* 2.8*    Recent Labs Lab 01/01/16 1128  LIPASE 68*   Coagulation Profile:  Recent Labs Lab 01/01/16 1857 01/03/16 0547  INR 2.03 2.03    Recent Labs  01/03/16 0948  VITAMINB12 1,052*   Urine analysis:    Component Value Date/Time   COLORURINE AMBER (A) 05/18/2013 0830   APPEARANCEUR CLEAR 05/18/2013 0830   LABSPEC >1.046 (H) 05/18/2013 0830   PHURINE 5.5 05/18/2013 0830   GLUCOSEU NEGATIVE 05/18/2013 0830   HGBUR NEGATIVE 05/18/2013 0830   BILIRUBINUR NEGATIVE 05/18/2013 0830   KETONESUR NEGATIVE 05/18/2013 0830   PROTEINUR NEGATIVE 05/18/2013 0830   UROBILINOGEN 1.0 05/18/2013 0830   NITRITE NEGATIVE 05/18/2013 0830   LEUKOCYTESUR NEGATIVE 05/18/2013 0830    Recent Results (from the past 240 hour(s))  Blood Culture (routine x 2)     Status: None (Preliminary result)   Collection Time: 01/01/16 11:44 AM  Result Value Ref Range Status   Specimen Description BLOOD LEFT ARM  Final   Special Requests BOTTLES DRAWN AEROBIC AND ANAEROBIC 10 CC EACH  Final   Culture   Final    NO GROWTH 2 DAYS Performed at [facility]    Report Status PENDING  Incomplete  Blood Culture (routine x 2)     Status: None (Preliminary result)   Collection Time: 01/01/16 11:53 AM  Result Value Ref Range Status   Specimen Description BLOOD RIGHT ANTECUBITAL  Final   Special Requests BOTTLES DRAWN AEROBIC AND ANAEROBIC 5 CC  Final   Culture   Final    NO GROWTH 2 DAYS Performed at [facility]    Report Status PENDING  Incomplete  MRSA PCR Screening     Status: None   Collection Time: 01/01/16  4:09 PM  Result Value Ref Range Status   MRSA by PCR NEGATIVE NEGATIVE Final  Radiology Studies: No results found.  Scheduled Meds: . magnesium oxide  200 mg Oral BID  . ondansetron (ZOFRAN) IV  4 mg Intravenous Q6H  . pantoprazole  40 mg Oral BID  . phosphorus  250 mg Oral BID  . prednisoLONE  20 mg Oral BID  . sodium chloride flush  3 mL Intravenous Q12H   Continuous Infusions:   Faye Ramsay, MD  [facility] Pager [phone]  If 7PM-7AM, please contact night-coverage [email or web address] Password [email or web address]   If 7PM-7AM, please contact night-coverage [email or web address] Password [email or web address] 01/05/2016, 10:22 AM

## 2016-01-06 LAB — CULTURE, BLOOD (ROUTINE X 2)
Culture: NO GROWTH
Culture: NO GROWTH

## 2016-01-06 LAB — COMPREHENSIVE METABOLIC PANEL
ALBUMIN: 2.6 g/dL — AB (ref 3.5–5.0)
ALT: 172 U/L — AB (ref 17–63)
ANION GAP: 7 (ref 5–15)
AST: 162 U/L — AB (ref 15–41)
Alkaline Phosphatase: 108 U/L (ref 38–126)
BUN: 17 mg/dL (ref 6–20)
CHLORIDE: 99 mmol/L — AB (ref 101–111)
CO2: 31 mmol/L (ref 22–32)
Calcium: 7.9 mg/dL — ABNORMAL LOW (ref 8.9–10.3)
Creatinine, Ser: 0.63 mg/dL (ref 0.61–1.24)
GFR calc Af Amer: >60 mL/min (ref >60)
Glucose, Bld: 241 mg/dL — ABNORMAL HIGH (ref 65–99)
POTASSIUM: 2.9 mmol/L — AB (ref 3.5–5.1)
Sodium: 137 mmol/L (ref 135–145)
Total Bilirubin: 5 mg/dL — ABNORMAL HIGH (ref 0.3–1.2)
Total Protein: 4.9 g/dL — ABNORMAL LOW (ref 6.5–8.1)

## 2016-01-06 LAB — CBC
HEMATOCRIT: 30.5 % — AB (ref 39.0–52.0)
HEMOGLOBIN: 11 g/dL — AB (ref 13.0–17.0)
MCH: 42.6 pg — ABNORMAL HIGH (ref 26.0–34.0)
MCHC: 36.1 g/dL — ABNORMAL HIGH (ref 30.0–36.0)
MCV: 118.2 fL — AB (ref 78.0–100.0)
Platelets: 87 10*3/uL — ABNORMAL LOW (ref 150–400)
RBC: 2.58 MIL/uL — AB (ref 4.22–5.81)
RDW: 16.8 % — AB (ref 11.5–15.5)
WBC: 6.2 10*3/uL (ref 4.0–10.5)

## 2016-01-06 LAB — MAGNESIUM: Magnesium: 1.9 mg/dL (ref 1.7–2.4)

## 2016-01-06 MED ORDER — PREDNISOLONE 5 MG PO TABS: 20.0000 mg | ORAL_TABLET | Freq: Two times a day (BID) | ORAL | 0 refills | Status: DC

## 2016-01-06 MED ORDER — PREDNISONE 20 MG PO TABS: 20.0000 mg | ORAL_TABLET | Freq: Two times a day (BID) | ORAL | 0 refills | Status: AC

## 2016-01-06 MED ORDER — POTASSIUM CHLORIDE CRYS ER 20 MEQ PO TBCR
40.0000 meq | EXTENDED_RELEASE_TABLET | Freq: Two times a day (BID) | ORAL | Status: DC
  Administered 2016-01-06: 40 meq via ORAL
  Filled 2016-01-06: qty 2

## 2016-01-06 MED ORDER — MAGNESIUM OXIDE 400 (241.3 MG) MG PO TABS: 200.0000 mg | ORAL_TABLET | Freq: Every day | ORAL | 0 refills | Status: AC

## 2016-01-06 MED ORDER — ONDANSETRON HCL 4 MG PO TABS: 4.0000 mg | ORAL_TABLET | Freq: Four times a day (QID) | ORAL | 0 refills | Status: AC | PRN

## 2016-01-06 MED ORDER — POTASSIUM CHLORIDE CRYS ER 20 MEQ PO TBCR: 20.0000 meq | EXTENDED_RELEASE_TABLET | Freq: Every day | ORAL | 0 refills | Status: AC

## 2016-01-06 MED ORDER — PANTOPRAZOLE SODIUM 40 MG PO TBEC: 40.0000 mg | DELAYED_RELEASE_TABLET | Freq: Two times a day (BID) | ORAL | 0 refills | Status: AC

## 2016-01-06 NOTE — Progress Notes (Signed)
Physical Therapy Treatment Patient Details Name: Sean Jackson MRN: [id number] DOB: 01-30-1946 Today's Date: 01/06/2016    History of Present Illness 70 yo male admitted with N/V, SIRS, ETOH hepatitis, Hx of ETOH abuse    PT Comments    Progressing with mobility. No LOB this session however pt remains unsteady and at risk for falls. Instructed pt to have assistance with mobility initially.   Follow Up Recommendations  Home health PT;Supervision/Assistance - 24 hour (pt declines placement)     Equipment Recommendations   (pt states he will borrow walker)    Recommendations for Other Services       Precautions / Restrictions Precautions Precautions: Fall Restrictions Weight Bearing Restrictions: No    Mobility  Bed Mobility Overal bed mobility: Needs Assistance Bed Mobility: Supine to Sit;Sit to Supine     Supine to sit: Supervision Sit to supine: Supervision   General bed mobility comments: for safety  Transfers Overall transfer level: Needs assistance Equipment used: Rolling walker (2 wheeled) Transfers: Sit to/from Stand Sit to Stand: Min guard         General transfer comment: very close guarding. Pt able to rise after 2 attempts and with use of bedrail. VCs safety, hand placement  Ambulation/Gait Ambulation/Gait assistance: Min assist Ambulation Distance (Feet): 80 Feet Assistive device: Rolling walker (2 wheeled) Gait Pattern/deviations: Ataxic;Step-through pattern;Decreased step length - right;Decreased step length - left     General Gait Details: Gait remains ataxic. Assist to stabilize throughout distance. Cues for safety, distance from RW   Stairs            Wheelchair Mobility    Modified Rankin (Stroke Patients Only)       Balance           Standing balance support: Bilateral upper extremity supported Standing balance-Leahy Scale: Poor                      Cognition Arousal/Alertness: Awake/alert Behavior During  Therapy: WFL for tasks assessed/performed Overall Cognitive Status: Within Functional Limits for tasks assessed                      Exercises      General Comments        Pertinent Vitals/Pain Pain Assessment: No/denies pain    Home Living                      Prior Function            PT Goals (current goals can now be found in the care plan section) Progress towards PT goals: Progressing toward goals    Frequency  Min 3X/week    PT Plan Current plan remains appropriate    Co-evaluation             End of Session Equipment Utilized During Treatment: Gait belt Activity Tolerance: Patient tolerated treatment well Patient left: in bed;with call bell/phone within reach     Time: 0921-0930 PT Time Calculation (min) (ACUTE ONLY): 9 min  Charges:  $Gait Training: 8-22 mins                    G Codes:      Sean Jackson, MPT Pager: [phone]

## 2016-01-06 NOTE — Progress Notes (Signed)
Pt VSS. IV removed. AVS and follow up care instructions reviewed at bedside. HH PT to follow up at home. Prescriptions called into pharmacy. No further questions. Kizzie Ide, RN

## 2016-01-06 NOTE — Discharge Summary (Signed)
Physician Discharge Summary  Sean Jackson [id number] DOB: 06-02-1946 DOA: 01/01/2016  PCP: Sean Nip, MD  Admit date: 01/01/2016 Discharge date: 01/06/2016  Recommendations for Outpatient Follow-up:  1. Pt will need to follow up with PCP in 2-3 weeks post discharge 2. Please obtain BMP to evaluate electrolytes and kidney function, liver function tests 3. Please also check CBC to evaluate Hg and Hct levels 4. Dr. Oletta Jackson with GI recommended discharge on Prednisolone but since too expensive was OK to changing to Prednisone, pt to see Dr. Oletta Jackson in follow up, he was made aware  5. Pt strongly advised abstinence from alcohol use   Discharge Diagnoses:  Principal Problem:   Alcohol abuse   Hyperbilirubinemia   Transaminitis  Discharge Condition: Stable  Diet recommendation: Heart healthy diet discussed in details    Brief Narrative: 70 year old with ETOH abuse, presented to [place] [facility] on 7/13 with persistent nausea and vomiting in the last couple days. Denies any blood in his emesis. He was admitted to step down with elevated LFTs and gastroenterology was consulted.  Assessment & Plan:  Acute alcoholic hepatitis - Liver enzymes trending down, please see blood work analysis below - CT abdominal pelvis unremarkable - Right upper quadrant ultrasound without gallbladder pathology, without any CBD dilation.  - Doppler signal without any thrombus - acute hepatitis panel negative, Tylenol level negative, sedimentation rate negative - Please note that patient is rather anxious about going home says he is too weak - PT evaluation requested, HH PT recommended and orders have been placed  - D/C on Prednisone as Dr. Oletta Jackson recommended   Alcohol abuse - Patient appears to be minimizing alcohol intake, he is not sure when his last alcoholic drink was - no signs of withdrawal - Alcohol level negative in the emergency room  Acute kidney injury - Creatinine on  admission 2.24, received IV fluids >> 1.02 >> 0.49, remain WNL  Hypokalemia/hypomagnesemia/hypophosphatemia - Possibly in the setting of alcohol abuse, poor nutrition, and GI losses given vomiting  - will need K supplement upon discharge, script given   Thrombocytopenia - Likely in the setting of underlying liver disease/ongoing alcohol abuse - No bleeding is apparent - improving overall   Suspect underlying cirrhosis - With thrombocytopenia, elevated bilirubin, elevated INR - close outpatient follow up   Anemia with macrocytosis - Likely due to alcohol abuse, overall stable   DVT prophylaxis: SCDs Code Status: Full code Family Communication: No family at bedside Disposition Plan: D/C   Consultants:   Gastroenterology  PT   Procedures:   None   Antimicrobials:  Levaquin 7/30 >> 8/2  Metronidazole 7/30 >> 8/2  Procedures/Studies: Ct Abdomen Pelvis Wo Contrast  Result Date: 01/01/2016 CLINICAL DATA:  Abdominal pain and vomiting. EXAM: CT ABDOMEN AND PELVIS WITHOUT CONTRAST TECHNIQUE: Multidetector CT imaging of the abdomen and pelvis was performed following the standard protocol without IV contrast. COMPARISON:  None. FINDINGS: Lower chest: Diffuse peripheral interstitial coarsening bronchiectasis is noted at the lung bases bilaterally. No focal airspace consolidation is present. The heart is mildly enlarged. Coronary artery calcifications are present. Hepatobiliary: Extensive fatty infiltration of the liver is noted. No discrete mass lesion is present. The liver contours are smooth. The common bile duct is within normal limits. Gallbladder is unremarkable. Pancreas: No significant inflammatory changes are present. No focal cystic or solid mass lesion is present. There is no ductal dilation. Spleen: Normal size in appearance Adrenals/Urinary Tract: The adrenal glands are normal bilaterally. The kidneys and ureters  are unremarkable. No mass lesion or stone is present.  There is mild perinephric stranding bilaterally. The urinary bladder is within normal limits. Stomach/Bowel: The stomach is mildly dilated with a fluid level. No obstructing lesion is present. The duodenum is within normal limits. The small bowel is unremarkable. Contrast extends to the lower mid abdomen without a discrete transition point. The more distal bowel and terminal ileum are within normal limits. The appendix is visualized and normal. The cecum is within normal limits. Diverticular changes are present within the ascending colon without focal inflammation. Diverticula are also present in the descending and sigmoid colon without focal inflammation. Vascular/Lymphatic: Atherosclerotic calcifications are present within the aorta and branch vessels without aneurysm. No significant adenopathy is present. Reproductive: Unremarkable Other: No free fluid or free air is present. Musculoskeletal: Grade 1 degenerative anterolisthesis is present at L4-5. There slight retrolisthesis at L2-3. No focal lytic or blastic lesions are present. Vertebral body heights are maintained. Advanced facet hypertrophy is present at L4-5 and L5-S1. The bony pelvis is intact. IMPRESSION: 1. Mild dilation of the stomach without an obstructing lesion. This is nonspecific and may be within normal limits. 2. Colonic diverticular changes within the ascending colon, descending colon and proximal sigmoid colon without focal inflammation to suggest diverticulitis. 3. Diffuse hepatic steatosis. No discrete lesions or evidence for cirrhosis are present. 4. Grade 1 degenerative anterolisthesis at L4-5 secondary to advanced facet hypertrophy. Electronically Signed   By: San Morelle M.D.   On: 01/01/2016 12:50  [place] Art/ven Flow Abd Pelv Doppler  Result Date: 01/02/2016 CLINICAL DATA:  Elevated LFTs. Fatty liver suggested on earlier ultrasound. EXAM: DUPLEX ULTRASOUND OF LIVER TECHNIQUE: Color and duplex Doppler ultrasound was performed to  evaluate the hepatic in-flow and out-flow vessels. COMPARISON:  CT from previous day FINDINGS: Portal Vein: No occlusion or thrombus. Hepatopetal flow. Velocities Main:  20-29 cm/sec Right:  28 cm/sec Left:  23 cm/sec Hepatic Vein Velocities Right:  Hepatofugal, 19 cm/sec Middle:  Hepatofugal, 16 cm/sec Left:  Not visualized secondary to overlying structures. Hepatic Artery Velocity:  58 cm/sec Spleen 5.6 x 7.3 x 3.6 cm (volume = 76.5 cc). Splenic Vein, no occlusion or thrombus. Velocity: 25 cm/sec Varices: None identified Ascites: Absent IMPRESSION: 1. Unremarkable hepatic Doppler evaluation. Left hepatic vein was obscured by overlying structures. Electronically Signed   By: Lucrezia Europe M.D.   On: 01/02/2016 10:09  Dg Chest Port 1 View  Result Date: 01/01/2016 CLINICAL DATA:  Sepsis EXAM: PORTABLE CHEST 1 VIEW COMPARISON:  CT 08/03/2015 FINDINGS: Heart is normal size. Left lower lobe atelectasis or infiltrate. No visible effusion. Right lung is clear. No acute bony abnormality. IMPRESSION: Left lower lobe atelectasis or infiltrate. Electronically Signed   By: Rolm Baptise M.D.   On: 01/01/2016 12:30  US Abdomen Limited Ruq  Result Date: 01/02/2016 CLINICAL DATA:  70 year old male with hyperbilirubinemia. Subsequent encounter. EXAM: US ABDOMEN LIMITED - RIGHT UPPER QUADRANT COMPARISON:  01/01/2016 CT. FINDINGS: Gallbladder: No gallstones. Gallbladder wall thickness normal 2 top-normal. No pericholecystic fluid. No sonographic Murphy sign noted by sonographer. Common bile duct: Diameter: 2.3 mm. Liver: Diffuse increased echogenicity consistent with fatty infiltration without focal mass identified. Left lobe of the liver is poorly delineated secondary to overlying bowel gas. IMPRESSION: No gallstones. Gallbladder wall thickness normal to top normal. No pericholecystic fluid. No tenderness over the gallbladder during scanning. Diffuse fatty infiltration of the liver. Evaluation of the left lobe of the liver is  limited by bowel gas. Proximal common bile duct  is not dilated. Mid to distal aspect not visualized secondary to bowel gas. Electronically Signed   By: Genia Del M.D.   On: 01/02/2016 07:29   Discharge Exam: Vitals:   01/05/16 2134 01/06/16 0541  BP: 119/69 109/66  Pulse: 90 84  Resp: 18 16  Temp: 98.2 F (36.8 C) 98.4 F (36.9 C)   Vitals:   01/05/16 1200 01/05/16 1400 01/05/16 2134 01/06/16 0541  BP:  111/60 119/69 109/66  Pulse: 86 90 90 84  Resp:  18 18 16   Temp:  98 F (36.7 C) 98.2 F (36.8 C) 98.4 F (36.9 C)  TempSrc:  Oral Oral Oral  SpO2:  92% 95% 96%  Weight:      Height:        General: Pt is alert, follows commands appropriately, not in acute distress Cardiovascular: Regular rate and rhythm, S1/S2 +,  no rubs, no gallops Respiratory: Clear to auscultation bilaterally, no wheezing, no crackles, no rhonchi Abdominal: Soft, non tender, non distended, bowel sounds +, no guarding Extremities: no edema, no cyanosis, pulses palpable bilaterally DP and PT  Discharge Instructions  Discharge Instructions    Diet - low sodium heart healthy    Complete by:  As directed   Increase activity slowly    Complete by:  As directed       Medication List    TAKE these medications   magnesium oxide 400 (241.3 Mg) MG tablet Commonly known as:  MAG-OX Take 0.5 tablets (200 mg total) by mouth daily.   multivitamin with minerals tablet Take 1 tablet by mouth daily. 1 tablet my mouth daily   ondansetron 4 MG tablet Commonly known as:  ZOFRAN Take 1 tablet (4 mg total) by mouth every 6 (six) hours as needed for nausea.   pantoprazole 40 MG tablet Commonly known as:  PROTONIX Take 1 tablet (40 mg total) by mouth 2 (two) times daily.   potassium chloride SA 20 MEQ tablet Commonly known as:  K-DUR,KLOR-CON Take 1 tablet (20 mEq total) by mouth daily.   predniSONE 20 MG tablet Commonly known as:  DELTASONE Take 1 tablet (20 mg total) by mouth 2 (two) times daily  with a meal.       Follow-up Information    Collene Gobble., MD Follow up on 01/31/2016.   Specialty:  Pulmonary Disease Why:  Appt at 2:15  Contact information: [address] [place] [address] [phone]        [place] .   Why:  physical therapy Contact information: [place] [address] [phone]        Sean Nip, MD .   Specialty:  [facility] Contact information: [place] [address] [phone]        Faye Ramsay, MD .   Specialty:  Internal Medicine Why:  call my cell with questions [phone] Contact information: [address] [place] [address] [phone]            The results of significant diagnostics from this hospitalization (including imaging, microbiology, ancillary and laboratory) are listed below for reference.     Microbiology: Recent Results (from the past 240 hour(s))  Blood Culture (routine x 2)     Status: None (Preliminary result)   Collection Time: 01/01/16 11:44 AM  Result Value Ref Range Status   Specimen Description BLOOD LEFT ARM  Final   Special Requests BOTTLES DRAWN AEROBIC AND ANAEROBIC 10 CC  EACH  Final   Culture   Final    NO GROWTH 4 DAYS Performed at [facility]    Report Status PENDING  Incomplete  Blood Culture (routine x 2)     Status: None (Preliminary result)   Collection Time: 01/01/16 11:53 AM  Result Value Ref Range Status   Specimen Description BLOOD RIGHT ANTECUBITAL  Final   Special Requests BOTTLES DRAWN AEROBIC AND ANAEROBIC 5 CC  Final   Culture   Final    NO GROWTH 4 DAYS Performed at [facility]    Report Status PENDING  Incomplete  MRSA PCR Screening     Status: None   Collection Time: 01/01/16  4:09 PM  Result Value Ref Range Status   MRSA by PCR NEGATIVE NEGATIVE Final    Comment:        The GeneXpert MRSA Assay (FDA approved for NASAL  specimens only), is one component of a comprehensive MRSA colonization surveillance program. It is not intended to diagnose MRSA infection nor to guide or monitor treatment for MRSA infections.      Labs: Basic Metabolic Panel:  Recent Labs Lab 01/01/16 1130 01/02/16 0330 01/03/16 0312 01/04/16 0315 01/05/16 0528 01/06/16 0501  NA  --  136 136 134* 138 137  K  --  3.5 3.1* 3.6 3.5 2.9*  CL  --  103 101 99* 99* 99*  CO2  --  22 28 28 31 31   GLUCOSE  --  79 97 223* 183* 241*  BUN  --  23* 16 12 18 17   CREATININE  --  1.02 0.49* 0.30* 0.42* 0.63  CALCIUM  --  8.1* 7.9* 7.9* 8.4* 7.9*  MG 1.8  --  1.3*  --   --  1.9  PHOS  --   --  1.9*  --   --   --    Liver Function Tests:  Recent Labs Lab 01/02/16 0330 01/03/16 0312 01/04/16 0315 01/05/16 0528 01/06/16 0501  AST 575* 824* 546* 274* 162*  ALT 202* 363* 309* 234* 172*  ALKPHOS 63 63 84 120 108  BILITOT 8.5* 11.2* 10.0* 8.3* 5.0*  PROT 5.0* 4.7* 4.7* 5.3* 4.9*  ALBUMIN 2.8* 2.5* 2.6* 2.8* 2.6*    Recent Labs Lab 01/01/16 1128  LIPASE 68*   No results for input(s): AMMONIA in the last 168 hours. CBC:  Recent Labs Lab 01/02/16 0330 01/03/16 0312 01/03/16 0948 01/04/16 0315 01/05/16 0528 01/06/16 0501  WBC 8.9 4.9  --  3.8* 6.8 6.2  HGB 12.9* 11.4*  --  11.2* 12.0* 11.0*  HCT 35.9* 32.7* 33.0* 30.9* 32.5* 30.5*  MCV 120.5* 120.7*  --  117.9* 116.9* 118.2*  PLT 99* 52*  --  66* 88* 87*     SIGNED: Time coordinating discharge:  30 minutes  MAGICK-Kadon Andrus, MD  [facility] 01/06/2016, 11:21 AM Pager [phone]  If 7PM-7AM, please contact night-coverage [email or web address] Password [email or web address]

## 2016-01-06 NOTE — Progress Notes (Signed)
Discharge planning, spoke with patient at beside. [facility] for [facility] services, contacted [facility] for referral. [phone]

## 2016-01-06 NOTE — Discharge Instructions (Signed)
Aspartate Aminotransferase (AST) Test WHY AM I HAVING THIS TEST? Aspartate aminotransferase (AST) test identifies suspected liver diseases. AST is an enzyme that is released into the blood when liver or muscle cells are injured. This test is usually done when there are signs of liver problems. WHAT KIND OF SAMPLE IS TAKEN? A blood sample is required for this test. It is usually collected by inserting a needle into a vein. HOW DO I PREPARE FOR THE TEST?  Your health care provider may instruct you to avoid taking medicines, particularly those that require intramuscular injection (IM) 12 hours before the test. Follow your health care provider's instructions.  This test usually requires that a blood sample be collected once each day for three days, and then again in one week. WHAT ARE THE REFERENCE RANGES? Reference ranges are considered healthy ranges established after testing a large group of healthy people. Reference ranges may vary among different people, labs, and hospitals. It is your responsibility to obtain your test results. Ask the lab or department performing the test when and how you will get your results. Reference ranges for AST are the following:  66-41 days old: 35-140 units/L.  Less than 56 years old: 15-60 units/L.  72-53 years old: 15-50 units/L.  53-20 years old: 10-50 units/L.  60-62 years old: 10-40 units/L.  Adult: 0-35 units/L or 0-0.58 microkat/L.  Elderly: The reference range is slightly higher than the adult range. Females tend to have slightly lower levels than males.  WHAT DO THE RESULTS MEAN? Results higher than the reference ranges may indicate:  Liver diseases.  Skeletal muscle diseases.  Other disease that destroy the red blood cells or tissues of the pancreas. Results lower than the reference ranges may indicate:  Acute kidney disease.  Pregnancy.  Diabetic ketoacidosis.  Chronic kidney dialysis.  Symptomatic vitamin B1 deficiency. Talk with  your health care provider to discuss your results, treatment options, and if necessary, the need for more tests. Talk with your health care provider if you have any questions about your results.   This information is not intended to replace advice given to you by your health care provider. Make sure you discuss any questions you have with your health care provider.   Document Released: 06/13/2004 Document Revised: 06/11/2014 Document Reviewed: 10/19/2013 Elsevier Interactive Patient Education [employer].

## 2016-01-12 ENCOUNTER — Emergency Department (HOSPITAL_COMMUNITY): Payer: Medicare Other

## 2016-01-12 ENCOUNTER — Inpatient Hospital Stay (HOSPITAL_COMMUNITY)
Admission: EM | Admit: 2016-01-12 | Discharge: 2016-02-03 | DRG: 175 | Disposition: E | Payer: Medicare Other | Attending: Internal Medicine | Admitting: Internal Medicine

## 2016-01-12 ENCOUNTER — Encounter (HOSPITAL_COMMUNITY): Payer: Self-pay

## 2016-01-12 ENCOUNTER — Telehealth: Payer: Self-pay | Admitting: Emergency Medicine

## 2016-01-12 DIAGNOSIS — J189 Pneumonia, unspecified organism: Secondary | ICD-10-CM

## 2016-01-12 DIAGNOSIS — Z7952 Long term (current) use of systemic steroids: Secondary | ICD-10-CM | POA: Diagnosis not present

## 2016-01-12 DIAGNOSIS — I2782 Chronic pulmonary embolism: Secondary | ICD-10-CM | POA: Diagnosis present

## 2016-01-12 DIAGNOSIS — N179 Acute kidney failure, unspecified: Secondary | ICD-10-CM | POA: Diagnosis not present

## 2016-01-12 DIAGNOSIS — I824Z3 Acute embolism and thrombosis of unspecified deep veins of distal lower extremity, bilateral: Secondary | ICD-10-CM | POA: Diagnosis not present

## 2016-01-12 DIAGNOSIS — I82431 Acute embolism and thrombosis of right popliteal vein: Secondary | ICD-10-CM | POA: Diagnosis present

## 2016-01-12 DIAGNOSIS — I2690 Septic pulmonary embolism without acute cor pulmonale: Secondary | ICD-10-CM | POA: Diagnosis not present

## 2016-01-12 DIAGNOSIS — J810 Acute pulmonary edema: Secondary | ICD-10-CM

## 2016-01-12 DIAGNOSIS — F1020 Alcohol dependence, uncomplicated: Secondary | ICD-10-CM | POA: Diagnosis present

## 2016-01-12 DIAGNOSIS — J9690 Respiratory failure, unspecified, unspecified whether with hypoxia or hypercapnia: Secondary | ICD-10-CM

## 2016-01-12 DIAGNOSIS — I82403 Acute embolism and thrombosis of unspecified deep veins of lower extremity, bilateral: Secondary | ICD-10-CM

## 2016-01-12 DIAGNOSIS — J9621 Acute and chronic respiratory failure with hypoxia: Secondary | ICD-10-CM | POA: Diagnosis not present

## 2016-01-12 DIAGNOSIS — I2601 Septic pulmonary embolism with acute cor pulmonale: Secondary | ICD-10-CM | POA: Diagnosis not present

## 2016-01-12 DIAGNOSIS — I82491 Acute embolism and thrombosis of other specified deep vein of right lower extremity: Secondary | ICD-10-CM | POA: Diagnosis not present

## 2016-01-12 DIAGNOSIS — D539 Nutritional anemia, unspecified: Secondary | ICD-10-CM | POA: Diagnosis present

## 2016-01-12 DIAGNOSIS — I82492 Acute embolism and thrombosis of other specified deep vein of left lower extremity: Secondary | ICD-10-CM | POA: Diagnosis not present

## 2016-01-12 DIAGNOSIS — Z66 Do not resuscitate: Secondary | ICD-10-CM | POA: Diagnosis present

## 2016-01-12 DIAGNOSIS — I2699 Other pulmonary embolism without acute cor pulmonale: Secondary | ICD-10-CM | POA: Diagnosis present

## 2016-01-12 DIAGNOSIS — I272 Other secondary pulmonary hypertension: Secondary | ICD-10-CM | POA: Diagnosis present

## 2016-01-12 DIAGNOSIS — I82412 Acute embolism and thrombosis of left femoral vein: Secondary | ICD-10-CM | POA: Diagnosis present

## 2016-01-12 DIAGNOSIS — K7010 Alcoholic hepatitis without ascites: Secondary | ICD-10-CM | POA: Diagnosis present

## 2016-01-12 DIAGNOSIS — J8410 Pulmonary fibrosis, unspecified: Secondary | ICD-10-CM | POA: Diagnosis present

## 2016-01-12 DIAGNOSIS — T380X5A Adverse effect of glucocorticoids and synthetic analogues, initial encounter: Secondary | ICD-10-CM | POA: Diagnosis present

## 2016-01-12 DIAGNOSIS — Z515 Encounter for palliative care: Secondary | ICD-10-CM | POA: Diagnosis not present

## 2016-01-12 DIAGNOSIS — R9431 Abnormal electrocardiogram [ECG] [EKG]: Secondary | ICD-10-CM | POA: Diagnosis not present

## 2016-01-12 DIAGNOSIS — R0902 Hypoxemia: Secondary | ICD-10-CM

## 2016-01-12 DIAGNOSIS — J9600 Acute respiratory failure, unspecified whether with hypoxia or hypercapnia: Secondary | ICD-10-CM

## 2016-01-12 DIAGNOSIS — M7989 Other specified soft tissue disorders: Secondary | ICD-10-CM | POA: Diagnosis not present

## 2016-01-12 DIAGNOSIS — F1010 Alcohol abuse, uncomplicated: Secondary | ICD-10-CM | POA: Diagnosis not present

## 2016-01-12 DIAGNOSIS — I2609 Other pulmonary embolism with acute cor pulmonale: Secondary | ICD-10-CM | POA: Diagnosis present

## 2016-01-12 DIAGNOSIS — J9601 Acute respiratory failure with hypoxia: Secondary | ICD-10-CM | POA: Diagnosis not present

## 2016-01-12 DIAGNOSIS — R0602 Shortness of breath: Secondary | ICD-10-CM | POA: Diagnosis present

## 2016-01-12 DIAGNOSIS — J849 Interstitial pulmonary disease, unspecified: Secondary | ICD-10-CM | POA: Diagnosis not present

## 2016-01-12 DIAGNOSIS — I825Z3 Chronic embolism and thrombosis of unspecified deep veins of distal lower extremity, bilateral: Secondary | ICD-10-CM | POA: Diagnosis not present

## 2016-01-12 DIAGNOSIS — R740 Nonspecific elevation of levels of transaminase and lactic acid dehydrogenase [LDH]: Secondary | ICD-10-CM | POA: Diagnosis not present

## 2016-01-12 DIAGNOSIS — Y95 Nosocomial condition: Secondary | ICD-10-CM | POA: Diagnosis not present

## 2016-01-12 LAB — COMPREHENSIVE METABOLIC PANEL
ALK PHOS: 296 U/L — AB (ref 38–126)
ALT: 325 U/L — AB (ref 17–63)
AST: 218 U/L — AB (ref 15–41)
Albumin: 3.2 g/dL — ABNORMAL LOW (ref 3.5–5.0)
Anion gap: 9 (ref 5–15)
BILIRUBIN TOTAL: 4.3 mg/dL — AB (ref 0.3–1.2)
BUN: 14 mg/dL (ref 6–20)
CHLORIDE: 103 mmol/L (ref 101–111)
CO2: 26 mmol/L (ref 22–32)
CREATININE: 0.66 mg/dL (ref 0.61–1.24)
Calcium: 8.7 mg/dL — ABNORMAL LOW (ref 8.9–10.3)
GFR calc Af Amer: >60 mL/min (ref >60)
Glucose, Bld: 166 mg/dL — ABNORMAL HIGH (ref 65–99)
Potassium: 4 mmol/L (ref 3.5–5.1)
Sodium: 138 mmol/L (ref 135–145)
Total Protein: 7 g/dL (ref 6.5–8.1)

## 2016-01-12 LAB — DIFFERENTIAL
BASOS PCT: 0 %
Basophils Absolute: 0 10*3/uL (ref 0.0–0.1)
Eosinophils Absolute: 0 10*3/uL (ref 0.0–0.7)
Eosinophils Relative: 0 %
LYMPHS PCT: 7 %
Lymphs Abs: 1.7 10*3/uL (ref 0.7–4.0)
MONOS PCT: 5 %
Monocytes Absolute: 1.2 10*3/uL — ABNORMAL HIGH (ref 0.1–1.0)
NEUTROS ABS: 20.8 10*3/uL — AB (ref 1.7–7.7)
Neutrophils Relative %: 88 %

## 2016-01-12 LAB — CBC WITH DIFFERENTIAL/PLATELET
BASOS PCT: 0 %
Basophils Absolute: 0 10*3/uL (ref 0.0–0.1)
EOS PCT: 0 %
Eosinophils Absolute: 0 10*3/uL (ref 0.0–0.7)
HEMATOCRIT: 39.1 % (ref 39.0–52.0)
HEMOGLOBIN: 13.4 g/dL (ref 13.0–17.0)
LYMPHS PCT: 3 %
Lymphs Abs: 0.8 10*3/uL (ref 0.7–4.0)
MCH: 42.3 pg — ABNORMAL HIGH (ref 26.0–34.0)
MCHC: 34.3 g/dL (ref 30.0–36.0)
MCV: 123.3 fL — AB (ref 78.0–100.0)
MONOS PCT: 4 %
Monocytes Absolute: 1 10*3/uL (ref 0.1–1.0)
NEUTROS PCT: 93 %
Neutro Abs: 23.4 10*3/uL — ABNORMAL HIGH (ref 1.7–7.7)
PLATELETS: 326 10*3/uL (ref 150–400)
RBC: 3.17 MIL/uL — ABNORMAL LOW (ref 4.22–5.81)
RDW: 18.8 % — ABNORMAL HIGH (ref 11.5–15.5)
WBC: 25.2 10*3/uL — ABNORMAL HIGH (ref 4.0–10.5)

## 2016-01-12 LAB — BLOOD GAS, ARTERIAL
Acid-Base Excess: 2.3 mmol/L — ABNORMAL HIGH (ref 0.0–2.0)
Bicarbonate: 24.1 mEq/L — ABNORMAL HIGH (ref 20.0–24.0)
Drawn by: 441261
FIO2: 1
O2 Saturation: 96.3 %
PCO2 ART: 29.6 mmHg — AB (ref 35.0–45.0)
PH ART: 7.522 — AB (ref 7.350–7.450)
Patient temperature: 98.6
TCO2: 21.2 mmol/L (ref 0–100)
pO2, Arterial: 88.4 mmHg (ref 80.0–100.0)

## 2016-01-12 LAB — CBC
HEMATOCRIT: 36.5 % — AB (ref 39.0–52.0)
HEMOGLOBIN: 12.5 g/dL — AB (ref 13.0–17.0)
MCH: 42.1 pg — AB (ref 26.0–34.0)
MCHC: 34.2 g/dL (ref 30.0–36.0)
MCV: 122.9 fL — ABNORMAL HIGH (ref 78.0–100.0)
Platelets: 327 10*3/uL (ref 150–400)
RBC: 2.97 MIL/uL — ABNORMAL LOW (ref 4.22–5.81)
RDW: 18.9 % — ABNORMAL HIGH (ref 11.5–15.5)
WBC: 23.7 10*3/uL — ABNORMAL HIGH (ref 4.0–10.5)

## 2016-01-12 LAB — I-STAT CHEM 8, ED
BUN: 13 mg/dL (ref 6–20)
CALCIUM ION: 1.12 mmol/L (ref 1.12–1.23)
CHLORIDE: 101 mmol/L (ref 101–111)
Creatinine, Ser: 0.6 mg/dL — ABNORMAL LOW (ref 0.61–1.24)
Glucose, Bld: 158 mg/dL — ABNORMAL HIGH (ref 65–99)
HEMATOCRIT: 45 % (ref 39.0–52.0)
Hemoglobin: 15.3 g/dL (ref 13.0–17.0)
Potassium: 3.9 mmol/L (ref 3.5–5.1)
SODIUM: 139 mmol/L (ref 135–145)
TCO2: 28 mmol/L (ref 0–100)

## 2016-01-12 LAB — PROTIME-INR
INR: 1.24
PROTHROMBIN TIME: 15.6 s — AB (ref 11.4–15.2)

## 2016-01-12 LAB — BRAIN NATRIURETIC PEPTIDE: B Natriuretic Peptide: 579.7 pg/mL — ABNORMAL HIGH (ref 0.0–100.0)

## 2016-01-12 LAB — APTT: APTT: 57 s — AB (ref 24–36)

## 2016-01-12 LAB — TROPONIN I: Troponin I: 0.05 ng/mL (ref <0.03)

## 2016-01-12 MED ORDER — ADULT MULTIVITAMIN W/MINERALS CH
1.0000 | ORAL_TABLET | Freq: Every day | ORAL | Status: DC
  Administered 2016-01-13 – 2016-01-23 (×11): 1 via ORAL
  Filled 2016-01-12 (×11): qty 1

## 2016-01-12 MED ORDER — ONDANSETRON HCL 4 MG PO TABS: 4.0000 mg | ORAL_TABLET | Freq: Four times a day (QID) | ORAL | Status: DC | PRN

## 2016-01-12 MED ORDER — HEPARIN BOLUS VIA INFUSION
2000.0000 [IU] | Freq: Once | INTRAVENOUS | Status: AC
  Administered 2016-01-12: 2000 [IU] via INTRAVENOUS
  Filled 2016-01-12: qty 2000

## 2016-01-12 MED ORDER — SODIUM CHLORIDE 0.9% FLUSH: 3.0000 mL | INTRAVENOUS | Status: DC | PRN

## 2016-01-12 MED ORDER — ACETAMINOPHEN 325 MG PO TABS: 650.0000 mg | ORAL_TABLET | Freq: Four times a day (QID) | ORAL | Status: DC | PRN

## 2016-01-12 MED ORDER — SODIUM CHLORIDE 0.9% FLUSH
3.0000 mL | Freq: Two times a day (BID) | INTRAVENOUS | Status: DC
  Administered 2016-01-12 – 2016-01-25 (×19): 3 mL via INTRAVENOUS

## 2016-01-12 MED ORDER — PANTOPRAZOLE SODIUM 40 MG PO TBEC
40.0000 mg | DELAYED_RELEASE_TABLET | Freq: Two times a day (BID) | ORAL | Status: DC
  Administered 2016-01-12 – 2016-01-23 (×22): 40 mg via ORAL
  Filled 2016-01-12 (×22): qty 1

## 2016-01-12 MED ORDER — MAGNESIUM OXIDE 400 (241.3 MG) MG PO TABS
200.0000 mg | ORAL_TABLET | Freq: Every day | ORAL | Status: DC
  Administered 2016-01-13 – 2016-01-23 (×11): 200 mg via ORAL
  Filled 2016-01-12 (×11): qty 1

## 2016-01-12 MED ORDER — HEPARIN (PORCINE) IN NACL 100-0.45 UNIT/ML-% IJ SOLN
1050.0000 [IU]/h | INTRAMUSCULAR | Status: DC
  Administered 2016-01-12 – 2016-01-13 (×2): 1100 [IU]/h via INTRAVENOUS
  Filled 2016-01-12 (×2): qty 250

## 2016-01-12 MED ORDER — IOPAMIDOL (ISOVUE-370) INJECTION 76%
100.0000 mL | Freq: Once | INTRAVENOUS | Status: AC | PRN
  Administered 2016-01-12: 70 mL via INTRAVENOUS

## 2016-01-12 MED ORDER — SODIUM CHLORIDE 0.9 % IV SOLN
250.0000 mL | INTRAVENOUS | Status: DC | PRN
  Administered 2016-01-12: 250 mL via INTRAVENOUS

## 2016-01-12 MED ORDER — PREDNISONE 20 MG PO TABS
20.0000 mg | ORAL_TABLET | Freq: Two times a day (BID) | ORAL | Status: DC
  Administered 2016-01-12 – 2016-01-18 (×12): 20 mg via ORAL
  Filled 2016-01-12 (×12): qty 1

## 2016-01-12 MED ORDER — POTASSIUM CHLORIDE CRYS ER 20 MEQ PO TBCR
20.0000 meq | EXTENDED_RELEASE_TABLET | Freq: Every day | ORAL | Status: DC
  Administered 2016-01-12 – 2016-01-15 (×4): 20 meq via ORAL
  Filled 2016-01-12 (×4): qty 1

## 2016-01-12 MED ORDER — ACETAMINOPHEN 650 MG RE SUPP: 650.0000 mg | Freq: Four times a day (QID) | RECTAL | Status: DC | PRN

## 2016-01-12 NOTE — ED Notes (Signed)
Patient placed on non rebreather 15L/min  Sats increased to 93-94%

## 2016-01-12 NOTE — H&P (Signed)
History and Physical    GIOVANNI CHRZANOWSKI [id number] DOB: 1945-11-24 DOA: 01/14/2016    PCP: Aretta Nip, MD  Patient coming from: home with brother  Chief Complaint: shortness of breath  HPI: COTTON POIROT is a 70 y.o. male with medical history significant of alcohol abuse with recent admission for alcoholic hepatitis on steroid treatment, chronic thrombocytopenia, cirrhosis, PE in the past (Xarelto in 2014) and anemia who presents for dyspnea x 3-4 days. Found to be hypoxic in the ER.    ED Course: CT chest shows acute PE in right main pulmonary artery . On non-rebreather, hemodynamically stable- started on Heparin infusion  Review of Systems:  All other systems reviewed and apart from HPI, are negative.  Past Medical History:  Diagnosis Date  . Closed fracture of left wrist   . DVT   . ETOH abuse   . Hypokalemia   . Interstitial lung disease ([place])   . Pulmonary embolism     Past Surgical History:  Procedure Laterality Date  . ANKLE FRACTURE SURGERY    . LEFT HAND SURGERY    . RIGHT HEART CATHETERIZATION N/A 05/19/2013   Procedure: RIGHT HEART CATH;  Surgeon: Jolaine Artist, MD;  Location: [facility] CATH LAB;  Service: Cardiovascular;  Laterality: N/A;    Social History:   reports that he has never smoked. He has never used smokeless tobacco. He reports that he drinks about 0.5 oz of alcohol per week . He reports that he does not use drugs.  No Known Allergies  Family History  Problem Relation Age of Onset  . Heart attack Father 70     Prior to Admission medications   Medication Sig Start Date End Date Taking? Authorizing Provider  magnesium oxide (MAG-OX) 400 (241.3 Mg) MG tablet Take 0.5 tablets (200 mg total) by mouth daily. 01/06/16  Yes Theodis Blaze, MD  Multiple Vitamins-Minerals (MULTIVITAMIN WITH MINERALS) tablet Take 1 tablet by mouth daily.    Yes Historical Provider, MD  ondansetron (ZOFRAN) 4 MG tablet Take 1 tablet (4 mg total) by mouth every 6  (six) hours as needed for nausea. 01/06/16  Yes Theodis Blaze, MD  pantoprazole (PROTONIX) 40 MG tablet Take 1 tablet (40 mg total) by mouth 2 (two) times daily. 01/06/16  Yes Theodis Blaze, MD  potassium chloride SA (K-DUR,KLOR-CON) 20 MEQ tablet Take 1 tablet (20 mEq total) by mouth daily. 01/06/16  Yes Theodis Blaze, MD  predniSONE (DELTASONE) 20 MG tablet Take 1 tablet (20 mg total) by mouth 2 (two) times daily with a meal. 01/06/16  Yes Theodis Blaze, MD    Physical Exam: Vitals:   01/11/2016 1613 01/20/2016 1630 01/29/2016 1730 01/03/2016 1736  BP: 139/81 137/80 141/83 155/96  Pulse: 79 72 71 71  Resp: 25 16 (!) 29 23  Temp:      TempSrc:      SpO2: 100% 100% 97% 97%  Weight:      Height:          Constitutional: NAD, calm, comfortable Eyes: PERTLA, lids and conjunctivae normal ENMT: Mucous membranes are moist. Posterior pharynx clear of any exudate or lesions. Normal dentition.  Neck: normal, supple, no masses, no thyromegaly Respiratory: clear to auscultation bilaterally, no wheezing, no crackles. Normal respiratory effort. No accessory muscle use.  Cardiovascular: S1 & S2 heard, regular rate and rhythm, no murmurs / rubs / gallops. No extremity edema. 2+ pedal pulses. No carotid bruits.  Abdomen: No distension, no tenderness,  no masses palpated. No hepatosplenomegaly. Bowel sounds normal.  Musculoskeletal: no clubbing / cyanosis. No joint deformity upper and lower extremities. Good ROM, no contractures. Normal muscle tone.  Skin: no rashes, lesions, ulcers. No induration Neurologic: CN 2-12 grossly intact. Sensation intact, DTR normal. Strength 5/5 in all 4 limbs.  Psychiatric: Normal judgment and insight. Alert and oriented x 3. Normal mood.     Labs on Admission: I have personally reviewed following labs and imaging studies  CBC:  Recent Labs Lab 01/06/16 0501 01/09/2016 1350 02/01/2016 1437  WBC 6.2 25.2*  --   NEUTROABS  --  23.4*  --   HGB 11.0* 13.4 15.3  HCT 30.5* 39.1 45.0   MCV 118.2* 123.3*  --   PLT 87* 326  --    Basic Metabolic Panel:  Recent Labs Lab 01/06/16 0501 01/27/2016 1350 01/03/2016 1437  NA 137 138 139  K 2.9* 4.0 3.9  CL 99* 103 101  CO2 31 26  --   GLUCOSE 241* 166* 158*  BUN 17 14 13   CREATININE 0.63 0.66 0.60*  CALCIUM 7.9* 8.7*  --   MG 1.9  --   --    GFR: Estimated Creatinine Clearance: 73.7 mL/min (by C-G formula based on SCr of 0.8 mg/dL). Liver Function Tests:  Recent Labs Lab 01/06/16 0501 01/13/2016 1350  AST 162* 218*  ALT 172* 325*  ALKPHOS 108 296*  BILITOT 5.0* 4.3*  PROT 4.9* 7.0  ALBUMIN 2.6* 3.2*   No results for input(s): LIPASE, AMYLASE in the last 168 hours. No results for input(s): AMMONIA in the last 168 hours. Coagulation Profile:  Recent Labs Lab 01/28/2016 1703  INR 1.24   Cardiac Enzymes:  Recent Labs Lab 01/21/2016 1703  TROPONINI 0.05*   BNP (last 3 results) No results for input(s): PROBNP in the last 8760 hours. HbA1C: No results for input(s): HGBA1C in the last 72 hours. CBG: No results for input(s): GLUCAP in the last 168 hours. Lipid Profile: No results for input(s): CHOL, HDL, LDLCALC, TRIG, CHOLHDL, LDLDIRECT in the last 72 hours. Thyroid Function Tests: No results for input(s): TSH, T4TOTAL, FREET4, T3FREE, THYROIDAB in the last 72 hours. Anemia Panel: No results for input(s): VITAMINB12, FOLATE, FERRITIN, TIBC, IRON, RETICCTPCT in the last 72 hours. Urine analysis:    Component Value Date/Time   COLORURINE AMBER (A) 05/18/2013 0830   APPEARANCEUR CLEAR 05/18/2013 0830   LABSPEC >1.046 (H) 05/18/2013 0830   PHURINE 5.5 05/18/2013 0830   GLUCOSEU NEGATIVE 05/18/2013 0830   HGBUR NEGATIVE 05/18/2013 0830   BILIRUBINUR NEGATIVE 05/18/2013 0830   KETONESUR NEGATIVE 05/18/2013 0830   PROTEINUR NEGATIVE 05/18/2013 0830   UROBILINOGEN 1.0 05/18/2013 0830   NITRITE NEGATIVE 05/18/2013 0830   LEUKOCYTESUR NEGATIVE 05/18/2013 0830   Sepsis  Labs: @LABRCNTIP (procalcitonin:4,lacticidven:4) )No results found for this or any previous visit (from the past 240 hour(s)).   Radiological Exams on Admission: Ct Angio Chest Pe W And/or Wo Contrast  Result Date: 01/16/2016 CLINICAL DATA:  Shortness of breath. Hypoxia. Previous pulmonary emboli. EXAM: CT ANGIOGRAPHY CHEST WITH CONTRAST TECHNIQUE: Multidetector CT imaging of the chest was performed using the standard protocol during bolus administration of intravenous contrast. Multiplanar CT image reconstructions and MIPs were obtained to evaluate the vascular anatomy. CONTRAST:  70 cc Isovue COMPARISON:  Chest x-ray dated 01/22/2016 and chest CT dated 08/03/2015 and CT angiogram dated 05/18/2013 FINDINGS: Mediastinum/Lymph Nodes: There is a prominent pulmonary embolus in the right main pulmonary artery measuring approximately 2.4 cm in diameter. There  are chronic pulmonary emboli extending into the right middle, right upper, and right lower lobes with small residual bands in some of the vessels. There are also chronic bandlike pulmonary emboli in the left pulmonary arteries. RV/LV ratio is 1.0. Overall heart size is normal. No adenopathy. Lungs/Pleura: Chronic obstructive lung disease with chronic peripheral interstitial disease at the lung bases with chronic bronchiectasis at both bases. New hazy infiltrate throughout the left lower lobe and less extensively in the left upper lobe. There is increased perfusion to the left lung as compared to the right and compared to the prior study of 08/03/2015. No effusions. Upper abdomen: Normal. Musculoskeletal: No chest wall mass or suspicious bone lesions identified. Review of the MIP images confirms the above findings. IMPRESSION: 1. Chronic bilateral pulmonary emboli. 2. 2.4 cm embolus in the right main pulmonary artery which I suspect represents a recurrent pulmonary embolus. There is new shunting of blood to the left lung with hazy infiltrate in the left lung  which I suspect represents pulmonary edema. 3. Bronchiectasis at the lung bases. 4. These abnormalities less likely could represent usual interstitial pneumonitis. I recommend a follow-up chest CT without contrast in 6 months for further evaluation. Critical Value/emergent results were called by telephone at the time of interpretation on 01/21/2016 at 3:24 pm to Dr. Addison Lank , who verbally acknowledged these results. . Electronically Signed   By: Lorriane Shire M.D.   On: 01/25/2016 15:24   Dg Chest Port 1 View  Result Date: 01/10/2016 CLINICAL DATA:  Left leg swelling, shortness of Breath EXAM: PORTABLE CHEST 1 VIEW COMPARISON:  01/01/2016 FINDINGS: Cardiomediastinal silhouette is stable. There is streaky left basilar atelectasis or infiltrate. No pulmonary edema. IMPRESSION: No pulmonary edema. Streaky left basilar atelectasis or early infiltrate. Electronically Signed   By: Lahoma Crocker M.D.   On: 01/25/2016 12:59    EKG: Independently reviewed. Sinus rhythm with PVCs  Assessment/Plan Principal Problem:   Pulmonary embolism  - heparin infusion - O2 - duplex b/l lower extremities  - prior h/o DVT was in right leg- at this time left leg is swollen  Active Problems:   Alcohol abuse - in remission    Acute alcoholic hepatitis  - cont Prednisone   DVT prophylaxis: Heparin  Code Status: DNR Family Communication:  Call brother Derone Moustafa- number in chart Disposition Plan: likely will have 2 day mimial stay  Consults called: none  Admission status: observation    [facility] MD [facility] Pager: [email or web address] Password [email or web address] [phone], please contact night-coverage   01/04/2016, 6:15 PM

## 2016-01-12 NOTE — Telephone Encounter (Signed)
Error

## 2016-01-12 NOTE — Progress Notes (Signed)
EDCM informed Karen of [facility] of patient's admission. 

## 2016-01-12 NOTE — Progress Notes (Signed)
ANTICOAGULATION CONSULT NOTE - Initial Consult  Pharmacy Consult for Heparin IV Indication: pulmonary embolus  No Known Allergies  Patient Measurements: Height: 5\' 5"  (165.1 cm) Weight: 140 lb (63.5 kg) IBW/kg (Calculated) : 61.5 Heparin Dosing Weight: 63.5  Vital Signs: Temp: 98.3 F (36.8 C) (08/10 1228) Temp Source: Oral (08/10 1228) BP: 151/99 (08/10 1509) Pulse Rate: 94 (08/10 1509)  Labs:  Recent Labs  01/07/2016 1350 01/29/2016 1437  HGB 13.4 15.3  HCT 39.1 45.0  PLT 326  --   CREATININE 0.66 0.60*    Estimated Creatinine Clearance: 73.7 mL/min (by C-G formula based on SCr of 0.8 mg/dL).   Medical History: Past Medical History:  Diagnosis Date  . Closed fracture of left wrist   . DVT   . ETOH abuse   . Hypokalemia   . Interstitial lung disease ([place])   . Pulmonary embolism     Medications:   (Not in a hospital admission) Scheduled:  . heparin  2,000 Units Intravenous Once    Assessment: 74 yoM with PMH EtOH abuse with cirrhosis, DVT/PE, presents 01/03/2016 with SOB and likely recurrent PE noted on CT chest. Recent admission 7/30-8/4 for acute alcoholic hepatitis.    Baseline INR, aPTT: elevated d/t chronic liver disease; last INR 2.03 on 8/1  Prior anticoagulation: none  Significant events:  Today, 01/13/2016:  CBC: previously with noted chronic thrombocytopenia, now with normal Hgb and Plt  No bleeding or infusion issues per nursing  CrCl: 74 ml/min  Goal of Therapy: Heparin level 0.3-0.7 units/ml Monitor platelets by anticoagulation protocol: Yes  Plan:  Heparin 2000 units IV bolus x 1  Heparin 1100 units/hr IV infusion  Check heparin level 8 hrs after start  Daily CBC, daily heparin level once stable  Monitor for signs of bleeding or thrombosis   Reuel Boom, PharmD Pager: [phone] 01/19/2016, 3:50 PM

## 2016-01-12 NOTE — ED Provider Notes (Signed)
[facility] DEPT Provider Note   CSN: [id number] Arrival date & time: 01/10/2016  1215  First Provider Contact:  First MD Initiated Contact with Patient 01/11/2016 1242        History   Chief Complaint Chief Complaint  Patient presents with  . Shortness of Breath    HPI Sean Jackson is a 70 y.o. male.  The history is provided by the patient.  Shortness of Breath  This is a new problem. The average episode lasts 2 days. The problem occurs continuously.The problem has been gradually worsening. Associated symptoms include leg swelling ( LLE). Pertinent negatives include no fever, no headaches, no coryza, no rhinorrhea, no sore throat, no ear pain, no cough, no sputum production, no hemoptysis, no wheezing, no chest pain, no vomiting, no abdominal pain, no rash and no leg pain. Precipitated by: exersion. He has tried nothing for the symptoms. Associated medical issues include PE and DVT. Associated medical issues do not include asthma, COPD, pneumonia, chronic lung disease, heart failure or recent surgery.    Past Medical History:  Diagnosis Date  . Closed fracture of left wrist   . DVT   . ETOH abuse   . Hypokalemia   . Interstitial lung disease ([place])   . Pulmonary embolism     Patient Active Problem List   Diagnosis Date Noted  . Pulmonary embolism ([place]) 01/21/2016  . Acute alcoholic hepatitis 87/68/1157  . SIRS (systemic inflammatory response syndrome) ([place]) 01/01/2016  . Hyperbilirubinemia 01/01/2016  . Transaminitis 01/01/2016  . Pain   . Emesis, persistent   . AKI (acute kidney injury) ([place])   . Increased anion gap metabolic acidosis   . Interstitial lung disease ([place]) 10/07/2015  . Dyspnea 05/18/2013  . Hypoxemia 05/18/2013  . Alcohol abuse 05/18/2013  . Lung nodules 04/06/2013  . Hypokalemia 03/14/2013  . [place]addle pulmonary embolus ([place]) 03/10/2013  . Right leg DVT ([place]) 03/10/2013    Past Surgical History:  Procedure Laterality Date  . ANKLE FRACTURE  SURGERY    . LEFT HAND SURGERY    . RIGHT HEART CATHETERIZATION N/A 05/19/2013   Procedure: RIGHT HEART CATH;  Surgeon: Jolaine Artist, MD;  Location: [facility] CATH LAB;  Service: Cardiovascular;  Laterality: N/A;       Home Medications    Prior to Admission medications   Medication Sig Start Date End Date Taking? Authorizing Provider  magnesium oxide (MAG-OX) 400 (241.3 Mg) MG tablet Take 0.5 tablets (200 mg total) by mouth daily. 01/06/16  Yes Theodis Blaze, MD  Multiple Vitamins-Minerals (MULTIVITAMIN WITH MINERALS) tablet Take 1 tablet by mouth daily.    Yes Historical Provider, MD  ondansetron (ZOFRAN) 4 MG tablet Take 1 tablet (4 mg total) by mouth every 6 (six) hours as needed for nausea. 01/06/16  Yes Theodis Blaze, MD  pantoprazole (PROTONIX) 40 MG tablet Take 1 tablet (40 mg total) by mouth 2 (two) times daily. 01/06/16  Yes Theodis Blaze, MD  potassium chloride SA (K-DUR,KLOR-CON) 20 MEQ tablet Take 1 tablet (20 mEq total) by mouth daily. 01/06/16  Yes Theodis Blaze, MD  predniSONE (DELTASONE) 20 MG tablet Take 1 tablet (20 mg total) by mouth 2 (two) times daily with a meal. 01/06/16  Yes Theodis Blaze, MD    Family History Family History  Problem Relation Age of Onset  . Heart attack Father 39    Social History Social History  Substance Use Topics  . Smoking status: Never Smoker  . Smokeless tobacco:  Never Used  . Alcohol use 0.5 oz/week    1 Standard drinks or equivalent per week     Comment: rare     Allergies   Review of patient's allergies indicates no known allergies.   Review of Systems Review of Systems  Constitutional: Negative for appetite change, chills, fatigue and fever.  HENT: Negative for congestion, ear pain, facial swelling, mouth sores, rhinorrhea and sore throat.   Eyes: Negative for visual disturbance.  Respiratory: Positive for shortness of breath. Negative for cough, hemoptysis, sputum production, chest tightness and wheezing.   Cardiovascular:  Positive for leg swelling ( LLE). Negative for chest pain and palpitations.  Gastrointestinal: Negative for abdominal pain, blood in stool, diarrhea, nausea and vomiting.  Endocrine: Negative for cold intolerance and heat intolerance.  Genitourinary: Negative for decreased urine volume, difficulty urinating and frequency.  Musculoskeletal: Negative for back pain and neck stiffness.  Skin: Negative for rash.  Neurological: Negative for dizziness, weakness, light-headedness and headaches.  All other systems reviewed and are negative.    Physical Exam Updated Vital Signs BP 135/87 (BP Location: Right Arm)   Pulse 81   Temp 98.7 F (37.1 C) (Oral)   Resp (!) 30   Ht 5' 5"  (1.651 m)   Wt 140 lb (63.5 kg)   SpO2 68%   BMI 23.30 kg/m   Physical Exam  Constitutional: He is oriented to person, place, and time. He appears well-nourished. No distress.  HENT:  Head: Normocephalic and atraumatic.  Right Ear: External ear normal.  Left Ear: External ear normal.  Eyes: Pupils are equal, round, and reactive to light. Right eye exhibits no discharge. Left eye exhibits no discharge. No scleral icterus.  Neck: Normal range of motion. Neck supple.  Cardiovascular: Normal rate.  Exam reveals no gallop and no friction rub.   No murmur heard. Pulmonary/Chest: Effort normal and breath sounds normal. No stridor. No respiratory distress. He has no wheezes. He has no rales. He exhibits no tenderness.  Abdominal: Soft. He exhibits no distension and no mass. There is no tenderness. There is no rebound and no guarding.  Musculoskeletal: He exhibits no edema or tenderness.  LLE 2+ pitting edema  Neurological: He is alert and oriented to person, place, and time.  Skin: Skin is warm and dry. No rash noted. He is not diaphoretic. No erythema.     ED Treatments / Results  Labs (all labs ordered are listed, but only abnormal results are displayed) Labs Reviewed  CBC WITH DIFFERENTIAL/PLATELET - Abnormal;  Notable for the following:       Result Value   WBC 25.2 (*)    RBC 3.17 (*)    MCV 123.3 (*)    MCH 42.3 (*)    RDW 18.8 (*)    Neutro Abs 23.4 (*)    All other components within normal limits  COMPREHENSIVE METABOLIC PANEL - Abnormal; Notable for the following:    Glucose, Bld 166 (*)    Calcium 8.7 (*)    Albumin 3.2 (*)    AST 218 (*)    ALT 325 (*)    Alkaline Phosphatase 296 (*)    Total Bilirubin 4.3 (*)    All other components within normal limits  BLOOD GAS, ARTERIAL - Abnormal; Notable for the following:    pH, Arterial 7.522 (*)    pCO2 arterial 29.6 (*)    Bicarbonate 24.1 (*)    Acid-Base Excess 2.3 (*)    All other components within normal limits  BRAIN NATRIURETIC PEPTIDE - Abnormal; Notable for the following:    B Natriuretic Peptide 579.7 (*)    All other components within normal limits  TROPONIN I - Abnormal; Notable for the following:    Troponin I 0.05 (*)    All other components within normal limits  PROTIME-INR - Abnormal; Notable for the following:    Prothrombin Time 15.6 (*)    All other components within normal limits  APTT - Abnormal; Notable for the following:    aPTT 57 (*)    All other components within normal limits  CBC - Abnormal; Notable for the following:    WBC 23.7 (*)    RBC 2.97 (*)    Hemoglobin 12.5 (*)    HCT 36.5 (*)    MCV 122.9 (*)    MCH 42.1 (*)    RDW 18.9 (*)    All other components within normal limits  DIFFERENTIAL - Abnormal; Notable for the following:    Neutro Abs 20.8 (*)    Monocytes Absolute 1.2 (*)    All other components within normal limits  I-STAT CHEM 8, ED - Abnormal; Notable for the following:    Creatinine, Ser 0.60 (*)    Glucose, Bld 158 (*)    All other components within normal limits  MRSA PCR SCREENING  HEPARIN LEVEL (UNFRACTIONATED)  CBC WITH DIFFERENTIAL/PLATELET  CBC  BASIC METABOLIC PANEL    EKG  EKG Interpretation None       Radiology Ct Angio Chest Pe W And/or Wo  Contrast  Result Date: 01/11/2016 CLINICAL DATA:  Shortness of breath. Hypoxia. Previous pulmonary emboli. EXAM: CT ANGIOGRAPHY CHEST WITH CONTRAST TECHNIQUE: Multidetector CT imaging of the chest was performed using the standard protocol during bolus administration of intravenous contrast. Multiplanar CT image reconstructions and MIPs were obtained to evaluate the vascular anatomy. CONTRAST:  70 cc Isovue COMPARISON:  Chest x-ray dated 01/04/2016 and chest CT dated 08/03/2015 and CT angiogram dated 05/18/2013 FINDINGS: Mediastinum/Lymph Nodes: There is a prominent pulmonary embolus in the right main pulmonary artery measuring approximately 2.4 cm in diameter. There are chronic pulmonary emboli extending into the right middle, right upper, and right lower lobes with small residual bands in some of the vessels. There are also chronic bandlike pulmonary emboli in the left pulmonary arteries. RV/LV ratio is 1.0. Overall heart size is normal. No adenopathy. Lungs/Pleura: Chronic obstructive lung disease with chronic peripheral interstitial disease at the lung bases with chronic bronchiectasis at both bases. New hazy infiltrate throughout the left lower lobe and less extensively in the left upper lobe. There is increased perfusion to the left lung as compared to the right and compared to the prior study of 08/03/2015. No effusions. Upper abdomen: Normal. Musculoskeletal: No chest wall mass or suspicious bone lesions identified. Review of the MIP images confirms the above findings. IMPRESSION: 1. Chronic bilateral pulmonary emboli. 2. 2.4 cm embolus in the right main pulmonary artery which I suspect represents a recurrent pulmonary embolus. There is new shunting of blood to the left lung with hazy infiltrate in the left lung which I suspect represents pulmonary edema. 3. Bronchiectasis at the lung bases. 4. These abnormalities less likely could represent usual interstitial pneumonitis. I recommend a follow-up chest CT  without contrast in 6 months for further evaluation. Critical Value/emergent results were called by telephone at the time of interpretation on 01/27/2016 at 3:24 pm to Dr. Addison Lank , who verbally acknowledged these results. . Electronically Signed   By: Jeneen Rinks  Maxwell M.D.   On: 01/05/2016 15:24   Dg Chest Port 1 View  Result Date: 01/08/2016 CLINICAL DATA:  Left leg swelling, shortness of Breath EXAM: PORTABLE CHEST 1 VIEW COMPARISON:  01/01/2016 FINDINGS: Cardiomediastinal silhouette is stable. There is streaky left basilar atelectasis or infiltrate. No pulmonary edema. IMPRESSION: No pulmonary edema. Streaky left basilar atelectasis or early infiltrate. Electronically Signed   By: Lahoma Crocker M.D.   On: 01/11/2016 12:59    Procedures Procedures (including critical care time)  Medications Ordered in ED Medications  heparin ADULT infusion 100 units/mL (25000 units/263m sodium chloride 0.45%) (1,100 Units/hr Intravenous Rate/Dose Verify 01/16/2016 1900)  pantoprazole (PROTONIX) EC tablet 40 mg (40 mg Oral Given 01/27/2016 2135)  potassium chloride SA (K-DUR,KLOR-CON) CR tablet 20 mEq (20 mEq Oral Given 01/20/2016 2022)  predniSONE (DELTASONE) tablet 20 mg (20 mg Oral Given 01/17/2016 2022)  ondansetron (ZOFRAN) tablet 4 mg (not administered)  multivitamin with minerals tablet 1 tablet (not administered)  magnesium oxide (MAG-OX) tablet 200 mg (not administered)  acetaminophen (TYLENOL) tablet 650 mg (not administered)    Or  acetaminophen (TYLENOL) suppository 650 mg (not administered)  sodium chloride flush (NS) 0.9 % injection 3 mL (3 mLs Intravenous Given 01/11/2016 2137)  sodium chloride flush (NS) 0.9 % injection 3 mL (not administered)  0.9 %  sodium chloride infusion (250 mLs Intravenous New Bag/Given 01/10/2016 2031)  iopamidol (ISOVUE-370) 76 % injection 100 mL (70 mLs Intravenous Contrast Given 01/29/2016 1436)  heparin bolus via infusion 2,000 Units (2,000 Units Intravenous Bolus from Bag 01/21/2016  1645)     Initial Impression / Assessment and Plan / ED Course  I have reviewed the triage vital signs and the nursing notes.  Pertinent labs & imaging results that were available during my care of the patient were reviewed by me and considered in my medical decision making (see chart for details).  Clinical Course    Pt w/ h/o of prior PE here with hypoxia improved on NRB. Presentation highly concerning for left leg DVT with subsequent PE. CXR with left lobe edema; no PNA or PTX. CTA chest with large acute PE; confirmed left lobe edema. ABG with respiratory alkalosis. Labs with leukocytosis, elevated trop and BNP. Also with transaminitis concerning for cor pulmonale.  Pt remained HDS with stable sats on NRB. Weaning.   Spoke with Pulmonary team to see if pt met criteria for lytics. Not a candidate at this time.   Started on Heparin gtt. Admitted to step down for further management.  CRITICAL CARE Performed by: PGrayce SessionsCardama Total critical care time: 32 minutes Critical care time was exclusive of separately billable procedures and treating other patients. Critical care was necessary to treat or prevent imminent or life-threatening deterioration. Critical care was time spent personally by me on the following activities: development of treatment plan with patient and/or surrogate as well as nursing, discussions with consultants, evaluation of patient's response to treatment, examination of patient, obtaining history from patient or surrogate, ordering and performing treatments and interventions, ordering and review of laboratory studies, ordering and review of radiographic studies, pulse oximetry and re-evaluation of patient's condition.   Final Clinical Impressions(s) / ED Diagnoses   Final diagnoses:  SOB (shortness of breath)  Other acute pulmonary embolism with acute cor pulmonale (HCC)  Acute pulmonary edema (HCC)  Hypoxia    Disposition: Admit  Condition: serious,  but stable    PFatima Blank MD 01/24/2016 2217

## 2016-01-12 NOTE — ED Triage Notes (Signed)
Per EMS- patient went to his PCP for c/o SOB x 3 days. Initial sats at physician's office was in the 60's. Patient was placed on O2 6L/min via [place] and sats increased to 87%. EKG-NSR. Patient has history of PE's.

## 2016-01-12 NOTE — Progress Notes (Signed)
Patient noted to have recent admission from 07/30 to 08/04 for acute alcoholic hepatitis.  EDCM spoke to patient at bedside.  Patient reports he lives at home with his brother.  He reports his brother helps him at home at times when he can.  Patient confirms he was seen by his pcp today.  He reports initial PT visit by [facility] was on Tuesday.  Patient reports he hasn't really been moving around a lot at home because of feeling weak.  Patient reports he has a walker and a shower chair at home.  Patient noted to be wearing non rebreather in ED.  Patient reports he does not wear oxygen at home.  Patient reports he does not take blood thinners or aspirin at home.

## 2016-01-13 ENCOUNTER — Inpatient Hospital Stay (HOSPITAL_COMMUNITY): Payer: Medicare Other

## 2016-01-13 ENCOUNTER — Ambulatory Visit: Payer: Medicare Other | Admitting: Emergency Medicine

## 2016-01-13 DIAGNOSIS — J849 Interstitial pulmonary disease, unspecified: Secondary | ICD-10-CM

## 2016-01-13 DIAGNOSIS — J9601 Acute respiratory failure with hypoxia: Secondary | ICD-10-CM

## 2016-01-13 DIAGNOSIS — I2699 Other pulmonary embolism without acute cor pulmonale: Secondary | ICD-10-CM

## 2016-01-13 DIAGNOSIS — M7989 Other specified soft tissue disorders: Secondary | ICD-10-CM

## 2016-01-13 DIAGNOSIS — R9431 Abnormal electrocardiogram [ECG] [EKG]: Secondary | ICD-10-CM

## 2016-01-13 LAB — ECHOCARDIOGRAM COMPLETE
CHL CUP MV DEC (S): 211
CHL CUP TV REG PEAK VELOCITY: 297 cm/s
E decel time: 211 msec
E/e' ratio: 5.54
FS: 27 % — AB (ref 28–44)
HEIGHTINCHES: 65 in
IV/PV OW: 1.07
LA diam end sys: 31 mm
LADIAMINDEX: 1.79 cm/m2
LASIZE: 31 mm
LAVOL: 35.7 mL
LAVOLA4C: 31.5 mL
LAVOLIN: 20.6 mL/m2
LV E/e' medial: 5.54
LV PW d: 10 mm — AB (ref 0.6–1.1)
LVEEAVG: 5.54
LVELAT: 9.9 cm/s
LVOT area: 3.14 cm2
LVOT diameter: 20 mm
MV pk E vel: 54.8 m/s
MVPKAVEL: 80.5 m/s
TAPSE: 22.8 mm
TDI e' lateral: 9.9
TDI e' medial: 6.96
TRMAXVEL: 297 cm/s
WEIGHTICAEL: 2342.17 [oz_av]

## 2016-01-13 LAB — HEPARIN LEVEL (UNFRACTIONATED)
Heparin Unfractionated: 0.72 IU/mL — ABNORMAL HIGH (ref 0.30–0.70)
Heparin Unfractionated: 0.47 IU/mL (ref 0.30–0.70)

## 2016-01-13 LAB — CBC
HCT: 34.2 % — ABNORMAL LOW (ref 39.0–52.0)
Hemoglobin: 11.8 g/dL — ABNORMAL LOW (ref 13.0–17.0)
MCH: 42 pg — ABNORMAL HIGH (ref 26.0–34.0)
MCHC: 34.5 g/dL (ref 30.0–36.0)
MCV: 121.7 fL — ABNORMAL HIGH (ref 78.0–100.0)
PLATELETS: 314 10*3/uL (ref 150–400)
RBC: 2.81 MIL/uL — AB (ref 4.22–5.81)
RDW: 18.9 % — AB (ref 11.5–15.5)
WBC: 18.5 10*3/uL — AB (ref 4.0–10.5)

## 2016-01-13 LAB — BASIC METABOLIC PANEL
ANION GAP: 7 (ref 5–15)
BUN: 14 mg/dL (ref 6–20)
CHLORIDE: 104 mmol/L (ref 101–111)
CO2: 24 mmol/L (ref 22–32)
CREATININE: 0.62 mg/dL (ref 0.61–1.24)
Calcium: 8.1 mg/dL — ABNORMAL LOW (ref 8.9–10.3)
GFR calc non Af Amer: >60 mL/min (ref >60)
GLUCOSE: 167 mg/dL — AB (ref 65–99)
Potassium: 4.7 mmol/L (ref 3.5–5.1)
Sodium: 135 mmol/L (ref 135–145)

## 2016-01-13 MED ORDER — PERFLUTREN LIPID MICROSPHERE
1.0000 mL | INTRAVENOUS | Status: AC | PRN
  Administered 2016-01-13: 2 mL via INTRAVENOUS
  Filled 2016-01-13: qty 10

## 2016-01-13 MED ORDER — HEPARIN BOLUS VIA INFUSION
2000.0000 [IU] | Freq: Once | INTRAVENOUS | Status: AC
  Administered 2016-01-13: 2000 [IU] via INTRAVENOUS
  Filled 2016-01-13: qty 2000

## 2016-01-13 MED ORDER — PERFLUTREN LIPID MICROSPHERE
INTRAVENOUS | Status: AC
  Filled 2016-01-13: qty 10

## 2016-01-13 MED ORDER — HEPARIN (PORCINE) IN NACL 100-0.45 UNIT/ML-% IJ SOLN
1050.0000 [IU]/h | INTRAMUSCULAR | Status: DC
  Administered 2016-01-14 – 2016-01-16 (×3): 1050 [IU]/h via INTRAVENOUS
  Filled 2016-01-13 (×6): qty 250

## 2016-01-13 NOTE — Progress Notes (Addendum)
Echocardiogram 2D Echocardiogram with contrast has been performed.  Sean Jackson 01/13/2016, 2:24 PM

## 2016-01-13 NOTE — Progress Notes (Signed)
ANTICOAGULATION CONSULT NOTE  Pharmacy Consult for Heparin IV Indication: pulmonary embolus  No Known Allergies  Patient Measurements: Height: 5\' 5"  (165.1 cm) Weight: 146 lb 6.2 oz (66.4 kg) IBW/kg (Calculated) : 61.5 Heparin Dosing Weight: 63.5  Vital Signs: Temp: 98.2 F (36.8 C) (08/11 1600) Temp Source: Oral (08/11 1600)  Labs:  Recent Labs  01/11/2016 1350 01/03/2016 1437 01/18/2016 1703 01/19/2016 1817 02/02/2016 2354 01/13/16 0315 01/13/16 0747 01/13/16 1820  HGB 13.4 15.3  --  12.5*  --  11.8*  --   --   HCT 39.1 45.0  --  36.5*  --  34.2*  --   --   PLT 326  --   --  327  --  314  --   --   APTT  --   --  57*  --   --   --   --   --   LABPROT  --   --  15.6*  --   --   --   --   --   INR  --   --  1.24  --   --   --   --   --   HEPARINUNFRC  --   --   --   --  <0.10*  --  0.72* 0.47  CREATININE 0.66 0.60*  --   --   --  0.62  --   --   TROPONINI  --   --  0.05*  --   --   --   --   --     Estimated Creatinine Clearance: 73.7 mL/min (by C-G formula based on SCr of 0.8 mg/dL).   Medical History: Past Medical History:  Diagnosis Date  . Closed fracture of left wrist   . DVT   . ETOH abuse   . Hypokalemia   . Interstitial lung disease ([place])   . Pulmonary embolism     Assessment: 44 yoM with PMH EtOH abuse with cirrhosis, DVT/PE, presents 01/09/2016 with SOB and likely recurrent PE noted on CT chest. Recent admission 7/30-8/4 for acute alcoholic hepatitis.    Baseline INR = 1.24, aPTT = 57sec: elevated d/t chronic liver disease; last INR 2.03 on 8/1  Prior anticoagulation: none  Significant events: 8/10 23:54 HL < 0.1 but heparin off d/t PIV infiltration and new line placed  Today, 01/13/16:  Heparin level @ 1820 = 0.47, now therapeutic on heparin infusion at 1050 units/hr  CBC: Hgb down from admission (does not appear to have received a lot of IVF so doubt dilutional). Previously with noted chronic thrombocytopenia, but Pltc currently WNL  No bleeding  or infusion issues per nursing  Goal of Therapy: Heparin level 0.3-0.7 units/ml Monitor platelets by anticoagulation protocol: Yes  Plan:  Continue heparin infusion at 1050 units/hr   Check heparin level 8h after last level  Daily CBC, heparin level while on heparin infusion  Monitor for s/sx of bleeding   Lindell Spar, PharmD, BCPS Pager: [phone] 01/13/2016 7:07 PM

## 2016-01-13 NOTE — Progress Notes (Addendum)
PROGRESS NOTE    Sean Jackson  [id number] DOB: 07/04/1945 DOA: 01/14/2016  PCP: Aretta Nip, MD   Brief Narrative:  Sean Jackson is a 70 y.o. male with medical history significant of alcohol abuse with recent admission for alcoholic hepatitis discharged on 8/4 on steroid treatment, cirrhosis, PE in the past (Xarelto in 2014) and anemia who presents for dyspnea x 3-4 days. Found to be hypoxic in the ER. CT chest shows acute PE in right main pulmonary artery .  Subjective: No dyspnea at rest. No chest pain. No cough, nausea or vomiting.   Assessment & Plan:   Principal Problem:   Pulmonary embolism  - 3 occurrence of thrombus - unprovoked saddle PE in 2014 with right leg DVT- h/o left leg DVT 5 yrs prior to this- he was on Xarelto which was discontinued on 9/15 as no genetic predisposition/ hypercoagulable state was found - he admits that he had been more sendentary over the past few weeks - f/u venous duplex- left leg is swollen - switch to Eliquis hopefully tomorrow - still quite hypoxic and requiring non-rebreather- has underlying lung disease with fibrosis and bronchiectasis and persistent chronic PEs which may be contributing to the hypoxia   Active Problems: Interstitial lung disease - has been evaluated by pulmonary recently and is being followed- not hypoxic at baseline  Moderate pulmonary HTN - due to lung disease and PEs in the past    Alcohol abuse - in remission    Acute alcoholic hepatitis - Improving - cont prednisone  Leukocytosis  - due to steroids- no infection found at this time   DVT prophylaxis: Heparin Code Status: DNR Family Communication:  Disposition Plan: home when stable Consultants:   none Procedures:   none Antimicrobials:  Anti-infectives    None       Objective: Vitals:   01/13/16 0428 01/13/16 0600 01/13/16 0800 01/13/16 1200  BP:  125/68    Pulse:  (!) 57    Resp:  (!) 26    Temp:   97.8 F (36.6 C) 98.3 F  (36.8 C)  TempSrc:   Oral Oral  SpO2:  100%    Weight: 66.4 kg (146 lb 6.2 oz)     Height:        Intake/Output Summary (Last 24 hours) at 01/13/16 1404 Last data filed at 01/13/16 1300  Gross per 24 hour  Intake           844.75 ml  Output             1950 ml  Net         -1105.25 ml   Filed Weights   01/11/2016 1230 01/13/16 0428  Weight: 63.5 kg (140 lb) 66.4 kg (146 lb 6.2 oz)    Examination: General exam: Appears comfortable  HEENT: PERRLA, oral mucosa moist, no sclera icterus or thrush Respiratory system: Clear to auscultation. Respiratory effort normal.- pulse ox 95% on 100% non-rebreather Cardiovascular system: S1 & S2 heard, RRR.  No murmurs  Gastrointestinal system: Abdomen soft, non-tender, nondistended. Normal bowel sound. No organomegaly Central nervous system: Alert and oriented. No focal neurological deficits. Extremities: No cyanosis, clubbing - left leg edema Skin: No rashes or ulcers Psychiatry:  Mood & affect appropriate.     Data Reviewed: I have personally reviewed following labs and imaging studies  CBC:  Recent Labs Lab 01/23/2016 1350 01/15/2016 1437 01/04/2016 1817 01/13/16 0315  WBC 25.2*  --  23.7* 18.5*  NEUTROABS 23.4*  --  20.8*  --   HGB 13.4 15.3 12.5* 11.8*  HCT 39.1 45.0 36.5* 34.2*  MCV 123.3*  --  122.9* 121.7*  PLT 326  --  327 [id number]   Basic Metabolic Panel:  Recent Labs Lab 01/23/2016 1350 01/19/2016 1437 01/13/16 0315  NA 138 139 135  K 4.0 3.9 4.7  CL 103 101 104  CO2 26  --  24  GLUCOSE 166* 158* 167*  BUN 14 13 14   CREATININE 0.66 0.60* 0.62  CALCIUM 8.7*  --  8.1*   GFR: Estimated Creatinine Clearance: 73.7 mL/min (by C-G formula based on SCr of 0.8 mg/dL). Liver Function Tests:  Recent Labs Lab 01/10/2016 1350  AST 218*  ALT 325*  ALKPHOS 296*  BILITOT 4.3*  PROT 7.0  ALBUMIN 3.2*   No results for input(s): LIPASE, AMYLASE in the last 168 hours. No results for input(s): AMMONIA in the last 168  hours. Coagulation Profile:  Recent Labs Lab 01/22/2016 1703  INR 1.24   Cardiac Enzymes:  Recent Labs Lab 01/10/2016 1703  TROPONINI 0.05*   BNP (last 3 results) No results for input(s): PROBNP in the last 8760 hours. HbA1C: No results for input(s): HGBA1C in the last 72 hours. CBG: No results for input(s): GLUCAP in the last 168 hours. Lipid Profile: No results for input(s): CHOL, HDL, LDLCALC, TRIG, CHOLHDL, LDLDIRECT in the last 72 hours. Thyroid Function Tests: No results for input(s): TSH, T4TOTAL, FREET4, T3FREE, THYROIDAB in the last 72 hours. Anemia Panel: No results for input(s): VITAMINB12, FOLATE, FERRITIN, TIBC, IRON, RETICCTPCT in the last 72 hours. Urine analysis:    Component Value Date/Time   COLORURINE AMBER (A) 05/18/2013 0830   APPEARANCEUR CLEAR 05/18/2013 0830   LABSPEC >1.046 (H) 05/18/2013 0830   PHURINE 5.5 05/18/2013 0830   GLUCOSEU NEGATIVE 05/18/2013 0830   HGBUR NEGATIVE 05/18/2013 0830   BILIRUBINUR NEGATIVE 05/18/2013 0830   KETONESUR NEGATIVE 05/18/2013 0830   PROTEINUR NEGATIVE 05/18/2013 0830   UROBILINOGEN 1.0 05/18/2013 0830   NITRITE NEGATIVE 05/18/2013 0830   LEUKOCYTESUR NEGATIVE 05/18/2013 0830   Sepsis Labs: @LABRCNTIP (procalcitonin:4,lacticidven:4) )No results found for this or any previous visit (from the past 240 hour(s)).       Radiology Studies: Ct Angio Chest Pe W And/or Wo Contrast  Result Date: 01/29/2016 CLINICAL DATA:  Shortness of breath. Hypoxia. Previous pulmonary emboli. EXAM: CT ANGIOGRAPHY CHEST WITH CONTRAST TECHNIQUE: Multidetector CT imaging of the chest was performed using the standard protocol during bolus administration of intravenous contrast. Multiplanar CT image reconstructions and MIPs were obtained to evaluate the vascular anatomy. CONTRAST:  70 cc Isovue COMPARISON:  Chest x-ray dated 01/07/2016 and chest CT dated 08/03/2015 and CT angiogram dated 05/18/2013 FINDINGS: Mediastinum/Lymph Nodes: There  is a prominent pulmonary embolus in the right main pulmonary artery measuring approximately 2.4 cm in diameter. There are chronic pulmonary emboli extending into the right middle, right upper, and right lower lobes with small residual bands in some of the vessels. There are also chronic bandlike pulmonary emboli in the left pulmonary arteries. RV/LV ratio is 1.0. Overall heart size is normal. No adenopathy. Lungs/Pleura: Chronic obstructive lung disease with chronic peripheral interstitial disease at the lung bases with chronic bronchiectasis at both bases. New hazy infiltrate throughout the left lower lobe and less extensively in the left upper lobe. There is increased perfusion to the left lung as compared to the right and compared to the prior study of 08/03/2015. No effusions. Upper abdomen: Normal. Musculoskeletal: No chest wall mass or  suspicious bone lesions identified. Review of the MIP images confirms the above findings. IMPRESSION: 1. Chronic bilateral pulmonary emboli. 2. 2.4 cm embolus in the right main pulmonary artery which I suspect represents a recurrent pulmonary embolus. There is new shunting of blood to the left lung with hazy infiltrate in the left lung which I suspect represents pulmonary edema. 3. Bronchiectasis at the lung bases. 4. These abnormalities less likely could represent usual interstitial pneumonitis. I recommend a follow-up chest CT without contrast in 6 months for further evaluation. Critical Value/emergent results were called by telephone at the time of interpretation on 01/17/2016 at 3:24 pm to Dr. Addison Lank , who verbally acknowledged these results. . Electronically Signed   By: Lorriane Shire M.D.   On: 01/20/2016 15:24   Dg Chest Port 1 View  Result Date: 01/29/2016 CLINICAL DATA:  Left leg swelling, shortness of Breath EXAM: PORTABLE CHEST 1 VIEW COMPARISON:  01/01/2016 FINDINGS: Cardiomediastinal silhouette is stable. There is streaky left basilar atelectasis or  infiltrate. No pulmonary edema. IMPRESSION: No pulmonary edema. Streaky left basilar atelectasis or early infiltrate. Electronically Signed   By: Lahoma Crocker M.D.   On: 01/18/2016 12:59      Scheduled Meds: . magnesium oxide  200 mg Oral Daily  . multivitamin with minerals  1 tablet Oral Daily  . pantoprazole  40 mg Oral BID  . potassium chloride SA  20 mEq Oral Daily  . predniSONE  20 mg Oral BID WC  . sodium chloride flush  3 mL Intravenous Q12H   Continuous Infusions: . heparin 1,050 Units/hr (01/13/16 0930)     LOS: 1 day    Time spent in minutes: 19    Hunt, MD [facility] Pager: [email or web address] Password [email or web address] 01/13/2016, 2:04 PM

## 2016-01-13 NOTE — Progress Notes (Signed)
ANTICOAGULATION CONSULT NOTE  Pharmacy Consult for Heparin IV Indication: pulmonary embolus  No Known Allergies  Patient Measurements: Height: 5\' 5"  (165.1 cm) Weight: 146 lb 6.2 oz (66.4 kg) IBW/kg (Calculated) : 61.5 Heparin Dosing Weight: 63.5  Vital Signs: Temp: 97.8 F (36.6 C) (08/11 0800) Temp Source: Oral (08/11 0800) BP: 125/68 (08/11 0600) Pulse Rate: 57 (08/11 0600)  Labs:  Recent Labs  02/01/2016 1350 01/10/2016 1437 01/22/2016 1703 01/16/2016 1817 01/20/2016 2354 01/13/16 0315 01/13/16 0747  HGB 13.4 15.3  --  12.5*  --  11.8*  --   HCT 39.1 45.0  --  36.5*  --  34.2*  --   PLT 326  --   --  327  --  314  --   APTT  --   --  57*  --   --   --   --   LABPROT  --   --  15.6*  --   --   --   --   INR  --   --  1.24  --   --   --   --   HEPARINUNFRC  --   --   --   --  <0.10*  --  0.72*  CREATININE 0.66 0.60*  --   --   --  0.Sean  --   TROPONINI  --   --  0.05*  --   --   --   --     Estimated Creatinine Clearance: 73.7 mL/min (by C-G formula based on SCr of 0.8 mg/dL).   Medical History: Past Medical History:  Diagnosis Date  . Closed fracture of left wrist   . DVT   . ETOH abuse   . Hypokalemia   . Interstitial lung disease ([place])   . Pulmonary embolism     Medications:  Prescriptions Prior to Admission  Medication Sig Dispense Refill Last Dose  . magnesium oxide (MAG-OX) 400 (241.3 Mg) MG tablet Take 0.5 tablets (200 mg total) by mouth daily. 30 tablet 0 01/16/2016 at Unknown time  . Multiple Vitamins-Minerals (MULTIVITAMIN WITH MINERALS) tablet Take 1 tablet by mouth daily.    01/10/2016 at Unknown time  . ondansetron (ZOFRAN) 4 MG tablet Take 1 tablet (4 mg total) by mouth every 6 (six) hours as needed for nausea. 20 tablet 0 Past Month at Unknown time  . pantoprazole (PROTONIX) 40 MG tablet Take 1 tablet (40 mg total) by mouth 2 (two) times daily. 60 tablet 0 01/11/2016 at Unknown time  . potassium chloride SA (K-DUR,KLOR-CON) 20 MEQ tablet Take 1 tablet  (20 mEq total) by mouth daily. 7 tablet 0 01/10/2016 at Unknown time  . predniSONE (DELTASONE) 20 MG tablet Take 1 tablet (20 mg total) by mouth 2 (two) times daily with a meal. 60 tablet 0 01/16/2016 at Unknown time   Scheduled:  . magnesium oxide  200 mg Oral Daily  . multivitamin with minerals  1 tablet Oral Daily  . pantoprazole  40 mg Oral BID  . potassium chloride SA  20 mEq Oral Daily  . predniSONE  20 mg Oral BID WC  . sodium chloride flush  3 mL Intravenous Q12H    Assessment: Sean Jackson with PMH EtOH abuse with cirrhosis, DVT/PE, presents 01/06/2016 with SOB and likely recurrent PE noted on CT chest. Recent admission 7/30-8/4 for acute alcoholic hepatitis.    Baseline INR = 1.24, aPTT = 57sec: elevated d/t chronic liver disease; last INR 2.03 on 8/1  Prior anticoagulation: none  Significant events: 8/10 23:54 HL < 0.1 but heparin off d/t PIV infiltration and new line placed  8/10  Heparin level supratherapeutic at 0.72 - possible bolus may be having some effect of level  CBC: Hgb down from admission (does not appear to have received a lot of IVF so doubt dilutional). Previously with noted chronic thrombocytopenia, but currently WNL  No bleeding or infusion issues per nursing  Goal of Therapy: Heparin level 0.3-0.7 units/ml Monitor platelets by anticoagulation protocol: Yes  Plan:  Due to fact heparin bolus from 1am may be effecting latest heparin level, make modest decrease in heparin drip to 1050 units/hr   Check heparin level 8 hrs after rate change  Daily CBC, daily heparin level once stable  Monitor for signs of bleeding or thrombosis  Doreene Eland, PharmD, BCPS.   Pager: [phone] 01/13/2016 8:42 AM

## 2016-01-13 NOTE — Progress Notes (Signed)
VASCULAR LAB PRELIMINARY  PRELIMINARY  PRELIMINARY  PRELIMINARY  Bilateral lower extremity venous duplex has been  completed.      Left: DVT noted in the common femoral vein, Prominal and distal femoral .  Right:  DVT noted in the Popliteal vein and the posterior tibial vein.   Bilateral: No evidence of superficial thrombosis.  No Baker's cyst.  Gave results to patient's nurse.   Janifer Adie, RVT, RDMS 01/13/2016, 4:28 PM

## 2016-01-13 NOTE — Progress Notes (Signed)
ANTICOAGULATION CONSULT NOTE - f/u Consult  Pharmacy Consult for Heparin IV Indication: pulmonary embolus  No Known Allergies  Patient Measurements: Height: 5\' 5"  (165.1 cm) Weight: 140 lb (63.5 kg) IBW/kg (Calculated) : 61.5 Heparin Dosing Weight: 63.5  Vital Signs: Temp: 97.4 F (36.3 C) (08/11 0000) Temp Source: Axillary (08/11 0000) BP: 135/87 (08/10 2014) Pulse Rate: 67 (08/10 2310)  Labs:  Recent Labs  01/17/2016 1350 01/15/2016 1437 01/20/2016 1703 01/27/2016 1817 01/24/2016 2354  HGB 13.4 15.3  --  12.5*  --   HCT 39.1 45.0  --  36.5*  --   PLT 326  --   --  327  --   APTT  --   --  57*  --   --   LABPROT  --   --  15.6*  --   --   INR  --   --  1.24  --   --   HEPARINUNFRC  --   --   --   --  <0.10*  CREATININE 0.66 0.60*  --   --   --   TROPONINI  --   --  0.05*  --   --     Estimated Creatinine Clearance: 73.7 mL/min (by C-G formula based on SCr of 0.8 mg/dL).   Medical History: Past Medical History:  Diagnosis Date  . Closed fracture of left wrist   . DVT   . ETOH abuse   . Hypokalemia   . Interstitial lung disease ([place])   . Pulmonary embolism     Medications:  Prescriptions Prior to Admission  Medication Sig Dispense Refill Last Dose  . magnesium oxide (MAG-OX) 400 (241.3 Mg) MG tablet Take 0.5 tablets (200 mg total) by mouth daily. 30 tablet 0 01/05/2016 at Unknown time  . Multiple Vitamins-Minerals (MULTIVITAMIN WITH MINERALS) tablet Take 1 tablet by mouth daily.    01/23/2016 at Unknown time  . ondansetron (ZOFRAN) 4 MG tablet Take 1 tablet (4 mg total) by mouth every 6 (six) hours as needed for nausea. 20 tablet 0 Past Month at Unknown time  . pantoprazole (PROTONIX) 40 MG tablet Take 1 tablet (40 mg total) by mouth 2 (two) times daily. 60 tablet 0 01/29/2016 at Unknown time  . potassium chloride SA (K-DUR,KLOR-CON) 20 MEQ tablet Take 1 tablet (20 mEq total) by mouth daily. 7 tablet 0 01/05/2016 at Unknown time  . predniSONE (DELTASONE) 20 MG tablet  Take 1 tablet (20 mg total) by mouth 2 (two) times daily with a meal. 60 tablet 0 01/08/2016 at Unknown time   Scheduled:  . heparin  2,000 Units Intravenous Once  . magnesium oxide  200 mg Oral Daily  . multivitamin with minerals  1 tablet Oral Daily  . pantoprazole  40 mg Oral BID  . potassium chloride SA  20 mEq Oral Daily  . predniSONE  20 mg Oral BID WC  . sodium chloride flush  3 mL Intravenous Q12H    Assessment: 31 yoM with PMH EtOH abuse with cirrhosis, DVT/PE, presents 01/22/2016 with SOB and likely recurrent PE noted on CT chest. Recent admission 7/30-8/4 for acute alcoholic hepatitis.    Baseline INR, aPTT: elevated d/t chronic liver disease; last INR 2.03 on 8/1  Prior anticoagulation: none  Significant events:  8/10  CBC: previously with noted chronic thrombocytopenia, now with normal Hgb and Plt  No bleeding or infusion issues per nursing  CrCl: 74 ml/min Today, 8/11  HL <0.10 units/ml- RN reported IV line infiltrated ~2100 and  new line was restarted ~ 2230. Will leave rate the same and rebolus with 2000 units.  Goal of Therapy: Heparin level 0.3-0.7 units/ml Monitor platelets by anticoagulation protocol: Yes  Plan:  Heparin 2000 units IV bolus x 1  Continue heparin drip at 1100 units/hr   Check heparin level 8 hrs after start  Daily CBC, daily heparin level once stable  Monitor for signs of bleeding or thrombosis    Dorrene German 01/13/2016, 12:55 AM

## 2016-01-13 NOTE — Consult Note (Signed)
PULMONARY / CRITICAL CARE MEDICINE   Name: Sean Jackson MRN: [id number] DOB: Oct 21, 1945    ADMISSION DATE:  01/17/2016 CONSULTATION DATE:  01/13/2016  REFERRING MD:  Dr. Wynelle Jackson, [facility]  CHIEF COMPLAINT:  Short of breath  HISTORY OF PRESENT ILLNESS:   70 yo male presented to ED with progressive dyspnea and hypoxia.  He had recent admission for alcoholic hepatitis.  He has hx of PE in 2014 and was previously on xarelto, and DVT of Lt leg in 2009.  CT angiogram chest showed acute PE.  He was placed on supplemental oxygen and started on heparin gtt.  He was feeling weak and nauseous.  He does not wear oxygen at home.  He has persistent hypoxemia.  He is followed in pulmonary office by Dr. Lamonte Jackson for ILD and PE.  He denies hx of smoking.  He denies trouble with swallowing or reflux.  PAST MEDICAL HISTORY :  He  has a past medical history of Closed fracture of left wrist; DVT; ETOH abuse; Hypokalemia; Interstitial lung disease ([place]); and Pulmonary embolism.  PAST SURGICAL HISTORY: He  has a past surgical history that includes LEFT HAND SURGERY; Ankle fracture surgery; and right heart catheterization (N/A, 05/19/2013).  No Known Allergies  No current facility-administered medications on file prior to encounter.    Current Outpatient Prescriptions on File Prior to Encounter  Medication Sig  . magnesium oxide (MAG-OX) 400 (241.3 Mg) MG tablet Take 0.5 tablets (200 mg total) by mouth daily.  . Multiple Vitamins-Minerals (MULTIVITAMIN WITH MINERALS) tablet Take 1 tablet by mouth daily.   . ondansetron (ZOFRAN) 4 MG tablet Take 1 tablet (4 mg total) by mouth every 6 (six) hours as needed for nausea.  . pantoprazole (PROTONIX) 40 MG tablet Take 1 tablet (40 mg total) by mouth 2 (two) times daily.  . potassium chloride SA (K-DUR,KLOR-CON) 20 MEQ tablet Take 1 tablet (20 mEq total) by mouth daily.  . predniSONE (DELTASONE) 20 MG tablet Take 1 tablet (20 mg total) by mouth 2 (two) times daily with  a meal.    FAMILY HISTORY:  His indicated that his mother is alive. He indicated that his father is deceased.    SOCIAL HISTORY: He  reports that he has never smoked. He has never used smokeless tobacco. He reports that he drinks about 0.5 oz of alcohol per week . He reports that he does not use drugs.  REVIEW OF SYSTEMS:   Negative except above.  SUBJECTIVE:  Nausea better.  VITAL SIGNS: BP 125/68   Pulse (!) 57   Temp 98.2 F (36.8 C) (Oral)   Resp (!) 26   Ht 5' 5"  (1.651 m)   Wt 146 lb 6.2 oz (66.4 kg)   SpO2 100%   BMI 24.36 kg/m   HEMODYNAMICS:    VENTILATOR SETTINGS: FiO2 (%):  [55 %-100 %] 100 %  INTAKE / OUTPUT: I/O last 3 completed shifts: In: 073 [P.O.:540; I.V.:235] Out: 1050 [Urine:1050]  PHYSICAL EXAMINATION: General: pleasant, speaks in full sentences w/o accessory muscle use Neuro:  Alert, normal strength, CN intact HEENT:  Pupils reactive, mild scleral icterus, no LAN Cardiovascular:  Regular, no murmur Lungs:  Basilar crackles, no wheeze Abdomen:  Soft, non tender, + bowel sounds Musculoskeletal:  Mild Lt > Rt edema Skin:  Dry skin  LABS:  BMET  Recent Labs Lab 01/19/2016 1350 01/17/2016 1437 01/13/16 0315  NA 138 139 135  K 4.0 3.9 4.7  CL 103 101 104  CO2 26  --  24  BUN 14 13 14   CREATININE 0.66 0.60* 0.62  GLUCOSE 166* 158* 167*    Electrolytes  Recent Labs Lab 02/02/2016 1350 01/13/16 0315  CALCIUM 8.7* 8.1*    CBC  Recent Labs Lab 01/11/2016 1350 01/25/2016 1437 01/06/2016 1817 01/13/16 0315  WBC 25.2*  --  23.7* 18.5*  HGB 13.4 15.3 12.5* 11.8*  HCT 39.1 45.0 36.5* 34.2*  PLT 326  --  327 314    Coag's  Recent Labs Lab 01/11/2016 1703  APTT 57*  INR 1.24    Sepsis Markers No results for input(s): LATICACIDVEN, PROCALCITON, O2SATVEN in the last 168 hours.  ABG  Recent Labs Lab 01/09/2016 1305  PHART 7.522*  PCO2ART 29.6*  PO2ART 88.4    Liver Enzymes  Recent Labs Lab 01/04/2016 1350  AST 218*   ALT 325*  ALKPHOS 296*  BILITOT 4.3*  ALBUMIN 3.2*    Cardiac Enzymes  Recent Labs Lab 01/21/2016 1703  TROPONINI 0.05*    Glucose No results for input(s): GLUCAP in the last 168 hours.  Imaging No results found.   STUDIES:  12/13/15 PFT >> FEV1 1.90 (73%), FEV1% 85, TLC 4.28 (71%), DLCO 39% 02/01/2016 CT angio chest >> PE Rt main PA, chronic PE Rt upper/middle/lower lobes, chronic PE Lt PA, RV:LV ratio 1, basilar BTX and interstitial changes b/l, Lt lung areas of GGO 01/13/16 Echo >> mild LVH, EF 55 to 09%, grade 1 diastolic CHF, mild increase PA pressures 01/13/16 Doppler legs b/l >> Lt common femoral DVT, Rt popliteal and posterior tibial DVT  SIGNIFICANT EVENTS: 8/10 Admit, start heparin gtt  LINES/TUBES:  DISCUSSION: 70 yo male with fatigue, nausea/vomiting, dyspnea from acute on chronic PE with b/l leg DVT.  He has recent admission for alcoholic hepatitis.  He is followed by Dr. Lamonte Jackson for ILD, and has new areas of GGO in Lt leg on CT chest.  He has persistent hypoxemia.  ASSESSMENT / PLAN:  PULMONARY A: Acute hypoxic respiratory failure. GGO Lt lung on CT chest >> likely from relative hyperemia in setting of acute PE in Rt PA. Basilar predominant ILD with BTX. P:   Continue supplemental oxygen to keep SpO2 > 92% F/u CXR Check ESR, BNP  CARDIOVASCULAR A:  Acute on chronic pulmonary embolism with b/l leg DVT. P:  Continue heparin gtt for now Monitor hemodynamics No indication for thrombolytic therapy at this time  RENAL A:   No acute issues. P:   Monitor renal fx, urine outpt, electrolytes  GASTROINTESTINAL A:   Alcoholic hepatitis. P:   Prednisone per primary team  HEMATOLOGIC A:   Leukocytosis. P:  F/u CBC  INFECTIOUS A:   No evidence for infection. P:   Monitor clinically  ENDOCRINE A:   No acute issues.   P:   Monitor blood sugar on BMET  NEUROLOGIC A:   No acute issues. P:   Monitor mental status   Sean Mires, MD [place] 01/13/2016, 5:29 PM Pager:  [phone] After 3pm call: [phone]

## 2016-01-13 NOTE — Progress Notes (Signed)
[facility]  Patient Status: Active (receiving services up to time of hospitalization)  [facility] is providing the following services: PT  If patient discharges after hours, please call [phone].   Sean Jackson 01/13/2016, 2:15 PM

## 2016-01-14 ENCOUNTER — Inpatient Hospital Stay (HOSPITAL_COMMUNITY): Payer: Medicare Other

## 2016-01-14 ENCOUNTER — Encounter (HOSPITAL_COMMUNITY): Payer: Medicare Other

## 2016-01-14 DIAGNOSIS — I82492 Acute embolism and thrombosis of other specified deep vein of left lower extremity: Secondary | ICD-10-CM

## 2016-01-14 DIAGNOSIS — I2609 Other pulmonary embolism with acute cor pulmonale: Secondary | ICD-10-CM

## 2016-01-14 DIAGNOSIS — I82403 Acute embolism and thrombosis of unspecified deep veins of lower extremity, bilateral: Secondary | ICD-10-CM

## 2016-01-14 DIAGNOSIS — I2690 Septic pulmonary embolism without acute cor pulmonale: Secondary | ICD-10-CM

## 2016-01-14 DIAGNOSIS — I82491 Acute embolism and thrombosis of other specified deep vein of right lower extremity: Secondary | ICD-10-CM

## 2016-01-14 DIAGNOSIS — J810 Acute pulmonary edema: Secondary | ICD-10-CM

## 2016-01-14 DIAGNOSIS — R0902 Hypoxemia: Secondary | ICD-10-CM

## 2016-01-14 LAB — HEPARIN LEVEL (UNFRACTIONATED): HEPARIN UNFRACTIONATED: 0.53 [IU]/mL (ref 0.30–0.70)

## 2016-01-14 LAB — COMPREHENSIVE METABOLIC PANEL
ALBUMIN: 3 g/dL — AB (ref 3.5–5.0)
ALT: 259 U/L — AB (ref 17–63)
ANION GAP: 8 (ref 5–15)
AST: 139 U/L — ABNORMAL HIGH (ref 15–41)
Alkaline Phosphatase: 242 U/L — ABNORMAL HIGH (ref 38–126)
BUN: 19 mg/dL (ref 6–20)
CHLORIDE: 102 mmol/L (ref 101–111)
CO2: 24 mmol/L (ref 22–32)
Calcium: 8.5 mg/dL — ABNORMAL LOW (ref 8.9–10.3)
Creatinine, Ser: 0.69 mg/dL (ref 0.61–1.24)
GFR calc Af Amer: >60 mL/min (ref >60)
Glucose, Bld: 199 mg/dL — ABNORMAL HIGH (ref 65–99)
POTASSIUM: 5 mmol/L (ref 3.5–5.1)
SODIUM: 134 mmol/L — AB (ref 135–145)
TOTAL PROTEIN: 6.3 g/dL — AB (ref 6.5–8.1)
Total Bilirubin: 3.7 mg/dL — ABNORMAL HIGH (ref 0.3–1.2)

## 2016-01-14 LAB — CBC
HCT: 35.5 % — ABNORMAL LOW (ref 39.0–52.0)
Hemoglobin: 12.4 g/dL — ABNORMAL LOW (ref 13.0–17.0)
MCH: 42.3 pg — ABNORMAL HIGH (ref 26.0–34.0)
MCHC: 34.9 g/dL (ref 30.0–36.0)
MCV: 121.2 fL — AB (ref 78.0–100.0)
PLATELETS: 347 10*3/uL (ref 150–400)
RBC: 2.93 MIL/uL — AB (ref 4.22–5.81)
RDW: 17.6 % — ABNORMAL HIGH (ref 11.5–15.5)
WBC: 20.5 10*3/uL — AB (ref 4.0–10.5)

## 2016-01-14 NOTE — Consult Note (Signed)
Hospital Consult    Reason for Consult:  PE with ble dvt Referring Physician:  Dr. Wynelle Cleveland MRN #:  [id number]  History of Present Illness: This is a 70 y.o. male with history of pe in the past s/p completion of xarelto therapy in 2015. Now presents with a few days of worsening dyspnea found to have R main pulmonary artery embolus and now dvt in left femoral vein, right popliteal and pt. He is currently on heparin gtt and requiring 6L nasal cannula. Vascular consulted for possible ivc filter.  Past Medical History:  Diagnosis Date  . Closed fracture of left wrist   . DVT   . ETOH abuse   . Hypokalemia   . Interstitial lung disease ([place])   . Pulmonary embolism     Past Surgical History:  Procedure Laterality Date  . ANKLE FRACTURE SURGERY    . LEFT HAND SURGERY    . RIGHT HEART CATHETERIZATION N/A 05/19/2013   Procedure: RIGHT HEART CATH;  Surgeon: Jolaine Artist, MD;  Location: [facility] CATH LAB;  Service: Cardiovascular;  Laterality: N/A;    No Known Allergies  Prior to Admission medications   Medication Sig Start Date End Date Taking? Authorizing Provider  magnesium oxide (MAG-OX) 400 (241.3 Mg) MG tablet Take 0.5 tablets (200 mg total) by mouth daily. 01/06/16  Yes Theodis Blaze, MD  Multiple Vitamins-Minerals (MULTIVITAMIN WITH MINERALS) tablet Take 1 tablet by mouth daily.    Yes Historical Provider, MD  ondansetron (ZOFRAN) 4 MG tablet Take 1 tablet (4 mg total) by mouth every 6 (six) hours as needed for nausea. 01/06/16  Yes Theodis Blaze, MD  pantoprazole (PROTONIX) 40 MG tablet Take 1 tablet (40 mg total) by mouth 2 (two) times daily. 01/06/16  Yes Theodis Blaze, MD  potassium chloride SA (K-DUR,KLOR-CON) 20 MEQ tablet Take 1 tablet (20 mEq total) by mouth daily. 01/06/16  Yes Theodis Blaze, MD  predniSONE (DELTASONE) 20 MG tablet Take 1 tablet (20 mg total) by mouth 2 (two) times daily with a meal. 01/06/16  Yes Theodis Blaze, MD    Social History   Social History  . Marital  status: Single    Spouse name: N/A  . Number of children: 0  . Years of education: N/A   Occupational History  . Retired-insurance investigator    Social History Main Topics  . Smoking status: Never Smoker  . Smokeless tobacco: Never Used  . Alcohol use 0.5 oz/week    1 Standard drinks or equivalent per week     Comment: rare  . Drug use: No  . Sexual activity: No   Other Topics Concern  . Not on file   Social History Narrative  . No narrative on file    Family History  Problem Relation Age of Onset  . Heart attack Father 71    ROS: [x]  Positive   [ ]  Negative   [ ]  All sytems reviewed and are negative  Cardiovascular: []  chest pain/pressure []  palpitations []  SOB lying flat [x]  DOE []  pain in legs while walking []  pain in legs at rest []  pain in legs at night []  non-healing ulcers [x]  hx of DVT [x]  swelling in legs (left ankle)  Pulmonary: X shortness of breath at rest X Previous PE []  productive cough []  asthma/wheezing []  home O2  Neurologic: []  weakness in []  arms []  legs []  numbness in []  arms []  legs []  hx of CVA []  mini stroke [] difficulty speaking or slurred  speech []  temporary loss of vision in one eye []  dizziness  Hematologic: []  hx of cancer []  bleeding problems [x]  problems with blood clotting easily - no specific disorder known  Endocrine:   []  diabetes []  thyroid disease  GI []  vomiting blood []  blood in stool   Constitutional: []  fever []  chills   Physical Examination  Vitals:   01/14/16 0800 01/14/16 1200  BP:    Pulse:    Resp:    Temp: 97.7 F (36.5 C) 98.2 F (36.8 C)   Body mass index is 23.19 kg/m.  General:  WDWN in NAD Gait: Not observed HENT: WNL, normocephalic Pulmonary: stable on 6 liters Cardiac: regular Abdomen: soft, NT/ND, no masses Vascular Exam/Pulses: Palpable femorals, dp/pt bilaterally Extremities: without ischemic changes, without Gangrene , without cellulitis; without open wounds;    Musculoskeletal: no muscle wasting or atrophy  Neurologic: A&O X 3; Appropriate Affect ; SENSATION: normal; MOTOR FUNCTION:  moving all extremities equally. Speech is fluent/normal   CBC    Component Value Date/Time   WBC 20.5 (H) 01/14/2016 0228   RBC 2.93 (L) 01/14/2016 0228   HGB 12.4 (L) 01/14/2016 0228   HCT 35.5 (L) 01/14/2016 0228   HCT 33.0 (L) 01/03/2016 0948   PLT 347 01/14/2016 0228   MCV 121.2 (H) 01/14/2016 0228   MCH 42.3 (H) 01/14/2016 0228   MCHC 34.9 01/14/2016 0228   RDW 17.6 (H) 01/14/2016 0228   LYMPHSABS 1.7 01/10/2016 1817   MONOABS 1.2 (H) 01/04/2016 1817   EOSABS 0.0 01/18/2016 1817   BASOSABS 0.0 01/28/2016 1817    BMET    Component Value Date/Time   NA 134 (L) 01/14/2016 0228   K 5.0 01/14/2016 0228   CL 102 01/14/2016 0228   CO2 24 01/14/2016 0228   GLUCOSE 199 (H) 01/14/2016 0228   BUN 19 01/14/2016 0228   CREATININE 0.69 01/14/2016 0228   CALCIUM 8.5 (L) 01/14/2016 0228   GFRNONAA >60 01/14/2016 0228   GFRAA >60 01/14/2016 0228    COAGS: Lab Results  Component Value Date   INR 1.24 01/11/2016   INR 2.03 01/03/2016   INR 2.03 01/01/2016     Non-Invasive Vascular Imaging:   Bilateral lower extremity venous duplex has been  completed.      Left: DVT noted in the common femoral vein, Prominal and distal femoral .  Right:  DVT noted in the Popliteal vein and the posterior tibial vein.   Bilateral: No evidence of superficial thrombosis.  No Baker's cyst.    ASSESSMENT/PLAN: This is a 70 y.o. male with 2nd pe this time R main pa, last in 2014 and finished xarelto in 2015. Now requiring 6L nasal cannula but with limited R heart strain per echo and bnp at 500 and pulmonary critical care not considering pe lysis. At this time he should continue heparin with a plan to transition to noac or coumadin prior to discharge. Will check ivc and iliac veins for more proximal dvt but would not currently recommend ivc filter if otherwise  tolerating anticoagulation.   [name] C. [name], MD [facility] Office: [phone] Pager: [phone]

## 2016-01-14 NOTE — Progress Notes (Addendum)
ANTICOAGULATION CONSULT NOTE  Pharmacy Consult for Heparin IV Indication: pulmonary embolus  No Known Allergies  Patient Measurements: Height: 5\' 5"  (165.1 cm) Weight: 139 lb 5.3 oz (63.2 kg) IBW/kg (Calculated) : 61.5 Heparin Dosing Weight: actual body weight  Vital Signs: Temp: 97.9 F (36.6 C) (08/12 0400) Temp Source: Axillary (08/12 0400) BP: 100/55 (08/12 0625) Pulse Rate: 62 (08/12 0600)  Labs:  Recent Labs  01/23/2016 1437 01/11/2016 1703 01/09/2016 1817  01/13/16 0315 01/13/16 0747 01/13/16 1820 01/14/16 0228  HGB 15.3  --  12.5*  --  11.8*  --   --  12.4*  HCT 45.0  --  36.5*  --  34.2*  --   --  35.5*  PLT  --   --  327  --  314  --   --  347  APTT  --  57*  --   --   --   --   --   --   LABPROT  --  15.6*  --   --   --   --   --   --   INR  --  1.24  --   --   --   --   --   --   HEPARINUNFRC  --   --   --   < >  --  0.72* 0.47 0.53  CREATININE 0.60*  --   --   --  0.62  --   --  0.69  TROPONINI  --  0.05*  --   --   --   --   --   --   < > = values in this interval not displayed.  Estimated Creatinine Clearance: 73.7 mL/min (by C-G formula based on SCr of 0.8 mg/dL).   Medical History: Past Medical History:  Diagnosis Date  . Closed fracture of left wrist   . DVT   . ETOH abuse   . Hypokalemia   . Interstitial lung disease ([place])   . Pulmonary embolism     Assessment: 67 yoM with PMH EtOH abuse with cirrhosis, DVT/PE, presents 01/23/2016 with SOB and likely recurrent PE noted on CT chest. Recent admission 7/30-8/4 for acute alcoholic hepatitis.    Baseline INR = 1.24, aPTT = 57sec: elevated d/t chronic liver disease; last INR 2.03 on 8/1  Prior anticoagulation: none  Significant events: 8/10 23:54 HL < 0.1 but heparin off d/t PIV infiltration and new line placed 8/11 LE dopplers reveal BL DVT  Today, 01/14/16:  Heparin level therapeutic x 2 on heparin infusion at 1050 units/hr  CBC: stable  Previously with noted chronic thrombocytopenia,  but Pltc currently WNL  No complications of therapy noted  Goal of Therapy: Heparin level 0.3-0.7 units/ml Monitor platelets by anticoagulation protocol: Yes  Plan:  Continue heparin infusion at 1050 units/hr   Daily CBC, heparin level while on heparin infusion  Monitor for s/sx of bleeding   Doreene Eland, PharmD, BCPS.   Pager: [phone] 01/14/2016 7:31 AM

## 2016-01-14 NOTE — Progress Notes (Signed)
PROGRESS NOTE    Sean Jackson  [id number] DOB: [birth year] DOA: 01/09/2016  PCP: Aretta Nip, MD   Brief Narrative:  Sean Jackson is a 70 y.o. male with medical history significant of alcohol abuse with recent admission for alcoholic hepatitis discharged on 8/4 on steroid treatment, cirrhosis, PE in the past (Xarelto in 2014) and anemia who presents for dyspnea x 3-4 days. Found to be hypoxic in the ER. CT chest shows acute PE in right main pulmonary artery .  Subjective: No dyspnea at rest. No chest pain. No cough, nausea or vomiting.   Assessment & Plan:   Principal Problem: Pulmonary embolism  - 3 occurrence of thrombus - unprovoked saddle PE in 2014 with right leg DVT- h/o left leg DVT 5 yrs prior to this- he was on Xarelto which was discontinued on 9/15 as no genetic predisposition/ hypercoagulable state was found - he admits that he had been more sendentary over the past few weeks - venous duplex showing b/l DVT - previously not on 02 -  still quite hypoxic and requiring non-re breather - has underlying lung disease with fibrosis and bronchiectasis and persistent chronic PEs which may be contributing to the hypoxia  - pulmonary following- no indication for thrombolysis  Active Problems: B/l lower extremity DVT - due to sedentary lifestyle and possible underlying coagulopathic disorder- has been worked up in the past for hypercoagulable state but testing was negative  Interstitial lung disease - has been evaluated by pulmonary recently and is being followed- not hypoxic at baseline  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 Right:  DVT noted in the Popliteal vein and the posterior tibial vein.   8/12 ECHO Normal LV systolic function; grade 1 diastolic dysfunction; trace   MR; mild TR; mildly elevated pulmonary pressure.  Antimicrobials:  Anti-infectives    None       Objective: Vitals:   01/14/16 0500 01/14/16 0600 01/14/16 0625 01/14/16 0800  BP:  (!) 93/41 (!) 100/55   Pulse: 60 62    Resp: (!) 23 (!) 21    Temp:    97.7 F (36.5 C)  TempSrc:    Oral  SpO2: 93% 93%    Weight:      Height:        Intake/Output Summary (Last 24 hours) at 01/14/16 1155 Last data filed at 01/14/16 1030  Gross per 24 hour  Intake            471.5 ml  Output             3125 ml  Net          -2653.5 ml   Filed Weights   01/15/2016 1230 01/13/16 0428 01/14/16 0400  Weight: 63.5 kg (140 lb) 66.4 kg (146 lb 6.2 oz) 63.2 kg (139 lb 5.3 oz)    Examination: General exam: Appears comfortable  HEENT: PERRLA, oral mucosa moist, no sclera icterus or thrush Respiratory system: Clear to auscultation. Respiratory effort normal.- pulse ox 95% on 100% non-rebreather Cardiovascular system: S1 & S2  heard, RRR.  No murmurs  Gastrointestinal system: Abdomen soft, non-tender, nondistended. Normal bowel sound. No organomegaly Central nervous system: Alert and oriented. No focal neurological deficits. Extremities: No cyanosis, clubbing - left leg edema improving Skin: No rashes or ulcers Psychiatry:  Mood & affect appropriate.     Data Reviewed: I have personally reviewed following labs and imaging studies  CBC:  Recent Labs Lab 01/25/2016 1350 01/14/2016 1437 01/09/2016 1817 01/13/16 0315 01/14/16 0228  WBC 25.2*  --  23.7* 18.5* 20.5*  NEUTROABS 23.4*  --  20.8*  --   --   HGB 13.4 15.3 12.5* 11.8* 12.4*  HCT 39.1 45.0 36.5* 34.2* 35.5*  MCV 123.3*  --  122.9* 121.7* 121.2*  PLT 326  --  327 314 [id number]   Basic Metabolic  Panel:  Recent Labs Lab 02/02/2016 1350 01/14/2016 1437 01/13/16 0315 01/14/16 0228  NA 138 139 135 134*  K 4.0 3.9 4.7 5.0  CL 103 101 104 102  CO2 26  --  24 24  GLUCOSE 166* 158* 167* 199*  BUN 14 13 14 19   CREATININE 0.66 0.60* 0.62 0.69  CALCIUM 8.7*  --  8.1* 8.5*   GFR: Estimated Creatinine Clearance: 73.7 mL/min (by C-G formula based on SCr of 0.8 mg/dL). Liver Function Tests:  Recent Labs Lab 01/10/2016 1350 01/14/16 0228  AST 218* 139*  ALT 325* 259*  ALKPHOS 296* 242*  BILITOT 4.3* 3.7*  PROT 7.0 6.3*  ALBUMIN 3.2* 3.0*   No results for input(s): LIPASE, AMYLASE in the last 168 hours. No results for input(s): AMMONIA in the last 168 hours. Coagulation Profile:  Recent Labs Lab 01/23/2016 1703  INR 1.24   Cardiac Enzymes:  Recent Labs Lab 01/09/2016 1703  TROPONINI 0.05*   BNP (last 3 results) No results for input(s): PROBNP in the last 8760 hours. HbA1C: No results for input(s): HGBA1C in the last 72 hours. CBG: No results for input(s): GLUCAP in the last 168 hours. Lipid Profile: No results for input(s): CHOL, HDL, LDLCALC, TRIG, CHOLHDL, LDLDIRECT in the last 72 hours. Thyroid Function Tests: No results for input(s): TSH, T4TOTAL, FREET4, T3FREE, THYROIDAB in the last 72 hours. Anemia Panel: No results for input(s): VITAMINB12, FOLATE, FERRITIN, TIBC, IRON, RETICCTPCT in the last 72 hours. Urine analysis:    Component Value Date/Time   COLORURINE AMBER (A) 05/18/2013 0830   APPEARANCEUR CLEAR 05/18/2013 0830   LABSPEC >1.046 (H) 05/18/2013 0830   PHURINE 5.5 05/18/2013 0830   GLUCOSEU NEGATIVE 05/18/2013 0830   HGBUR NEGATIVE 05/18/2013 0830   BILIRUBINUR NEGATIVE 05/18/2013 0830   KETONESUR NEGATIVE 05/18/2013 0830   PROTEINUR NEGATIVE 05/18/2013 0830   UROBILINOGEN 1.0 05/18/2013 0830   NITRITE NEGATIVE 05/18/2013 0830   LEUKOCYTESUR NEGATIVE 05/18/2013 0830   Sepsis Labs: @LABRCNTIP (procalcitonin:4,lacticidven:4) )No results found  for this or any previous visit (from the past 240 hour(s)).       Radiology Studies: Ct Angio Chest Pe W And/or Wo Contrast  Result Date: 01/25/2016 CLINICAL DATA:  Shortness of breath. Hypoxia. Previous pulmonary emboli. EXAM: CT ANGIOGRAPHY CHEST WITH CONTRAST TECHNIQUE: Multidetector CT imaging of the chest was performed using the standard protocol during bolus administration of intravenous contrast. Multiplanar CT image reconstructions and MIPs were obtained to evaluate the vascular anatomy. CONTRAST:  70 cc Isovue COMPARISON:  Chest x-ray dated 01/16/2016 and chest CT dated 08/03/2015 and CT angiogram dated 05/18/2013 FINDINGS: Mediastinum/Lymph Nodes: There is a prominent pulmonary embolus in the right main pulmonary artery measuring approximately 2.4 cm  in diameter. There are chronic pulmonary emboli extending into the right middle, right upper, and right lower lobes with small residual bands in some of the vessels. There are also chronic bandlike pulmonary emboli in the left pulmonary arteries. RV/LV ratio is 1.0. Overall heart size is normal. No adenopathy. Lungs/Pleura: Chronic obstructive lung disease with chronic peripheral interstitial disease at the lung bases with chronic bronchiectasis at both bases. New hazy infiltrate throughout the left lower lobe and less extensively in the left upper lobe. There is increased perfusion to the left lung as compared to the right and compared to the prior study of 08/03/2015. No effusions. Upper abdomen: Normal. Musculoskeletal: No chest wall mass or suspicious bone lesions identified. Review of the MIP images confirms the above findings. IMPRESSION: 1. Chronic bilateral pulmonary emboli. 2. 2.4 cm embolus in the right main pulmonary artery which I suspect represents a recurrent pulmonary embolus. There is new shunting of blood to the left lung with hazy infiltrate in the left lung which I suspect represents pulmonary edema. 3. Bronchiectasis at the lung  bases. 4. These abnormalities less likely could represent usual interstitial pneumonitis. I recommend a follow-up chest CT without contrast in 6 months for further evaluation. Critical Value/emergent results were called by telephone at the time of interpretation on 01/08/2016 at 3:24 pm to Dr. Addison Lank , who verbally acknowledged these results. . Electronically Signed   By: Lorriane Shire M.D.   On: 01/31/2016 15:24   Dg Chest Port 1 View  Result Date: 01/14/2016 CLINICAL DATA:  Respiratory failure.  Pulmonary embolism. EXAM: PORTABLE CHEST 1 VIEW COMPARISON:  01/23/2016 FINDINGS: Progression of left lower lobe airspace disease. Progression of small left pleural effusion. Right lung remains clear.  Negative for edema. IMPRESSION: Progression of left lower lobe atelectasis/ infiltrate and left pleural effusion. Electronically Signed   By: Franchot Gallo M.D.   On: 01/14/2016 07:17   Dg Chest Port 1 View  Result Date: 01/25/2016 CLINICAL DATA:  Left leg swelling, shortness of Breath EXAM: PORTABLE CHEST 1 VIEW COMPARISON:  01/01/2016 FINDINGS: Cardiomediastinal silhouette is stable. There is streaky left basilar atelectasis or infiltrate. No pulmonary edema. IMPRESSION: No pulmonary edema. Streaky left basilar atelectasis or early infiltrate. Electronically Signed   By: Lahoma Crocker M.D.   On: 01/08/2016 12:59      Scheduled Meds: . magnesium oxide  200 mg Oral Daily  . multivitamin with minerals  1 tablet Oral Daily  . pantoprazole  40 mg Oral BID  . potassium chloride SA  20 mEq Oral Daily  . predniSONE  20 mg Oral BID WC  . sodium chloride flush  3 mL Intravenous Q12H   Continuous Infusions: . heparin 1,050 Units/hr (01/14/16 0821)     LOS: 2 days    Time spent in minutes: 72    [place], MD [facility] Pager: [email or web address] Password [facility] 01/14/2016, 11:55 AM

## 2016-01-14 NOTE — Progress Notes (Signed)
ANTICOAGULATION CONSULT NOTE  Pharmacy Consult for Heparin IV Indication: pulmonary embolus  No Known Allergies  Patient Measurements: Height: 5\' 5"  (165.1 cm) Weight: 146 lb 6.2 oz (66.4 kg) IBW/kg (Calculated) : 61.5 Heparin Dosing Weight: actual body weight  Vital Signs: Temp: 97.6 F (36.4 C) (08/12 0000) Temp Source: Axillary (08/12 0000) BP: 113/65 (08/12 0200) Pulse Rate: 58 (08/12 0300)  Labs:  Recent Labs  01/25/2016 1437 01/13/2016 1703 01/27/2016 1817  01/13/16 0315 01/13/16 0747 01/13/16 1820 01/14/16 0228  HGB 15.3  --  12.5*  --  11.8*  --   --  12.4*  HCT 45.0  --  36.5*  --  34.2*  --   --  35.5*  PLT  --   --  327  --  314  --   --  347  APTT  --  57*  --   --   --   --   --   --   LABPROT  --  15.6*  --   --   --   --   --   --   INR  --  1.24  --   --   --   --   --   --   HEPARINUNFRC  --   --   --   < >  --  0.72* 0.47 0.53  CREATININE 0.60*  --   --   --  0.62  --   --  0.69  TROPONINI  --  0.05*  --   --   --   --   --   --   < > = values in this interval not displayed.  Estimated Creatinine Clearance: 73.7 mL/min (by C-G formula based on SCr of 0.8 mg/dL).   Medical History: Past Medical History:  Diagnosis Date  . Closed fracture of left wrist   . DVT   . ETOH abuse   . Hypokalemia   . Interstitial lung disease ([place])   . Pulmonary embolism     Assessment: 27 yoM with PMH EtOH abuse with cirrhosis, DVT/PE, presents 01/30/2016 with SOB and likely recurrent PE noted on CT chest. Recent admission 7/30-8/4 for acute alcoholic hepatitis.    Baseline INR = 1.24, aPTT = 57sec: elevated d/t chronic liver disease; last INR 2.03 on 8/1  Prior anticoagulation: none  Significant events: 8/10 23:54 HL < 0.1 but heparin off d/t PIV infiltration and new line placed  Today, 01/14/16:  Heparin level @ 0228 = 0.53, therapeutic on heparin infusion at 1050 units/hr  CBC: stable  Previously with noted chronic thrombocytopenia, but Pltc currently  WNL  No complications of therapy noted  Goal of Therapy: Heparin level 0.3-0.7 units/ml Monitor platelets by anticoagulation protocol: Yes  Plan:  Continue heparin infusion at 1050 units/hr   Daily CBC, heparin level while on heparin infusion  Monitor for s/sx of bleeding   Leone Haven, PharmD 01/14/16 @ 04:01

## 2016-01-14 NOTE — Progress Notes (Signed)
PULMONARY / CRITICAL CARE MEDICINE   Name: Sean Jackson MRN: [id number] DOB: Jul 18, 1945    ADMISSION DATE:  01/07/2016 CONSULTATION DATE:  01/13/2016  REFERRING MD:  Dr. Wynelle Cleveland, [facility]  CHIEF COMPLAINT:  Short of breath  SUBJECTIVE:  Remains on NRB mask.  Denies chest pain.  Feels breathing is better.  VITAL SIGNS: BP (!) 100/55   Pulse 62   Temp 97.7 F (36.5 C) (Oral)   Resp (!) 21   Ht 5\' 5"  (1.651 m)   Wt 139 lb 5.3 oz (63.2 kg)   SpO2 93%   BMI 23.19 kg/m   INTAKE / OUTPUT: I/O last 3 completed shifts: In: 1243.5 [P.O.:540; I.V.:703.5] Out: 3425 [Urine:3425]  PHYSICAL EXAMINATION: General: pleasant, speaks in full sentences w/o accessory muscle use Neuro:  Alert, normal strength, CN intact HEENT:  Pupils reactive, mild scleral icterus, no LAN Cardiovascular:  Regular, no murmur Lungs:  Basilar crackles, no wheeze Abdomen:  Soft, non tender, + bowel sounds Musculoskeletal:  Mild Lt > Rt edema Skin:  Dry skin  LABS:  BMET  Recent Labs Lab 01/11/2016 1350 01/25/2016 1437 01/13/16 0315 01/14/16 0228  NA 138 139 135 134*  K 4.0 3.9 4.7 5.0  CL 103 101 104 102  CO2 26  --  24 24  BUN 14 13 14 19   CREATININE 0.66 0.60* 0.62 0.69  GLUCOSE 166* 158* 167* 199*    Electrolytes  Recent Labs Lab 01/23/2016 1350 01/13/16 0315 01/14/16 0228  CALCIUM 8.7* 8.1* 8.5*    CBC  Recent Labs Lab 01/15/2016 1817 01/13/16 0315 01/14/16 0228  WBC 23.7* 18.5* 20.5*  HGB 12.5* 11.8* 12.4*  HCT 36.5* 34.2* 35.5*  PLT 327 314 347    Coag's  Recent Labs Lab 01/13/2016 1703  APTT 57*  INR 1.24    Sepsis Markers No results for input(s): LATICACIDVEN, PROCALCITON, O2SATVEN in the last 168 hours.  ABG  Recent Labs Lab 01/25/2016 1305  PHART 7.522*  PCO2ART 29.6*  PO2ART 88.4    Liver Enzymes  Recent Labs Lab 01/30/2016 1350 01/14/16 0228  AST 218* 139*  ALT 325* 259*  ALKPHOS 296* 242*  BILITOT 4.3* 3.7*  ALBUMIN 3.2* 3.0*    Cardiac  Enzymes  Recent Labs Lab 01/13/2016 1703  TROPONINI 0.05*    Glucose No results for input(s): GLUCAP in the last 168 hours.  Imaging Dg Chest Port 1 View  Result Date: 01/14/2016 CLINICAL DATA:  Respiratory failure.  Pulmonary embolism. EXAM: PORTABLE CHEST 1 VIEW COMPARISON:  01/29/2016 FINDINGS: Progression of left lower lobe airspace disease. Progression of small left pleural effusion. Right lung remains clear.  Negative for edema. IMPRESSION: Progression of left lower lobe atelectasis/ infiltrate and left pleural effusion. Electronically Signed   By: Franchot Gallo M.D.   On: 01/14/2016 07:17     STUDIES:  12/13/15 PFT >> FEV1 1.90 (73%), FEV1% 85, TLC 4.28 (71%), DLCO 39% 02/01/2016 CT angio chest >> PE Rt main PA, chronic PE Rt upper/middle/lower lobes, chronic PE Lt PA, RV:LV ratio 1, basilar BTX and interstitial changes b/l, Lt lung areas of GGO 01/13/16 Echo >> mild LVH, EF 55 to 123456, grade 1 diastolic CHF, mild increase PA pressures 01/13/16 Doppler legs b/l >> Lt common femoral DVT, Rt popliteal and posterior tibial DVT  SIGNIFICANT EVENTS: 8/10 Admit, start heparin gtt  LINES/TUBES:  DISCUSSION: 70 yo male with fatigue, nausea/vomiting, dyspnea from acute on chronic PE with b/l leg DVT.  He has recent admission for alcoholic hepatitis.  He is followed by Dr. Lamonte Sakai for ILD, and has new areas of GGO in Lt leg on CT chest.  He has persistent hypoxemia.  He is DNR/DNI.  ASSESSMENT / PLAN:  PULMONARY A: Acute hypoxic respiratory failure. GGO Lt lung on CT chest >> likely from relative hyperemia in setting of acute PE in Rt PA. Basilar predominant ILD with BTX. 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 P:  Continue heparin gtt for now Monitor hemodynamics No indication for thrombolytic therapy at this time  RENAL A:   No acute issues. P:   Monitor renal fx,  urine outpt, electrolytes  GASTROINTESTINAL A:   Alcoholic hepatitis. P:   Prednisone per primary team  HEMATOLOGIC A:   Leukocytosis. P:  F/u CBC  INFECTIOUS A:   No evidence for infection. P:   Monitor clinically  ENDOCRINE A:   No acute issues.   P:   Monitor blood sugar on BMET  NEUROLOGIC A:   No acute issues. P:   Monitor mental status  PCCM will f/u again on Monday, 8/14 >> call if help needed sooner.  [name], MD [facility] Pulmonary/Critical Care 01/14/2016, 10:53 AM Pager:  [phone] After 3pm call: [phone]

## 2016-01-15 ENCOUNTER — Inpatient Hospital Stay (HOSPITAL_COMMUNITY): Payer: Medicare Other

## 2016-01-15 DIAGNOSIS — I2699 Other pulmonary embolism without acute cor pulmonale: Secondary | ICD-10-CM

## 2016-01-15 DIAGNOSIS — I824Z3 Acute embolism and thrombosis of unspecified deep veins of distal lower extremity, bilateral: Secondary | ICD-10-CM

## 2016-01-15 DIAGNOSIS — M7989 Other specified soft tissue disorders: Secondary | ICD-10-CM

## 2016-01-15 LAB — BASIC METABOLIC PANEL
Anion gap: 7 (ref 5–15)
BUN: 24 mg/dL — AB (ref 6–20)
CHLORIDE: 102 mmol/L (ref 101–111)
CO2: 25 mmol/L (ref 22–32)
CREATININE: 0.74 mg/dL (ref 0.61–1.24)
Calcium: 8.5 mg/dL — ABNORMAL LOW (ref 8.9–10.3)
GFR calc Af Amer: >60 mL/min (ref >60)
GLUCOSE: 154 mg/dL — AB (ref 65–99)
POTASSIUM: 4.9 mmol/L (ref 3.5–5.1)
SODIUM: 134 mmol/L — AB (ref 135–145)

## 2016-01-15 LAB — HEPARIN LEVEL (UNFRACTIONATED): Heparin Unfractionated: 0.44 IU/mL (ref 0.30–0.70)

## 2016-01-15 LAB — CBC
HCT: 35.4 % — ABNORMAL LOW (ref 39.0–52.0)
Hemoglobin: 11.9 g/dL — ABNORMAL LOW (ref 13.0–17.0)
MCH: 40.9 pg — AB (ref 26.0–34.0)
MCHC: 33.6 g/dL (ref 30.0–36.0)
MCV: 121.6 fL — AB (ref 78.0–100.0)
PLATELETS: 365 10*3/uL (ref 150–400)
RBC: 2.91 MIL/uL — ABNORMAL LOW (ref 4.22–5.81)
RDW: 17.1 % — ABNORMAL HIGH (ref 11.5–15.5)
WBC: 18.3 10*3/uL — ABNORMAL HIGH (ref 4.0–10.5)

## 2016-01-15 MED ORDER — ZOLPIDEM TARTRATE 5 MG PO TABS: 5.0000 mg | ORAL_TABLET | Freq: Every evening | ORAL | Status: DC | PRN

## 2016-01-15 NOTE — Progress Notes (Signed)
ANTICOAGULATION CONSULT NOTE  Pharmacy Consult for Heparin IV Indication: pulmonary embolus  No Known Allergies  Patient Measurements: Height: 5\' 5"  (165.1 cm) Weight: 139 lb 5.3 oz (63.2 kg) IBW/kg (Calculated) : 61.5 Heparin Dosing Weight: actual body weight  Vital Signs: Temp: 97.7 F (36.5 C) (08/13 0400) Temp Source: Oral (08/13 0400) BP: 106/52 (08/13 0330) Pulse Rate: 75 (08/13 0330)  Labs:  Recent Labs  01/05/2016 1703  01/13/16 0315  01/13/16 1820 01/14/16 0228 01/15/16 0331  HGB  --   < > 11.8*  --   --  12.4* 11.9*  HCT  --   < > 34.2*  --   --  35.5* 35.4*  PLT  --   < > 314  --   --  347 365  APTT 57*  --   --   --   --   --   --   LABPROT 15.6*  --   --   --   --   --   --   INR 1.24  --   --   --   --   --   --   HEPARINUNFRC  --   < >  --   < > 0.47 0.53 0.44  CREATININE  --   --  0.62  --   --  0.69 0.74  TROPONINI 0.05*  --   --   --   --   --   --   < > = values in this interval not displayed.  Estimated Creatinine Clearance: 73.7 mL/min (by C-G formula based on SCr of 0.8 mg/dL).   Medical History: Past Medical History:  Diagnosis Date  . Closed fracture of left wrist   . DVT   . ETOH abuse   . Hypokalemia   . Interstitial lung disease ([place])   . Pulmonary embolism     Assessment: 55 yoM with PMH EtOH abuse with cirrhosis, DVT/PE, presents 01/30/2016 with SOB and likely recurrent PE noted on CT chest. Recent admission 7/30-8/4 for acute alcoholic hepatitis.    Baseline INR = 1.24, aPTT = 57sec: elevated d/t chronic liver disease; last INR 2.03 on 8/1  Prior anticoagulation: none  H/o DVT in '09. unprovoked saddle PE '14 (xarelto stopped 9/15 as hypercoagulable w/u unrevealing  Significant events: 8/10 23:54 HL < 0.1 but heparin off d/t PIV infiltration and new line placed 8/11 LE dopplers reveal BL DVT  Today, 01/15/16:  Heparin level therapeutic on heparin infusion at 1050 units/hr  CBC: stable  Previously with noted chronic  thrombocytopenia, but Pltc currently WNL  No complications of therapy noted  Vascular surgery does not recommend IVC filter  Goal of Therapy: Heparin level 0.3-0.7 units/ml Monitor platelets by anticoagulation protocol: Yes  Plan:  Continue heparin infusion at 1050 units/hr   Daily CBC, heparin level while on heparin infusion  Monitor for s/sx of bleeding  Await plans for oral anticoauglation   Doreene Eland, PharmD, BCPS.   Pager: [phone] 01/15/2016 7:08 AM

## 2016-01-15 NOTE — Progress Notes (Signed)
PROGRESS NOTE    Sean Jackson  [id number] DOB: 01/04/1946 DOA: 01/14/2016  PCP: Aretta Nip, MD   Brief Narrative:  Sean Jackson is a 70 y.o. male with medical history significant of alcohol abuse with recent admission for alcoholic hepatitis discharged on 8/4 on steroid treatment, cirrhosis, PE in the past (Xarelto in 2014) and anemia who presents for dyspnea x 3-4 days. Found to be hypoxic in the ER. CT chest shows acute PE in right main pulmonary artery .  Subjective: No dyspnea at rest. No chest pain. No cough, nausea or vomiting.   Assessment & Plan:   Principal Problem: Pulmonary embolism - acute - 3 occurrence of thrombus - unprovoked saddle PE in 2014 with right leg DVT- h/o left leg DVT 5 yrs prior to this- he was on Xarelto which was discontinued on 9/15 as no genetic predisposition/ hypercoagulable state was found - he admits that he had been more sendentary over the past few weeks - venous duplex showing b/l DVT - previously not on 02 -  still quite hypoxic and requiring non-re breather- needed BiPAP over night for hypoxia - has underlying lung disease with fibrosis and bronchiectasis and persistent chronic PEs which may be contributing to the hypoxia  - pulmonary following- no indication for thrombolysis  Active Problems: B/l lower extremity DVT - due to sedentary lifestyle and possible underlying coagulopathic disorder- has been worked up in the past for hypercoagulable state but testing was negative - vascular surgery eval for need for IVF filter as another PE will be life threatining for him  Interstitial lung disease - has been evaluated by pulmonary recently and is being followed- not hypoxic at baseline  Moderate pulmonary HTN - due to lung disease and PEs in the past  Alcohol abuse - in remission   Acute alcoholic hepatitis - Improving - cont prednisone  Leukocytosis  - due to steroids- no infection found at this time   DVT  prophylaxis: Heparin Code Status: DNR Family Communication:  Disposition Plan: home when stable Consultants:   PCCM  Procedures:  Venous Duplex LE 01/13/16 Left: DVT noted in the common femoral vein, Prominal and distal femoral . Right:  DVT noted in the Popliteal vein and the posterior tibial vein.   8/12 ECHO Normal LV systolic function; grade 1 diastolic dysfunction; trace   MR; mild TR; mildly elevated pulmonary pressure.  Antimicrobials:  Anti-infectives    None       Objective: Vitals:   01/15/16 0126 01/15/16 0330 01/15/16 0400 01/15/16 0800  BP: (!) 93/48 (!) 106/52    Pulse: 69 75    Resp: (!) 23 (!) 29    Temp:   97.7 F (36.5 C) 98.2 F (36.8 C)  TempSrc:   Oral Oral  SpO2: 96% 93%    Weight:      Height:        Intake/Output Summary (Last 24 hours) at 01/15/16 1141 Last data filed at 01/15/16 0946  Gross per 24 hour  Intake                0 ml  Output             2100 ml  Net            -2100 ml   Filed Weights   01/21/2016 1230 01/13/16 0428 01/14/16 0400  Weight: 63.5 kg (140 lb) 66.4 kg (146 lb 6.2 oz) 63.2 kg (139 lb 5.3 oz)    Examination:  General exam: Appears comfortable  HEENT: PERRLA, oral mucosa moist, no sclera icterus or thrush Respiratory system: Clear to auscultation. Respiratory effort normal.- pulse ox 90% on 100% non-rebreather Cardiovascular system: S1 & S2 heard, RRR.  No murmurs  Gastrointestinal system: Abdomen soft, non-tender, nondistended. Normal bowel sound. No organomegaly Central nervous system: Alert and oriented. No focal neurological deficits. Extremities: No cyanosis, clubbing - left leg edema improving Skin: No rashes or ulcers Psychiatry:  Mood & affect appropriate.     Data Reviewed: I have personally reviewed following labs and imaging studies  CBC:  Recent Labs Lab 01/14/2016 1350 01/20/2016 1437 01/25/2016 1817 01/13/16 0315 01/14/16 0228 01/15/16 0331  WBC 25.2*  --  23.7* 18.5* 20.5* 18.3*    NEUTROABS 23.4*  --  20.8*  --   --   --   HGB 13.4 15.3 12.5* 11.8* 12.4* 11.9*  HCT 39.1 45.0 36.5* 34.2* 35.5* 35.4*  MCV 123.3*  --  122.9* 121.7* 121.2* 121.6*  PLT 326  --  327 314 347 [id number]   Basic Metabolic Panel:  Recent Labs Lab 01/03/2016 1350 01/08/2016 1437 01/13/16 0315 01/14/16 0228 01/15/16 0331  NA 138 139 135 134* 134*  K 4.0 3.9 4.7 5.0 4.9  CL 103 101 104 102 102  CO2 26  --  24 24 25   GLUCOSE 166* 158* 167* 199* 154*  BUN 14 13 14 19  24*  CREATININE 0.66 0.60* 0.62 0.69 0.74  CALCIUM 8.7*  --  8.1* 8.5* 8.5*   GFR: Estimated Creatinine Clearance: 73.7 mL/min (by C-G formula based on SCr of 0.8 mg/dL). Liver Function Tests:  Recent Labs Lab 02/01/2016 1350 01/14/16 0228  AST 218* 139*  ALT 325* 259*  ALKPHOS 296* 242*  BILITOT 4.3* 3.7*  PROT 7.0 6.3*  ALBUMIN 3.2* 3.0*   No results for input(s): LIPASE, AMYLASE in the last 168 hours. No results for input(s): AMMONIA in the last 168 hours. Coagulation Profile:  Recent Labs Lab 01/24/2016 1703  INR 1.24   Cardiac Enzymes:  Recent Labs Lab 01/06/2016 1703  TROPONINI 0.05*   BNP (last 3 results) No results for input(s): PROBNP in the last 8760 hours. HbA1C: No results for input(s): HGBA1C in the last 72 hours. CBG: No results for input(s): GLUCAP in the last 168 hours. Lipid Profile: No results for input(s): CHOL, HDL, LDLCALC, TRIG, CHOLHDL, LDLDIRECT in the last 72 hours. Thyroid Function Tests: No results for input(s): TSH, T4TOTAL, FREET4, T3FREE, THYROIDAB in the last 72 hours. Anemia Panel: No results for input(s): VITAMINB12, FOLATE, FERRITIN, TIBC, IRON, RETICCTPCT in the last 72 hours. Urine analysis:    Component Value Date/Time   COLORURINE AMBER (A) 05/18/2013 0830   APPEARANCEUR CLEAR 05/18/2013 0830   LABSPEC >1.046 (H) 05/18/2013 0830   PHURINE 5.5 05/18/2013 0830   GLUCOSEU NEGATIVE 05/18/2013 0830   HGBUR NEGATIVE 05/18/2013 0830   BILIRUBINUR NEGATIVE 05/18/2013 0830    KETONESUR NEGATIVE 05/18/2013 0830   PROTEINUR NEGATIVE 05/18/2013 0830   UROBILINOGEN 1.0 05/18/2013 0830   NITRITE NEGATIVE 05/18/2013 0830   LEUKOCYTESUR NEGATIVE 05/18/2013 0830   Sepsis Labs: @LABRCNTIP (procalcitonin:4,lacticidven:4) )No results found for this or any previous visit (from the past 240 hour(s)).       Radiology Studies: Dg Chest Port 1 View  Result Date: 01/14/2016 CLINICAL DATA:  Respiratory failure.  Pulmonary embolism. EXAM: PORTABLE CHEST 1 VIEW COMPARISON:  01/14/2016 FINDINGS: Progression of left lower lobe airspace disease. Progression of small left pleural effusion. Right lung remains clear.  Negative  for edema. IMPRESSION: Progression of left lower lobe atelectasis/ infiltrate and left pleural effusion. Electronically Signed   By: Franchot Gallo M.D.   On: 01/14/2016 07:17      Scheduled Meds: . magnesium oxide  200 mg Oral Daily  . multivitamin with minerals  1 tablet Oral Daily  . pantoprazole  40 mg Oral BID  . potassium chloride SA  20 mEq Oral Daily  . predniSONE  20 mg Oral BID WC  . sodium chloride flush  3 mL Intravenous Q12H   Continuous Infusions: . heparin 1,050 Units/hr (01/15/16 0841)     LOS: 3 days    Time spent in minutes: 26    [place], MD [facility] Pager: [email or web address] Password [facility] 01/15/2016, 11:41 AM

## 2016-01-15 NOTE — Progress Notes (Signed)
Called to assess patient for oxygenation issues. Sats kept falling into the 80s with patient on NRB. Pt placed on BIPAP,per order, sats rebounding into the 90s. Patient is in no distress, but is being treated for PE and DVT. Will continue to monitor

## 2016-01-15 NOTE — Progress Notes (Addendum)
*  PRELIMINARY RESULTS* Vascular Ultrasound IVC and bilateral iliac vein duplex has been completed.  Study was technically difficult due to overlying bowel gas.  The IVC appears to be patent by color Doppler and pulsed wave Doppler throughout the visualized segments.  The right common iliac vein and right external iliac vein appear to be patent by color Doppler and pulsed wave Doppler throughout their visualized segments.  The left common iliac vein and left external iliac vein do not exhibit flow by color Doppler, with very minimal flow versus artifact by pulsed wave Doppler. This is suggestive of possible occlusion versus possible partial thrombus.  Preliminary results discussed with Maudie Mercury, RN and Dr. [name].  01/15/2016 8:41 AM Maudry Mayhew, BS, RVT, RDCS, RDMS.

## 2016-01-16 DIAGNOSIS — J9600 Acute respiratory failure, unspecified whether with hypoxia or hypercapnia: Secondary | ICD-10-CM

## 2016-01-16 LAB — BASIC METABOLIC PANEL
ANION GAP: 7 (ref 5–15)
Anion gap: 6 (ref 5–15)
BUN: 24 mg/dL — AB (ref 6–20)
BUN: 18 mg/dL (ref 6–20)
CALCIUM: 8.6 mg/dL — AB (ref 8.9–10.3)
CALCIUM: 8.5 mg/dL — AB (ref 8.9–10.3)
CO2: 23 mmol/L (ref 22–32)
CO2: 25 mmol/L (ref 22–32)
CREATININE: 0.7 mg/dL (ref 0.61–1.24)
Chloride: 104 mmol/L (ref 101–111)
Chloride: 104 mmol/L (ref 101–111)
Creatinine, Ser: 0.6 mg/dL — ABNORMAL LOW (ref 0.61–1.24)
GFR calc Af Amer: >60 mL/min (ref >60)
GFR calc non Af Amer: >60 mL/min (ref >60)
Glucose, Bld: 259 mg/dL — ABNORMAL HIGH (ref 65–99)
Glucose, Bld: 211 mg/dL — ABNORMAL HIGH (ref 65–99)
POTASSIUM: 5.6 mmol/L — AB (ref 3.5–5.1)
Potassium: 4.1 mmol/L (ref 3.5–5.1)
SODIUM: 134 mmol/L — AB (ref 135–145)
SODIUM: 135 mmol/L (ref 135–145)

## 2016-01-16 LAB — BLOOD GAS, ARTERIAL
ACID-BASE EXCESS: 1.5 mmol/L (ref 0.0–2.0)
Bicarbonate: 24.8 mEq/L — ABNORMAL HIGH (ref 20.0–24.0)
DRAWN BY: 275531
O2 CONTENT: 15 L/min
O2 Saturation: 86.4 %
Patient temperature: 98.6
TCO2: 20.9 mmol/L (ref 0–100)
pCO2 arterial: 37 mmHg (ref 35.0–45.0)
pH, Arterial: 7.442 (ref 7.350–7.450)
pO2, Arterial: 57.3 mmHg — ABNORMAL LOW (ref 80.0–100.0)

## 2016-01-16 LAB — CBC
HCT: 37 % — ABNORMAL LOW (ref 39.0–52.0)
Hemoglobin: 12.3 g/dL — ABNORMAL LOW (ref 13.0–17.0)
MCH: 40.5 pg — AB (ref 26.0–34.0)
MCHC: 33.2 g/dL (ref 30.0–36.0)
MCV: 121.7 fL — ABNORMAL HIGH (ref 78.0–100.0)
PLATELETS: 342 10*3/uL (ref 150–400)
RBC: 3.04 MIL/uL — AB (ref 4.22–5.81)
RDW: 16.9 % — AB (ref 11.5–15.5)
WBC: 18.8 10*3/uL — AB (ref 4.0–10.5)

## 2016-01-16 LAB — HEPARIN LEVEL (UNFRACTIONATED): Heparin Unfractionated: 0.37 IU/mL (ref 0.30–0.70)

## 2016-01-16 MED ORDER — APIXABAN 5 MG PO TABS
10.0000 mg | ORAL_TABLET | Freq: Two times a day (BID) | ORAL | Status: AC
  Administered 2016-01-16 – 2016-01-22 (×14): 10 mg via ORAL
  Filled 2016-01-16 (×14): qty 2

## 2016-01-16 MED ORDER — SODIUM CHLORIDE 0.9 % IV BOLUS (SEPSIS)
1000.0000 mL | Freq: Once | INTRAVENOUS | Status: AC
  Administered 2016-01-16: 1000 mL via INTRAVENOUS

## 2016-01-16 MED ORDER — CETYLPYRIDINIUM CHLORIDE 0.05 % MT LIQD
7.0000 mL | Freq: Two times a day (BID) | OROMUCOSAL | Status: DC
  Administered 2016-01-17 – 2016-01-25 (×16): 7 mL via OROMUCOSAL

## 2016-01-16 MED ORDER — SODIUM POLYSTYRENE SULFONATE 15 GM/60ML PO SUSP
30.0000 g | Freq: Once | ORAL | Status: AC
  Administered 2016-01-16: 30 g via ORAL
  Filled 2016-01-16: qty 120

## 2016-01-16 MED ORDER — APIXABAN 5 MG PO TABS
5.0000 mg | ORAL_TABLET | Freq: Two times a day (BID) | ORAL | Status: DC
  Administered 2016-01-23: 5 mg via ORAL
  Filled 2016-01-16: qty 1

## 2016-01-16 NOTE — Progress Notes (Signed)
PULMONARY / CRITICAL CARE MEDICINE   Name: FINDLEY EWTON MRN: [id number] DOB: 1946/04/13    ADMISSION DATE:  01/05/2016 CONSULTATION DATE:  01/13/2016  REFERRING MD:  Dr. Wynelle Cleveland, [facility]  CHIEF COMPLAINT:  Short of breath  SUBJECTIVE:  On less Fio2 now. Currently on 5L O2. Comfortable. On and off cough. (-) cp. (-) other subj complaints.   VITAL SIGNS: BP 119/60   Pulse 91   Temp 97.5 F (36.4 C) (Axillary)   Resp (!) 23   Ht 5\' 5"  (1.651 m)   Wt 63.2 kg (139 lb 5.3 oz)   SpO2 95%   BMI 23.19 kg/m   INTAKE / OUTPUT: I/O last 3 completed shifts: In: 1353.6 [P.O.:480; I.V.:873.6] Out: 2850 [Urine:2850]  PHYSICAL EXAMINATION: General: pleasant, speaks in full sentences w/o accessory muscle use Neuro:  Alert, normal strength, CN intact HEENT:  Pupils reactive, mild scleral icterus, no LAN Cardiovascular:  Regular, no murmur Lungs:  Basilar crackles, no wheeze. Good ae. (-) accessory muscle use. (-) alar flaring.  Abdomen:  Soft, non tender, + bowel sounds Musculoskeletal:  Mild Lt > Rt edema Skin:  Dry skin  LABS:  BMET  Recent Labs Lab 01/14/16 0228 01/15/16 0331 01/16/16 0403  NA 134* 134* 134*  K 5.0 4.9 5.6*  CL 102 102 104  CO2 24 25 23   BUN 19 24* 24*  CREATININE 0.69 0.74 0.60*  GLUCOSE 199* 154* 259*    Electrolytes  Recent Labs Lab 01/14/16 0228 01/15/16 0331 01/16/16 0403  CALCIUM 8.5* 8.5* 8.6*    CBC  Recent Labs Lab 01/14/16 0228 01/15/16 0331 01/16/16 0403  WBC 20.5* 18.3* 18.8*  HGB 12.4* 11.9* 12.3*  HCT 35.5* 35.4* 37.0*  PLT 347 365 342    Coag's  Recent Labs Lab 01/28/2016 1703  APTT 57*  INR 1.24    Sepsis Markers No results for input(s): LATICACIDVEN, PROCALCITON, O2SATVEN in the last 168 hours.  ABG  Recent Labs Lab 01/28/2016 1305 01/16/16 0836  PHART 7.522* 7.442  PCO2ART 29.6* 37.0  PO2ART 88.4 57.3*    Liver Enzymes  Recent Labs Lab 02/01/2016 1350 01/14/16 0228  AST 218* 139*  ALT 325*  259*  ALKPHOS 296* 242*  BILITOT 4.3* 3.7*  ALBUMIN 3.2* 3.0*    Cardiac Enzymes  Recent Labs Lab 01/17/2016 1703  TROPONINI 0.05*    Glucose No results for input(s): GLUCAP in the last 168 hours.  Imaging No results found.   STUDIES:  12/13/15 PFT >> FEV1 1.90 (73%), FEV1% 85, TLC 4.28 (71%), DLCO 39% 01/23/2016 CT angio chest >> PE Rt main PA, chronic PE Rt upper/middle/lower lobes, chronic PE Lt PA, RV:LV ratio 1, basilar BTX and interstitial changes b/l, Lt lung areas of GGO 01/13/16 Echo >> mild LVH, EF 55 to 123456, grade 1 diastolic CHF, mild increase PA pressures 01/13/16 Doppler legs b/l >> Lt common femoral DVT, Rt popliteal and posterior tibial DVT  SIGNIFICANT EVENTS: 8/10 Admit, start heparin gtt  LINES/TUBES:  DISCUSSION: 70 yo male with fatigue, nausea/vomiting, dyspnea from acute on chronic PE with b/l leg DVT.  He has recent admission for alcoholic hepatitis.  He is followed by Dr. Lamonte Sakai for ILD, and has new areas of GGO in LLL on CT chest.  He has persistent hypoxemia.  He is DNR/DNI.  ASSESSMENT / PLAN:  PULMONARY A: Acute hypoxic respiratory failure. GGO Lt lung on CT chest >> likely from relative hyperemia in setting of acute PE in Rt PA. Basilar predominant ILD with  BTX. P:   Continue supplemental oxygen to keep SpO2 > 90-92%. NRBM weaned off and now is on 5L HFNC and o2 sats are > 90%. He is comfortable.  F/u CXR intermittently > if he is still requiring high o2 in 24 hrs, will rpt CXR in am.   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 P:  On heparin drip. Per Primary team. Anticipate will overlap with anticoagulant and he needs to be on [place] or NOAC indefinitley.  Monitor hemodynamics  RENAL A:   No acute issues. P:   Monitor renal fx, urine outpt, electrolytes  GASTROINTESTINAL A:   Alcoholic hepatitis. P:   Prednisone per primary team  HEMATOLOGIC A:   Leukocytosis. P:  F/u CBC  INFECTIOUS A:   No  evidence for infection. P:   Monitor clinically  ENDOCRINE A:   No acute issues.   P:   Monitor blood sugar on BMET  NEUROLOGIC A:   No acute issues. P:   Monitor mental status   No family around. Pt updated of plan.    Monica Becton, MD 01/16/2016, 10:34 AM [facility] Pager [phone] After 3 pm or if no answer, call [phone]

## 2016-01-16 NOTE — Progress Notes (Signed)
[place] for Heparin IV - transition to PO apixaban 8/14 Indication: pulmonary embolus  No Known Allergies  Patient Measurements: Height: 5\' 5"  (165.1 cm) Weight: 139 lb 5.3 oz (63.2 kg) IBW/kg (Calculated) : 61.5 Heparin Dosing Weight: actual body weight  Vital Signs: Temp: 97.6 F (36.4 C) (08/14 1133) Temp Source: Oral (08/14 1133) BP: 108/50 (08/14 0900) Pulse Rate: 95 (08/14 0900)  Labs:  Recent Labs  01/14/16 0228 01/15/16 0331 01/16/16 0403  HGB 12.4* 11.9* 12.3*  HCT 35.5* 35.4* 37.0*  PLT 347 365 342  HEPARINUNFRC 0.53 0.44 0.37  CREATININE 0.69 0.74 0.60*    Estimated Creatinine Clearance: 73.7 mL/min (by C-G formula based on SCr of 0.8 mg/dL).   Medical History: Past Medical History:  Diagnosis Date  . Closed fracture of left wrist   . DVT   . ETOH abuse   . Hypokalemia   . Interstitial lung disease ([place])   . Pulmonary embolism     Assessment: 77 yoM with PMH EtOH abuse with cirrhosis, DVT/PE, presents 01/22/2016 with SOB and likely recurrent PE noted on CT chest. Recent admission 7/30-8/4 for acute alcoholic hepatitis.    Baseline INR = 1.24, aPTT = 57sec: elevated d/t chronic liver disease; last INR 2.03 on 8/1  Prior anticoagulation: none  H/o DVT in '09. unprovoked saddle PE '14 (xarelto stopped 9/15 as hypercoagulable w/u unrevealing  Significant events: 8/10 23:54 HL < 0.1 but heparin off d/t PIV infiltration and new line placed 8/11 LE dopplers reveal BL DVT  Today, 01/16/16:  Will d/c IV heparin and start apixaban at time of discontinuation of heparin  CBC: stable  Previously with noted chronic thrombocytopenia, but Pltc currently WNL  No complications of therapy noted  Vascular surgery does not recommend IVC filter  Goal of Therapy: Heparin level 0.3-0.7 units/ml Monitor platelets by anticoagulation protocol: Yes  Plan:  Discontinue IV heparin now  Start apixaban 10mg  PO BID x 7  days then switch to 5mg  PO BID thereafter  Daily CBC, monitor renal function  Monitor for s/sx of bleeding  Will educate patient about apixaban and provide materials prior to discharge  Adrian Saran, PharmD, BCPS Pager [phone] 01/16/2016 12:39 PM

## 2016-01-16 NOTE — Progress Notes (Signed)
Inpatient Diabetes Program Recommendations  AACE/ADA: New Consensus Statement on Inpatient Glycemic Control (2015)  Target Ranges:  Prepandial:   less than 140 mg/dL      Peak postprandial:   less than 180 mg/dL (1-2 hours)      Critically ill patients:  140 - 180 mg/dL   Results for RUDRANSH, ANNEN (MRN [id number]) as of 01/16/2016 10:26  Ref. Range 01/14/2016 02:28 01/15/2016 03:31 01/16/2016 04:03  Glucose Latest Ref Range: 65 - 99 mg/dL 199 (H) 154 (H) 259 (H)    Admit with: PE  History: ETOH Abuse    -Patient currently receiving Prednisone 20 mg bid.  -Elevated lab glucose noted on 08/12 and 08/14.     MD- Please consider placing order for CBG checks TID AC + HS and cover with Novolog Sensitive Correction Scale/ SSI (0-9 units) TID AC + HS if CBGs are elevated.     --Will follow patient during hospitalization--  Wyn Quaker RN, MSN, CDE Diabetes Coordinator Inpatient Glycemic Control Team Team Pager: [phone] (8a-5p)

## 2016-01-16 NOTE — Progress Notes (Signed)
ANTICOAGULATION CONSULT NOTE  Pharmacy Consult for Heparin IV Indication: pulmonary embolus  No Known Allergies  Patient Measurements: Height: 5\' 5"  (165.1 cm) Weight: 139 lb 5.3 oz (63.2 kg) IBW/kg (Calculated) : 61.5 Heparin Dosing Weight: actual body weight  Vital Signs: Temp: 97.5 F (36.4 C) (08/14 0722) Temp Source: Axillary (08/14 0722) BP: 119/60 (08/14 0700) Pulse Rate: 91 (08/14 0700)  Labs:  Recent Labs  01/14/16 0228 01/15/16 0331 01/16/16 0403  HGB 12.4* 11.9* 12.3*  HCT 35.5* 35.4* 37.0*  PLT 347 365 342  HEPARINUNFRC 0.53 0.44 0.37  CREATININE 0.69 0.74 0.60*    Estimated Creatinine Clearance: 73.7 mL/min (by C-G formula based on SCr of 0.8 mg/dL).   Medical History: Past Medical History:  Diagnosis Date  . Closed fracture of left wrist   . DVT   . ETOH abuse   . Hypokalemia   . Interstitial lung disease ([place])   . Pulmonary embolism     Assessment: 35 yoM with PMH EtOH abuse with cirrhosis, DVT/PE, presents 01/28/2016 with SOB and likely recurrent PE noted on CT chest. Recent admission 7/30-8/4 for acute alcoholic hepatitis.    Baseline INR = 1.24, aPTT = 57sec: elevated d/t chronic liver disease; last INR 2.03 on 8/1  Prior anticoagulation: none  H/o DVT in '09. unprovoked saddle PE '14 (xarelto stopped 9/15 as hypercoagulable w/u unrevealing  Significant events: 8/10 23:54 HL < 0.1 but heparin off d/t PIV infiltration and new line placed 8/11 LE dopplers reveal BL DVT  Today, 01/16/16:  Heparin level continues to be therapeutic on heparin infusion at rate of 1050 units/hr  CBC: stable  Previously with noted chronic thrombocytopenia, but Pltc currently WNL  No complications of therapy noted  Vascular surgery does not recommend IVC filter  Goal of Therapy: Heparin level 0.3-0.7 units/ml Monitor platelets by anticoagulation protocol: Yes  Plan:  Continue heparin infusion at 1050 units/hr   Daily CBC, heparin level while on  heparin infusion  Monitor for s/sx of bleeding  Await plans for oral anticoauglation   Adrian Saran, PharmD, BCPS Pager [phone] 01/16/2016 10:10 AM

## 2016-01-16 NOTE — Progress Notes (Signed)
PROGRESS NOTE    Sean Jackson  [id number] DOB: 21-Oct-1945 DOA: 01/25/2016  PCP: Aretta Nip, MD   Brief Narrative:  Sean Jackson is a 70 y.o. male with medical history significant of alcohol abuse with recent admission for alcoholic hepatitis discharged on 8/4 on steroid treatment, cirrhosis, PE in the past (Xarelto in 2014) and anemia who presents for dyspnea x 3-4 days. Found to be hypoxic in the ER. CT chest shows acute PE in right main pulmonary artery .  Subjective: No dyspnea at rest. No chest pain. No cough, nausea or vomiting.   Assessment & Plan:   Principal Problem: Pulmonary embolism - acute - 3 occurrence of thrombus - unprovoked saddle PE in 2014 with right leg DVT- h/o left leg DVT 5 yrs prior to this- he was on Xarelto which was discontinued on 9/15 as no genetic predisposition/ hypercoagulable state was found - he admits that he had been more sendentary over the past few weeks - venous duplex showing b/l DVT - previously not on 02 -  still quite hypoxic and requiring non-re breather- needed BiPAP at one point for hypoxia - attempting to wean to nasal cannula- on high flow O2 now- may need non-rebreather - get OOB today- cont to follow in SDU - has underlying lung disease with fibrosis and bronchiectasis and persistent chronic PEs which may be contributing to the hypoxia  - pulmonary following- no indication for thrombolysis - switch to Eliquis today  Active Problems: B/l lower extremity DVT - due to sedentary lifestyle and possible underlying coagulopathic disorder- has been worked up in the past for hypercoagulable state but testing was negative - vascular surgery consulted for need for IVF filter as another PE will be life threatining for him- they do not feel he will benefit from a filter  Interstitial lung disease - has been evaluated by pulmonary recently and is being followed- not hypoxic at baseline  Moderate pulmonary HTN - due to lung  disease and PEs in the past  Alcohol abuse - in remission   Acute alcoholic hepatitis - Improving - cont prednisone  Leukocytosis  - due to steroids- no infection found at this time   DVT prophylaxis: Heparin Code Status: DNR Family Communication:  Disposition Plan: home when stable Consultants:   PCCM  Procedures:  Venous Duplex LE 01/13/16 Left: DVT noted in the common femoral vein, Prominal and distal femoral . Right:  DVT noted in the Popliteal vein and the posterior tibial vein.   8/12 ECHO Normal LV systolic function; grade 1 diastolic dysfunction; trace   MR; mild TR; mildly elevated pulmonary pressure.  Antimicrobials:  Anti-infectives    None       Objective: Vitals:   01/16/16 0722 01/16/16 0800 01/16/16 0900 01/16/16 1133  BP:  (!) 82/41 (!) 108/50   Pulse:  76 95   Resp:  (!) 24 (!) 29   Temp: 97.5 F (36.4 C)   97.6 F (36.4 C)  TempSrc: Axillary   Oral  SpO2:  94% 93%   Weight:      Height:        Intake/Output Summary (Last 24 hours) at 01/16/16 1222 Last data filed at 01/16/16 0933  Gross per 24 hour  Intake          1833.61 ml  Output             1200 ml  Net           633.61 ml  Filed Weights   01/30/2016 1230 01/13/16 0428 01/14/16 0400  Weight: 63.5 kg (140 lb) 66.4 kg (146 lb 6.2 oz) 63.2 kg (139 lb 5.3 oz)    Examination: General exam: Appears comfortable  HEENT: PERRLA, oral mucosa moist, no sclera icterus or thrush Respiratory system: Clear to auscultation. Respiratory effort normal.- pulse ox 90% on 100% non-rebreather Cardiovascular system: S1 & S2 heard, RRR.  No murmurs  Gastrointestinal system: Abdomen soft, non-tender, nondistended. Normal bowel sound. No organomegaly Central nervous system: Alert and oriented. No focal neurological deficits. Extremities: No cyanosis, clubbing - left leg edema improving Skin: No rashes or ulcers Psychiatry:  Mood & affect appropriate.     Data Reviewed: I have personally reviewed  following labs and imaging studies  CBC:  Recent Labs Lab 01/03/2016 1350  01/17/2016 1817 01/13/16 0315 01/14/16 0228 01/15/16 0331 01/16/16 0403  WBC 25.2*  --  23.7* 18.5* 20.5* 18.3* 18.8*  NEUTROABS 23.4*  --  20.8*  --   --   --   --   HGB 13.4  < > 12.5* 11.8* 12.4* 11.9* 12.3*  HCT 39.1  < > 36.5* 34.2* 35.5* 35.4* 37.0*  MCV 123.3*  --  122.9* 121.7* 121.2* 121.6* 121.7*  PLT 326  --  327 314 347 365 342  < > = values in this interval not displayed. Basic Metabolic Panel:  Recent Labs Lab 01/03/2016 1350 01/08/2016 1437 01/13/16 0315 01/14/16 0228 01/15/16 0331 01/16/16 0403  NA 138 139 135 134* 134* 134*  K 4.0 3.9 4.7 5.0 4.9 5.6*  CL 103 101 104 102 102 104  CO2 26  --  24 24 25 23   GLUCOSE 166* 158* 167* 199* 154* 259*  BUN 14 13 14 19  24* 24*  CREATININE 0.66 0.60* 0.62 0.69 0.74 0.60*  CALCIUM 8.7*  --  8.1* 8.5* 8.5* 8.6*   GFR: Estimated Creatinine Clearance: 73.7 mL/min (by C-G formula based on SCr of 0.8 mg/dL). Liver Function Tests:  Recent Labs Lab 01/08/2016 1350 01/14/16 0228  AST 218* 139*  ALT 325* 259*  ALKPHOS 296* 242*  BILITOT 4.3* 3.7*  PROT 7.0 6.3*  ALBUMIN 3.2* 3.0*   No results for input(s): LIPASE, AMYLASE in the last 168 hours. No results for input(s): AMMONIA in the last 168 hours. Coagulation Profile:  Recent Labs Lab 01/29/2016 1703  INR 1.24   Cardiac Enzymes:  Recent Labs Lab 01/22/2016 1703  TROPONINI 0.05*   BNP (last 3 results) No results for input(s): PROBNP in the last 8760 hours. HbA1C: No results for input(s): HGBA1C in the last 72 hours. CBG: No results for input(s): GLUCAP in the last 168 hours. Lipid Profile: No results for input(s): CHOL, HDL, LDLCALC, TRIG, CHOLHDL, LDLDIRECT in the last 72 hours. Thyroid Function Tests: No results for input(s): TSH, T4TOTAL, FREET4, T3FREE, THYROIDAB in the last 72 hours. Anemia Panel: No results for input(s): VITAMINB12, FOLATE, FERRITIN, TIBC, IRON, RETICCTPCT in  the last 72 hours. Urine analysis:    Component Value Date/Time   COLORURINE AMBER (A) 05/18/2013 0830   APPEARANCEUR CLEAR 05/18/2013 0830   LABSPEC >1.046 (H) 05/18/2013 0830   PHURINE 5.5 05/18/2013 0830   GLUCOSEU NEGATIVE 05/18/2013 0830   HGBUR NEGATIVE 05/18/2013 0830   BILIRUBINUR NEGATIVE 05/18/2013 0830   KETONESUR NEGATIVE 05/18/2013 0830   PROTEINUR NEGATIVE 05/18/2013 0830   UROBILINOGEN 1.0 05/18/2013 0830   NITRITE NEGATIVE 05/18/2013 0830   LEUKOCYTESUR NEGATIVE 05/18/2013 0830   Sepsis Labs: @LABRCNTIP (procalcitonin:4,lacticidven:4) )No results found for this  or any previous visit (from the past 240 hour(s)).       Radiology Studies: No results found.    Scheduled Meds: . magnesium oxide  200 mg Oral Daily  . multivitamin with minerals  1 tablet Oral Daily  . pantoprazole  40 mg Oral BID  . predniSONE  20 mg Oral BID WC  . sodium chloride flush  3 mL Intravenous Q12H   Continuous Infusions: . heparin 1,050 Units/hr (01/16/16 0453)     LOS: 4 days    Time spent in minutes: [place], MD [facility] Pager: [email or web address] Password [facility] 01/16/2016, 12:22 PM

## 2016-01-17 ENCOUNTER — Inpatient Hospital Stay (HOSPITAL_COMMUNITY): Payer: Medicare Other

## 2016-01-17 ENCOUNTER — Ambulatory Visit: Payer: Self-pay | Admitting: *Deleted

## 2016-01-17 LAB — CBC
HCT: 36.1 % — ABNORMAL LOW (ref 39.0–52.0)
HEMOGLOBIN: 12 g/dL — AB (ref 13.0–17.0)
MCH: 39.9 pg — AB (ref 26.0–34.0)
MCHC: 33.2 g/dL (ref 30.0–36.0)
MCV: 119.9 fL — AB (ref 78.0–100.0)
Platelets: 349 10*3/uL (ref 150–400)
RBC: 3.01 MIL/uL — AB (ref 4.22–5.81)
RDW: 16.4 % — ABNORMAL HIGH (ref 11.5–15.5)
WBC: 18.2 10*3/uL — ABNORMAL HIGH (ref 4.0–10.5)

## 2016-01-17 LAB — PROCALCITONIN: Procalcitonin: 0.16 ng/mL

## 2016-01-17 LAB — BASIC METABOLIC PANEL
Anion gap: 8 (ref 5–15)
BUN: 19 mg/dL (ref 6–20)
CHLORIDE: 104 mmol/L (ref 101–111)
CO2: 25 mmol/L (ref 22–32)
Calcium: 8.5 mg/dL — ABNORMAL LOW (ref 8.9–10.3)
Creatinine, Ser: 0.57 mg/dL — ABNORMAL LOW (ref 0.61–1.24)
GFR calc Af Amer: >60 mL/min (ref >60)
GFR calc non Af Amer: >60 mL/min (ref >60)
GLUCOSE: 246 mg/dL — AB (ref 65–99)
POTASSIUM: 4.2 mmol/L (ref 3.5–5.1)
SODIUM: 137 mmol/L (ref 135–145)

## 2016-01-17 NOTE — Progress Notes (Signed)
PROGRESS NOTE    Sean Jackson  [id number] DOB: 1946/03/23 DOA: 01/23/2016  PCP: Aretta Nip, MD   Brief Narrative:  Sean Jackson is a 70 y.o. male with medical history significant of alcohol abuse with recent admission for alcoholic hepatitis discharged on 8/4 on steroid treatment, cirrhosis, PE in the past (Xarelto in 2014) and anemia who presents for dyspnea x 3-4 days. Found to be hypoxic in the ER. CT chest shows acute PE in right main pulmonary artery . His disposition is difficult as he continues to require high levels of O2.   Subjective: No dyspnea at rest. No chest pain. No cough, nausea or vomiting.   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- h/o left leg DVT 5 yrs prior to this- he was on Xarelto which was discontinued on 9/15 by his pulmonologist as no genetic predisposition/ hypercoagulable state was found - he admits that he had been more sendentary over the past few weeks - venous duplex of legs and b/l IVC showing b/l DVT including in left iliac vein - previously not on 02 at home -  still quite hypoxic and requiring non-re breather- needed BiPAP at one point for hypoxia - attempting to wean to nasal cannula but continues to need venti mask or high flow O2 especially when getting out of bed -  cont to follow in SDU- Soil scientist of the unit has asked that we change his level of care to ICU - has underlying lung disease with fibrosis and bronchiectasis and persistent chronic PEs which may be contributing to the hypoxia  - pulmonary following- no indication for thrombolysis - switched to Eliquis which he is tolerating- anticoagulation needs to be life long  Active Problems: B/l lower extremity DVT - due to sedentary lifestyle and possible underlying coagulopathic disorder- has been worked up in the past for hypercoagulable state  but testing was negative - vascular surgery consulted for need for IVF filter as another PE will be life threatining for him- they do not feel he will benefit from a filter  Interstitial lung disease - has been evaluated by pulmonary recently and is being followed- not hypoxic at baseline  Moderate pulmonary HTN - due to lung disease and PEs in the past  Alcohol abuse - in remission   Acute alcoholic hepatitis - Improving - cont prednisone which he was started on when last admitted  Leukocytosis  - due to steroids- no infection found at this time   DVT prophylaxis: Eliquis Code Status: DNR Family Communication:  Disposition Plan: home vs SNF when stable Consultants:   PCCM  Procedures:  Venous Duplex LE 01/13/16 Left: DVT noted in the common femoral vein, Prominal and distal femoral . Right:  DVT noted in the Popliteal vein and the posterior tibial vein.   8/12 ECHO Normal LV systolic function; grade 1 diastolic dysfunction; trace   MR; mild TR; mildly elevated pulmonary pressure.  Antimicrobials:  Anti-infectives    None       Objective: Vitals:   01/17/16 1000 01/17/16 1015 01/17/16 1100 01/17/16 1200  BP: 119/66 119/66 (!) 96/45 (!) 109/56  Pulse: 90 91 82 76  Resp: (!) 36 (!) 26 (!) 27 (!) 25  Temp:    98.9 F (37.2 C)  TempSrc:    Core (Comment)  SpO2: 90% 90% (!) 87% 96%  Weight:  Height:        Intake/Output Summary (Last 24 hours) at 01/17/16 1348 Last data filed at 01/17/16 1200  Gross per 24 hour  Intake              590 ml  Output             1400 ml  Net             -810 ml   Filed Weights   01/23/2016 1230 01/13/16 0428 01/14/16 0400  Weight: 63.5 kg (140 lb) 66.4 kg (146 lb 6.2 oz) 63.2 kg (139 lb 5.3 oz)    Examination: General exam: Appears comfortable  HEENT: PERRLA, oral mucosa moist, no sclera icterus or thrush Respiratory system: Clear to auscultation. Respiratory effort normal.- pulse ox 90% on 100%  non-rebreather Cardiovascular system: S1 & S2 heard, RRR.  No murmurs  Gastrointestinal system: Abdomen soft, non-tender, nondistended. Normal bowel sound. No organomegaly Central nervous system: Alert and oriented. No focal neurological deficits. Extremities: No cyanosis, clubbing - left leg edema improving Skin: No rashes or ulcers Psychiatry:  Mood & affect appropriate.     Data Reviewed: I have personally reviewed following labs and imaging studies  CBC:  Recent Labs Lab 02/02/2016 1350  02/02/2016 1817 01/13/16 0315 01/14/16 0228 01/15/16 0331 01/16/16 0403 01/17/16 0343  WBC 25.2*  --  23.7* 18.5* 20.5* 18.3* 18.8* 18.2*  NEUTROABS 23.4*  --  20.8*  --   --   --   --   --   HGB 13.4  < > 12.5* 11.8* 12.4* 11.9* 12.3* 12.0*  HCT 39.1  < > 36.5* 34.2* 35.5* 35.4* 37.0* 36.1*  MCV 123.3*  --  122.9* 121.7* 121.2* 121.6* 121.7* 119.9*  PLT 326  --  327 314 347 365 342 349  < > = values in this interval not displayed. Basic Metabolic Panel:  Recent Labs Lab 01/14/16 0228 01/15/16 0331 01/16/16 0403 01/16/16 1705 01/17/16 0343  NA 134* 134* 134* 135 137  K 5.0 4.9 5.6* 4.1 4.2  CL 102 102 104 104 104  CO2 24 25 23 25 25   GLUCOSE 199* 154* 259* 211* 246*  BUN 19 24* 24* 18 19  CREATININE 0.69 0.74 0.60* 0.70 0.57*  CALCIUM 8.5* 8.5* 8.6* 8.5* 8.5*   GFR: Estimated Creatinine Clearance: 73.7 mL/min (by C-G formula based on SCr of 0.8 mg/dL). Liver Function Tests:  Recent Labs Lab 01/23/2016 1350 01/14/16 0228  AST 218* 139*  ALT 325* 259*  ALKPHOS 296* 242*  BILITOT 4.3* 3.7*  PROT 7.0 6.3*  ALBUMIN 3.2* 3.0*   No results for input(s): LIPASE, AMYLASE in the last 168 hours. No results for input(s): AMMONIA in the last 168 hours. Coagulation Profile:  Recent Labs Lab 02/02/2016 1703  INR 1.24   Cardiac Enzymes:  Recent Labs Lab 01/10/2016 1703  TROPONINI 0.05*   BNP (last 3 results) No results for input(s): PROBNP in the last 8760 hours. HbA1C: No  results for input(s): HGBA1C in the last 72 hours. CBG: No results for input(s): GLUCAP in the last 168 hours. Lipid Profile: No results for input(s): CHOL, HDL, LDLCALC, TRIG, CHOLHDL, LDLDIRECT in the last 72 hours. Thyroid Function Tests: No results for input(s): TSH, T4TOTAL, FREET4, T3FREE, THYROIDAB in the last 72 hours. Anemia Panel: No results for input(s): VITAMINB12, FOLATE, FERRITIN, TIBC, IRON, RETICCTPCT in the last 72 hours. Urine analysis:    Component Value Date/Time   COLORURINE AMBER (A) 05/18/2013 0830   APPEARANCEUR  CLEAR 05/18/2013 0830   LABSPEC >1.046 (H) 05/18/2013 0830   PHURINE 5.5 05/18/2013 0830   GLUCOSEU NEGATIVE 05/18/2013 0830   HGBUR NEGATIVE 05/18/2013 0830   BILIRUBINUR NEGATIVE 05/18/2013 0830   KETONESUR NEGATIVE 05/18/2013 0830   PROTEINUR NEGATIVE 05/18/2013 0830   UROBILINOGEN 1.0 05/18/2013 0830   NITRITE NEGATIVE 05/18/2013 0830   LEUKOCYTESUR NEGATIVE 05/18/2013 0830   Sepsis Labs: @LABRCNTIP (procalcitonin:4,lacticidven:4) )No results found for this or any previous visit (from the past 240 hour(s)).       Radiology Studies: Dg Chest Port 1 View  Result Date: 01/17/2016 CLINICAL DATA:  History of known PE and left lower lobe infiltrate. EXAM: PORTABLE CHEST 1 VIEW COMPARISON:  01/14/2016 FINDINGS: Cardiac shadow is stable. Persistent left basilar infiltrate is noted. The right lung remains clear. No sizable effusion is noted. IMPRESSION: Stable left basilar infiltrate. Electronically Signed   By: Inez Catalina M.D.   On: 01/17/2016 10:47      Scheduled Meds: . antiseptic oral rinse  7 mL Mouth Rinse BID  . apixaban  10 mg Oral BID   Followed by  . [START ON 01/23/2016] apixaban  5 mg Oral BID  . magnesium oxide  200 mg Oral Daily  . multivitamin with minerals  1 tablet Oral Daily  . pantoprazole  40 mg Oral BID  . predniSONE  20 mg Oral BID WC  . sodium chloride flush  3 mL Intravenous Q12H   Continuous Infusions:     LOS:  5 days    Time spent in minutes: [place], MD [facility] Pager: [email or web address] Password [email or web address] 01/17/2016, 1:48 PM

## 2016-01-17 NOTE — Progress Notes (Signed)
Decrease FIO2 to 8L HFNC due to stable sats. Pt in no distress

## 2016-01-17 NOTE — Progress Notes (Signed)
Pt OOB to bedside commode with 2 assist. 15 L HFNC and 15L NRB on pt from pivot to [facility]. Despite high flow oxygen patient saturation at 74% with good waveform on monitor. Saturation returned to high 80s after several minutes of resting on BSC. Saturation decreased again to 70s during transfer back to bed. Once saturations at 92%, NRB removed and patient left resting with 15L HFNC with sats 88-92%. MD aware. Will continue to monitor.

## 2016-01-17 NOTE — Progress Notes (Signed)
PULMONARY / CRITICAL CARE MEDICINE   Name: Sean Jackson MRN: [id number] DOB: Jul 22, 1945    ADMISSION DATE:  01/31/2016 CONSULTATION DATE:  01/13/2016  REFERRING MD:  Dr. Wynelle Cleveland, [facility]  CHIEF COMPLAINT:  Short of breath  BRIEF SUMMARY: 70 y/o M with PMH of ETOH abuse, recent ETOH hepatitis with d/c on steroids (8/4), cirrhosis, unprovoked PE / DVT (previously on Xarelto, negative hypercoagulable workup), GGO on CT (? If related to PE), LLL pulmonary nodule and anemia who was admitted on 8/10 with 3-4 day hx of SOB.  Work up consistent with PE in the R pulmonary artery.  PCCM consulted for evaluation.  SUBJECTIVE:  Pt reports no distress.  Sats running 82-90% on 15L HFO2.  RN concerned about patient getting out of bed to use the restroom.   VITAL SIGNS: BP 101/62   Pulse 66   Temp 98.3 F (36.8 C)   Resp (!) 22   Ht 5\' 5"  (1.651 m)   Wt 139 lb 5.3 oz (63.2 kg)   SpO2 94%   BMI 23.19 kg/m   INTAKE / OUTPUT: I/O last 3 completed shifts: In: 1057.6 [P.O.:840; I.V.:217.6] Out: 2100 [Urine:2100]  PHYSICAL EXAMINATION: General: pleasant, speaks in full sentences w/o accessory muscle use Neuro:  Alert, normal strength, CN intact HEENT:  Pupils reactive, mild scleral icterus, no LAN Cardiovascular:  Regular, no murmur Lungs:  Even/non-labored, faint basilar crackles, no wheeze. Good ae. (-) accessory muscle use.   Abdomen:  Soft, non tender, + bowel sounds Musculoskeletal:  Mild Lt > Rt LE edema Skin:  Dry skin, no rashes or lesions  LABS:  BMET  Recent Labs Lab 01/16/16 0403 01/16/16 1705 01/17/16 0343  NA 134* 135 137  K 5.6* 4.1 4.2  CL 104 104 104  CO2 23 25 25   BUN 24* 18 19  CREATININE 0.60* 0.70 0.57*  GLUCOSE 259* 211* 246*    Electrolytes  Recent Labs Lab 01/16/16 0403 01/16/16 1705 01/17/16 0343  CALCIUM 8.6* 8.5* 8.5*    CBC  Recent Labs Lab 01/15/16 0331 01/16/16 0403 01/17/16 0343  WBC 18.3* 18.8* 18.2*  HGB 11.9* 12.3* 12.0*  HCT  35.4* 37.0* 36.1*  PLT 365 342 349    Coag's  Recent Labs Lab 01/27/2016 1703  APTT 57*  INR 1.24    Sepsis Markers No results for input(s): LATICACIDVEN, PROCALCITON, O2SATVEN in the last 168 hours.  ABG  Recent Labs Lab 01/25/2016 1305 01/16/16 0836  PHART 7.522* 7.442  PCO2ART 29.6* 37.0  PO2ART 88.4 57.3*    Liver Enzymes  Recent Labs Lab 02/02/2016 1350 01/14/16 0228  AST 218* 139*  ALT 325* 259*  ALKPHOS 296* 242*  BILITOT 4.3* 3.7*  ALBUMIN 3.2* 3.0*    Cardiac Enzymes  Recent Labs Lab 01/19/2016 1703  TROPONINI 0.05*    Glucose No results for input(s): GLUCAP in the last 168 hours.  Imaging No results found.   STUDIES:  PFT 12/13/15 >> FEV1 1.90 (73%), FEV1% 85, TLC 4.28 (71%), DLCO 39% CTA Chest 01/10/2016 >> PE Rt main PA, chronic PE Rt upper/middle/lower lobes, chronic PE Lt PA, RV:LV ratio 1, basilar BTX and interstitial changes b/l, Lt lung areas of GGO ECHO 01/13/16 >> mild LVH, EF 55 to [id number], grade 1 diastolic CHF, mild increase PA pressures LE Venous Duplex 01/13/16 >> Lt common femoral DVT, Rt popliteal and posterior tibial DVT  SIGNIFICANT EVENTS: 8/10  Admit with PE, started on heparin gtt 8/14  Transitioned to Eliquis, remains on HFO2 /  NRB  LINES/TUBES:  DISCUSSION: 70 y/o M with PMH of ETOH abuse, recent ETOH hepatitis with d/c on steroids (8/4), cirrhosis, unprovoked PE / DVT (previously on Xarelto, negative hypercoagulable workup), GGO on CT (? If related to PE), LLL pulmonary nodule and anemia who was admitted on 8/10 with 3-4 day hx of SOB.  Work up consistent with PE in the R pulmonary artery.  Followed by Dr. Lamonte Sakai. PCCM consulted for evaluation. DNR/DNI  ASSESSMENT / PLAN:  PULMONARY A: Acute hypoxic respiratory failure - in setting of PE with underlying GGO, bronchiectasis.  Negative balance, await PCT GGO Lt lung on CT chest >> likely from relative hyperemia in setting of acute PE in Rt PA. Basilar predominant ILD with  Bronchiectasis P:   Continue supplemental oxygen to keep SpO2 > 88%. High flow O2 per RT  F/u CXR intermittently > repeat in am  OOB as tolerated  Pulmonary hygiene - flutter  Assess PCT  Follow up with pulmonary post discharge > arranged, in d/c summary DNI BiPAP PRN for increased WOB   CARDIOVASCULAR A:  Acute on chronic pulmonary embolism with b/l leg DVT. P:  Continue Eliquis  Will need lifelong anticoagulation  Monitor hemodynamics DNR  RENAL A:   No acute issues. P:   Monitor renal fx, urine outpt, electrolytes  GASTROINTESTINAL A:   Alcoholic hepatitis. P:   Prednisone per primary team  HEMATOLOGIC A:   Leukocytosis. Macrocytic Anemia - likely secondary to ETOH abuse P:  F/u CBC Defer to primary  INFECTIOUS A:   No evidence for infection. P:   Monitor clinically PCT as above   ENDOCRINE A:   No acute issues.   P:   Monitor blood sugar on BMET  NEUROLOGIC A:   No acute issues. Hx ETOH  P:   Monitor mental status Monitor for withdrawal symptoms    FAMILY:  No family available.  Patient updated on plan of care 8/15.    Noe Gens, NP-C [facility] Pgr: [phone] or if no answer [phone] 01/17/2016, 9:16 AM      ATTENDING NOTE / ATTESTATION NOTE :   I have discussed the case with the resident/APP  Noe Gens  I agree with the resident/APP's  history, physical examination, assessment, and plans.    I have edited the above note and modified it according to our agreed history, physical examination, assessment and plan.   Briefly, the patient admitted for acute dyspnea. He was diagnosed with acute right main pulmonary artery embolism on top of chronic bilateral pulmonary embolism. I am not sure why he was not on Coumadin or anticoagulation as an outpt. According to him, this was stopped months after his last pulmonary embolism. He was started on heparin drip. Transitioned to Eliquis today.  Chest CT scan showed  groundglass like infiltrate on the left lung as well. Not sure if this is hyperemia or hypervolemia. He has been diuresced.  Repeat chest x-ray is a little better compared to couple days ago. Pro calcitonin is reassuring. We switched his Ventimask to high flow nasal cannula and currently is on 15 L. We switched probes and his sats are 92%. Alternatively, if he cannot tolerate the nasal cannula, we can switch him to high flow nasal cannula with 60 L/m, titrate FiO2 accordingly. Holding off on antibiotics for now.  Mentioned to the patient that he needs to be on Eliquis  indefinitely and I told him that Eliquis  And  alcohol drinking don't mix. Increased  risk for trauma with alcohol drinking and blood thinners. He seemed to understand.   Family : No family at bedside.    Monica Becton, MD 01/17/2016, 10:40 AM [facility] Pager [phone] After 3 pm or if no answer, call [phone]

## 2016-01-18 ENCOUNTER — Inpatient Hospital Stay (HOSPITAL_COMMUNITY): Payer: Medicare Other

## 2016-01-18 DIAGNOSIS — R740 Nonspecific elevation of levels of transaminase and lactic acid dehydrogenase [LDH]: Secondary | ICD-10-CM

## 2016-01-18 DIAGNOSIS — J189 Pneumonia, unspecified organism: Secondary | ICD-10-CM

## 2016-01-18 LAB — CBC
HCT: 33.7 % — ABNORMAL LOW (ref 39.0–52.0)
Hemoglobin: 11.2 g/dL — ABNORMAL LOW (ref 13.0–17.0)
MCH: 39.6 pg — AB (ref 26.0–34.0)
MCHC: 33.2 g/dL (ref 30.0–36.0)
MCV: 119.1 fL — ABNORMAL HIGH (ref 78.0–100.0)
PLATELETS: 332 10*3/uL (ref 150–400)
RBC: 2.83 MIL/uL — ABNORMAL LOW (ref 4.22–5.81)
RDW: 16.6 % — AB (ref 11.5–15.5)
WBC: 15.2 10*3/uL — ABNORMAL HIGH (ref 4.0–10.5)

## 2016-01-18 LAB — HEPATIC FUNCTION PANEL
ALT: 163 U/L — ABNORMAL HIGH (ref 17–63)
AST: 76 U/L — ABNORMAL HIGH (ref 15–41)
Albumin: 2.6 g/dL — ABNORMAL LOW (ref 3.5–5.0)
Alkaline Phosphatase: 179 U/L — ABNORMAL HIGH (ref 38–126)
BILIRUBIN DIRECT: 0.7 mg/dL — AB (ref 0.1–0.5)
BILIRUBIN INDIRECT: 0.7 mg/dL (ref 0.3–0.9)
BILIRUBIN TOTAL: 1.4 mg/dL — AB (ref 0.3–1.2)
Total Protein: 5.5 g/dL — ABNORMAL LOW (ref 6.5–8.1)

## 2016-01-18 LAB — LACTIC ACID, PLASMA
LACTIC ACID, VENOUS: 2.5 mmol/L — AB (ref 0.5–1.9)
Lactic Acid, Venous: 2.5 mmol/L (ref 0.5–1.9)

## 2016-01-18 LAB — SEDIMENTATION RATE: SED RATE: 36 mm/h — AB (ref 0–16)

## 2016-01-18 LAB — TSH: TSH: 3.386 u[IU]/mL (ref 0.350–4.500)

## 2016-01-18 LAB — MRSA PCR SCREENING: MRSA BY PCR: NEGATIVE

## 2016-01-18 MED ORDER — METHYLPREDNISOLONE SODIUM SUCC 125 MG IJ SOLR
80.0000 mg | Freq: Four times a day (QID) | INTRAMUSCULAR | Status: DC
  Administered 2016-01-18 – 2016-01-25 (×29): 80 mg via INTRAVENOUS
  Filled 2016-01-18 (×29): qty 2

## 2016-01-18 MED ORDER — PIPERACILLIN-TAZOBACTAM 3.375 G IVPB 30 MIN
3.3750 g | Freq: Once | INTRAVENOUS | Status: AC
  Administered 2016-01-18: 3.375 g via INTRAVENOUS
  Filled 2016-01-18: qty 50

## 2016-01-18 MED ORDER — PIPERACILLIN-TAZOBACTAM 3.375 G IVPB
3.3750 g | Freq: Three times a day (TID) | INTRAVENOUS | Status: DC
  Administered 2016-01-18 – 2016-01-23 (×14): 3.375 g via INTRAVENOUS
  Filled 2016-01-18 (×14): qty 50

## 2016-01-18 MED ORDER — SULFAMETHOXAZOLE-TRIMETHOPRIM 800-160 MG PO TABS
2.0000 | ORAL_TABLET | Freq: Three times a day (TID) | ORAL | Status: DC
  Administered 2016-01-18 – 2016-01-23 (×15): 2 via ORAL
  Filled 2016-01-18 (×15): qty 2

## 2016-01-18 MED ORDER — METHYLPREDNISOLONE SODIUM SUCC 125 MG IJ SOLR: 60.0000 mg | Freq: Four times a day (QID) | INTRAMUSCULAR | Status: DC

## 2016-01-18 NOTE — Progress Notes (Signed)
Pharmacy Antibiotic Note  Sean Jackson is a 70 y.o. male admitted on 01/11/2016 with respiratory failure d/t recurrent PE, however today is day 6 of anticoagulation and still with very high O2 requirement; CCM would like to r/o alternate infectious (PCP, aspiration, PsA pneumonia) and inflammatory etiologies. Pharmacy has been consulted for PO Bactrim and Zosyn dosing. Known EtOH abuse but only evidence of fatty liver dz on CT abc from 01/01/16.  LFTs acutely elevated during that admission and still above normal but trending down (3-5x ULN on 8/12)  Today, 01/18/2016:  Transaminases, tbili still elevated but much improved, now < 3x ULN  SCr stable wnl/low  Plan:  Zosyn 3.375 g IV given once over 30 minutes, then every 8 hrs by 4-hr infusion  Bactrim DS 2 tabs (320 mg TMP) q8 hr; until PCP r/o, recommended course is 21 days.  Continue to follow LFTs while on Bactrim Follow clinical course, renal function, culture results as available Follow for de-escalation of antibiotics and LOT  Height: 5\' 5"  (165.1 cm) Weight: 139 lb 5.3 oz (63.2 kg) IBW/kg (Calculated) : 61.5  Temp (24hrs), Avg:98.5 F (36.9 C), Min:98.1 F (36.7 C), Max:98.9 F (37.2 C)   Recent Labs Lab 01/14/16 0228 01/15/16 0331 01/16/16 0403 01/16/16 1705 01/17/16 0343 01/18/16 0319  WBC 20.5* 18.3* 18.8*  --  18.2* 15.2*  CREATININE 0.69 0.74 0.60* 0.70 0.57*  --     Estimated Creatinine Clearance: 73.7 mL/min (by C-G formula based on SCr of 0.8 mg/dL).    No Known Allergies  Antimicrobials this admission: Received Vanc/Zosyn x 1, then Levaquin/Flagyl 7/31 >> 8/2 during previous admission Zosyn 8/16 >>  Bactrim 8/16 >>   Dose adjustments this admission: ---  Microbiology results: 7/30 BCx: NGF 7/30 MRSA PCR: neg No Cx obtained yet this admit  Thank you for allowing pharmacy to be a part of this patient's care.  Reuel Boom, PharmD, BCPS Pager: [phone] 01/18/2016, 10:22 AM

## 2016-01-18 NOTE — Progress Notes (Signed)
Increased HFNC to 15 LPM by RN per patient SPO2 dropping to 70-80's with coughing, talking, excertion. No distress noted, no accessory muscle use. Sat's do come back up to high 80's to low 90's. Patient states he is ok and does not feel SOB. RT will continue to monitor patient.

## 2016-01-18 NOTE — Progress Notes (Signed)
PROGRESS NOTE    Sean Jackson  [id number] DOB: 01/16/1946 DOA: 01/22/2016  PCP: Aretta Nip, MD   Brief Narrative:  Sean Jackson is a 70 y.o. male with medical history significant of alcohol abuse with recent admission for alcoholic hepatitis discharged on 8/4 on steroid treatment, cirrhosis, PE in the past (Xarelto in 2014) and anemia who presents for dyspnea x 3-4 days. Found to be hypoxic in the ER. CT chest shows acute PE in right main pulmonary artery . His disposition is difficult as he continues to require high levels of O2.   Subjective: No dyspnea at rest. No chest pain. No cough, nausea or vomiting.  Oxygen drop on exertion, or eating.   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- h/o left leg DVT 5 yrs prior to this- he was on Xarelto which was discontinued on 9/15 by his pulmonologist as no genetic predisposition/ hypercoagulable state was found - he admits that he had been more sendentary over the past few weeks - venous duplex of legs and b/l IVC showing b/l DVT including in left iliac vein - previously not on 02 at home -  still quite hypoxic and requiring non-re breather- needed BiPAP at one point for hypoxia - attempting to wean to nasal cannula but continues to need venti mask or high flow O2 especially when getting out of bed - has underlying lung disease with fibrosis and bronchiectasis and persistent chronic PEs which may be contributing to the hypoxia  - pulmonary following- no indication for thrombolysis - switched to Eliquis which he is tolerating- anticoagulation needs to be life long -discussed with Dr Corrie Dandy, will try steroids and Antibiotics for hypoxemia and lung infiltrates. Patient negative 5 L.   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 Right:  DVT noted in the Popliteal vein and the posterior tibial vein.   8/12 ECHO Normal LV systolic function; grade 1 diastolic dysfunction; trace   MR; mild TR; mildly elevated pulmonary pressure.  Antimicrobials:  Anti-infectives    None       Objective: Vitals:   01/18/16 0500 01/18/16 0700 01/18/16 0800 01/18/16 0804  BP: (!) 141/74 (!) 128/58 123/67   Pulse: 79 78 88   Resp: (!) 26 (!) 21 (!) 34   Temp:    98.7 F (37.1 C)  TempSrc:    Axillary  SpO2: (!) 88% 90% (!) 87%   Weight:  Height:        Intake/Output Summary (Last 24 hours) at 01/18/16 0901 Last data filed at 01/18/16 0525  Gross per 24 hour  Intake              950 ml  Output             1275 ml  Net             -325 ml   Filed Weights   01/15/2016 1230 01/13/16 0428 01/14/16 0400  Weight: 63.5 kg (140 lb) 66.4 kg (146 lb 6.2 oz) 63.2 kg (139 lb 5.3 oz)    Examination: General exam: Appears comfortable  HEENT: PERRLA, oral mucosa moist, no sclera icterus or thrush Respiratory system: Clear to auscultation. Respiratory effort normal.- pulse ox 90% on 100%  non-rebreather Cardiovascular system: S1 & S2 heard, RRR.  No murmurs   Gastrointestinal system: Abdomen soft, non-tender, nondistended. Normal bowel sound. No organomegaly Central nervous system: Alert and oriented. No focal neurological deficits. Extremities: No cyanosis, clubbing - no significant LE edema Skin: No rashes or ulcers Psychiatry:  Mood & affect appropriate.     Data Reviewed: I have personally reviewed following labs and imaging studies  CBC:  Recent Labs Lab 01/15/2016 1350  01/25/2016 1817  01/14/16 0228 01/15/16 0331 01/16/16 0403 01/17/16 0343 01/18/16 0319  WBC 25.2*  --  23.7*  < > 20.5* 18.3* 18.8* 18.2* 15.2*  NEUTROABS 23.4*  --  20.8*  --   --   --   --   --   --   HGB 13.4  < > 12.5*  < > 12.4* 11.9* 12.3* 12.0* 11.2*  HCT 39.1  < > 36.5*  < > 35.5* 35.4* 37.0* 36.1* 33.7*  MCV 123.3*  --  122.9*  < > 121.2* 121.6* 121.7* 119.9* 119.1*  PLT 326  --  327  < > 347 365 342 349 332  < > = values in this interval not displayed. Basic Metabolic Panel:  Recent Labs Lab 01/14/16 0228 01/15/16 0331 01/16/16 0403 01/16/16 1705 01/17/16 0343  NA 134* 134* 134* 135 137  K 5.0 4.9 5.6* 4.1 4.2  CL 102 102 104 104 104  CO2 24 25 23 25 25   GLUCOSE 199* 154* 259* 211* 246*  BUN 19 24* 24* 18 19  CREATININE 0.69 0.74 0.60* 0.70 0.57*  CALCIUM 8.5* 8.5* 8.6* 8.5* 8.5*   GFR: Estimated Creatinine Clearance: 73.7 mL/min (by C-G formula based on SCr of 0.8 mg/dL). Liver Function Tests:  Recent Labs Lab 01/27/2016 1350 01/14/16 0228  AST 218* 139*  ALT 325* 259*  ALKPHOS 296* 242*  BILITOT 4.3* 3.7*  PROT 7.0 6.3*  ALBUMIN 3.2* 3.0*   No results for input(s): LIPASE, AMYLASE in the last 168 hours. No results for input(s): AMMONIA in the last 168 hours. Coagulation Profile:  Recent Labs Lab 01/14/2016 1703  INR 1.24   Cardiac Enzymes:  Recent Labs Lab 02/02/2016 1703  TROPONINI 0.05*   BNP (last 3 results) No results for input(s): PROBNP in  the last 8760 hours. HbA1C: No results for input(s): HGBA1C in the last 72 hours. CBG: No results for input(s): GLUCAP in the last 168 hours. Lipid Profile: No results for input(s): CHOL, HDL, LDLCALC, TRIG, CHOLHDL, LDLDIRECT in the last 72 hours. Thyroid Function Tests: No results for input(s): TSH, T4TOTAL, FREET4, T3FREE, THYROIDAB in the last 72 hours. Anemia Panel: No results for input(s): VITAMINB12, FOLATE, FERRITIN, TIBC, IRON, RETICCTPCT in the last  72 hours. Urine analysis:    Component Value Date/Time   COLORURINE AMBER (A) 05/18/2013 0830   APPEARANCEUR CLEAR 05/18/2013 0830   LABSPEC >1.046 (H) 05/18/2013 0830   PHURINE 5.5 05/18/2013 0830   GLUCOSEU NEGATIVE 05/18/2013 0830   HGBUR NEGATIVE 05/18/2013 0830   BILIRUBINUR NEGATIVE 05/18/2013 0830   KETONESUR NEGATIVE 05/18/2013 0830   PROTEINUR NEGATIVE 05/18/2013 0830   UROBILINOGEN 1.0 05/18/2013 0830   NITRITE NEGATIVE 05/18/2013 0830   LEUKOCYTESUR NEGATIVE 05/18/2013 0830   Sepsis Labs: @LABRCNTIP (procalcitonin:4,lacticidven:4) )No results found for this or any previous visit (from the past 240 hour(s)).       Radiology Studies: Dg Chest Port 1 View  Result Date: 01/18/2016 CLINICAL DATA:  Respiratory failure. EXAM: PORTABLE CHEST 1 VIEW COMPARISON:  01/17/2016.  01/17/2016.  CT 18 2017 FINDINGS: Mediastinum hilar structures normal. Diffuse left lung infiltrate again change. Low lung volumes. No pleural effusion. Linear density noted over both upper lungs, possibly skin folds and/or bullous changes. Attention to these on follow-up chest x-ray suggested. IMPRESSION: 1.  Diffuse left lung infiltrate.  No interim change. 2. Linear densities noted over the upper lungs, possibly skin fold and/or bullous change. No definite pneumothorax noted. Attention to these regions on subsequent follow-up chest x-ray suggested. Electronically Signed   By: Marcello Moores  Register   On: 01/18/2016 07:00   Dg Chest Port 1 View  Result  Date: 01/17/2016 CLINICAL DATA:  History of known PE and left lower lobe infiltrate. EXAM: PORTABLE CHEST 1 VIEW COMPARISON:  01/14/2016 FINDINGS: Cardiac shadow is stable. Persistent left basilar infiltrate is noted. The right lung remains clear. No sizable effusion is noted. IMPRESSION: Stable left basilar infiltrate. Electronically Signed   By: Inez Catalina M.D.   On: 01/17/2016 10:47      Scheduled Meds: . antiseptic oral rinse  7 mL Mouth Rinse BID  . apixaban  10 mg Oral BID   Followed by  . [START ON 01/23/2016] apixaban  5 mg Oral BID  . magnesium oxide  200 mg Oral Daily  . multivitamin with minerals  1 tablet Oral Daily  . pantoprazole  40 mg Oral BID  . predniSONE  20 mg Oral BID WC  . sodium chloride flush  3 mL Intravenous Q12H   Continuous Infusions:     LOS: 6 days    Time spent in minutes: 35    Kapena Hamme A, MD [facility] [id number] [email or web address] Password [email or web address] 01/18/2016, 9:01 AM

## 2016-01-18 NOTE — Progress Notes (Signed)
CRITICAL VALUE ALERT  Critical value received:  Lactic acid 2.5  Date of notification:  01/18/16  Time of notification:  11:52  Critical value read back:Yes.    Nurse who received alert:  Juel Burrow, RN   MD notified (1st page):  Dr. Corrie Dandy  Time of first page:  11:53  MD notified (2nd page):  Time of second page:  Responding MD:  Dr. Corrie Dandy  Time MD responded:  11:53

## 2016-01-18 NOTE — Progress Notes (Signed)
PULMONARY / CRITICAL CARE MEDICINE   Name: Sean Jackson MRN: [id number] DOB: 14-Mar-1946    ADMISSION DATE:  01/18/2016 CONSULTATION DATE:  01/13/2016  REFERRING MD:  Dr. Wynelle Cleveland, [facility]  CHIEF COMPLAINT:  Short of breath  BRIEF SUMMARY: 70 y/o M with PMH of ETOH abuse, recent ETOH hepatitis with d/c on steroids (8/4), cirrhosis, unprovoked PE / DVT (previously on Xarelto, negative hypercoagulable workup), GGO on CT (? If related to PE), LLL pulmonary nodule and anemia who was admitted on 8/10 with 3-4 day hx of SOB.  Work up consistent with PE in the R pulmonary artery.  PCCM consulted for evaluation.  SUBJECTIVE:   Pt remains hypoxemic on 15L [place] yesterday. He had oxygen desaturation early morning so he has been on nonrebreather since that time. Mild dyspnea. No other subjective complaints. No chest pain. No cough. Able to tolerate by mouth diet.   VITAL SIGNS: BP (!) 170/57   Pulse (!) 104   Temp 98.7 F (37.1 C) (Axillary)   Resp (!) 36   Ht 5' 5"  (1.651 m)   Wt 63.2 kg (139 lb 5.3 oz)   SpO2 (!) 86%   BMI 23.19 kg/m   INTAKE / OUTPUT: I/O last 3 completed shifts: In: 950 [P.O.:950] Out: 1775 [Urine:1775]  PHYSICAL EXAMINATION: General: pleasant, speaks in full sentences w/o significant accessory muscle use. Mild distress.  Neuro:  Alert, normal strength, CN intact (-) lateralizing signs.  HEENT:  Pupils reactive, mild scleral icterus, no LAN Cardiovascular:  Regular, no murmur Lungs:  Even/non-labored,Bibasilar crackles. no wheeze. Good ae. Mild accessory muscle use Abdomen:  Soft, non tender, + bowel sounds Musculoskeletal:  Mild Lt > Rt LE edema Skin:  Dry skin, no rashes or lesions  LABS:  BMET  Recent Labs Lab 01/16/16 0403 01/16/16 1705 01/17/16 0343  NA 134* 135 137  K 5.6* 4.1 4.2  CL 104 104 104  CO2 23 25 25   BUN 24* 18 19  CREATININE 0.60* 0.70 0.57*  GLUCOSE 259* 211* 246*    Electrolytes  Recent Labs Lab 01/16/16 0403 01/16/16 1705  01/17/16 0343  CALCIUM 8.6* 8.5* 8.5*    CBC  Recent Labs Lab 01/16/16 0403 01/17/16 0343 01/18/16 0319  WBC 18.8* 18.2* 15.2*  HGB 12.3* 12.0* 11.2*  HCT 37.0* 36.1* 33.7*  PLT 342 349 332    Coag's  Recent Labs Lab 01/09/2016 1703  APTT 57*  INR 1.24    Sepsis Markers  Recent Labs Lab 01/17/16 0343  PROCALCITON 0.16    ABG  Recent Labs Lab 01/15/2016 1305 01/16/16 0836  PHART 7.522* 7.442  PCO2ART 29.6* 37.0  PO2ART 88.4 57.3*    Liver Enzymes  Recent Labs Lab 01/05/2016 1350 01/14/16 0228  AST 218* 139*  ALT 325* 259*  ALKPHOS 296* 242*  BILITOT 4.3* 3.7*  ALBUMIN 3.2* 3.0*    Cardiac Enzymes  Recent Labs Lab 01/22/2016 1703  TROPONINI 0.05*    Glucose No results for input(s): GLUCAP in the last 168 hours.  Imaging Dg Chest Port 1 View  Result Date: 01/18/2016 CLINICAL DATA:  Respiratory failure. EXAM: PORTABLE CHEST 1 VIEW COMPARISON:  01/17/2016.  01/17/2016.  CT 18 2017 FINDINGS: Mediastinum hilar structures normal. Diffuse left lung infiltrate again change. Low lung volumes. No pleural effusion. Linear density noted over both upper lungs, possibly skin folds and/or bullous changes. Attention to these on follow-up chest x-ray suggested. IMPRESSION: 1.  Diffuse left lung infiltrate.  No interim change. 2. Linear densities noted over  the upper lungs, possibly skin fold and/or bullous change. No definite pneumothorax noted. Attention to these regions on subsequent follow-up chest x-ray suggested. Electronically Signed   By: Marcello Moores  Register   On: 01/18/2016 07:00   Dg Chest Port 1 View  Result Date: 01/17/2016 CLINICAL DATA:  History of known PE and left lower lobe infiltrate. EXAM: PORTABLE CHEST 1 VIEW COMPARISON:  01/14/2016 FINDINGS: Cardiac shadow is stable. Persistent left basilar infiltrate is noted. The right lung remains clear. No sizable effusion is noted. IMPRESSION: Stable left basilar infiltrate. Electronically Signed   By: Inez Catalina M.D.   On: 01/17/2016 10:47     STUDIES:  PFT 12/13/15 >> FEV1 1.90 (73%), FEV1% 85, TLC 4.28 (71%), DLCO 39% CTA Chest 01/13/2016 >> PE Rt main PA, chronic PE Rt upper/middle/lower lobes, chronic PE Lt PA, RV:LV ratio 1, basilar BTX and interstitial changes b/l, Lt lung areas of GGO ECHO 01/13/16 >> mild LVH, EF 55 to 16%, grade 1 diastolic CHF, mild increase PA pressures LE Venous Duplex 01/13/16 >> Lt common femoral DVT, Rt popliteal and posterior tibial DVT  SIGNIFICANT EVENTS: 8/10  Admit with PE, started on heparin gtt 8/14  Transitioned to Eliquis, remains on HFO2 / NRB  LINES/TUBES:  DISCUSSION: 70 y/o M with PMH of ETOH abuse, recent ETOH hepatitis with d/c on steroids (8/4), cirrhosis, unprovoked PE / DVT (previously on Xarelto, negative hypercoagulable workup), GGO on CT (? If related to PE), LLL pulmonary nodule and anemia who was admitted on 8/10 with 3-4 day hx of SOB.  Work up consistent with PE in the R pulmonary artery.  Followed by Dr. Lamonte Sakai. PCCM consulted for evaluation. DNR/DNI  ASSESSMENT / PLAN:  PULMONARY A: Acute hypoxic respiratory failure. 2/2 the following: 1. Acute on chronic pulmonary embolism. Chest CT scan with acute filling defect in the right pulmonary artery. Per report, he has bilateral chronic filling defects in both pulmonary arteries. 2. Chest CT scan on admission showed groundglass like infiltrates in the left mid to lower lung zones. Based on x-ray, those have seemed to have progressed. Consideration could be smoldering infection or opportunistic infection such as PCP pneumonia given him being on chronic steroids. There could also be an occult gram-negative pneumonia or aspiration pneumonia given his alcohol abuse. No history of MRSA or staph infection in the past. 3. Groundglass like infiltrates could also be inflammatory in nature. I'm not sure if he has underlying interstitial lung disease which is flaring up. 4. Fluid overload was a  consideration but he has been diuresis to -5 L and he still remains hypoxemic. 5. I doubt he is in pulmonary edema/CHF exacerbation.  P:   I will switch him to high flow nasal cannula today. Try 60 L/m if he tolerates. Try 100% FiO2 and cut down FiO2 to keep sats more than 88%. As I'm concerned for PCP pneumonia, I will ask pharmacy to help with dosing Bactrim. His LFTs are elevated. We'll repeat LFTs today. Start Zosyn as well. I'm holding off on vancomycin. I know at the end of the day pro calcitonin is within normal, but as the patient is DNR/DNI, and he remains hypoxemic, I want to give him his best shot as far as improvement. Will switch his by mouth prednisone to IV Medrol 80 mg IV every 6 hours. To cover whatever inflammation going on. We'll check serology to rule out connective tissue disease (lupus, RF, ANCA, scleroderma, ESR). Less likely but will be thorough. Check HIV as well.  Continue flutter valve.  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 P:  Continue Eliquis  Will need lifelong anticoagulation  Monitor hemodynamics   RENAL A:   No acute issues. P:   Monitor renal fx, urine outpt, electrolytes  GASTROINTESTINAL A:   Alcoholic hepatitis. Transaminitis. P:   Will switch prednisone to IV steroids. Check LFTs today.  HEMATOLOGIC A:   Leukocytosis. Macrocytic Anemia - likely secondary to ETOH abuse P:  F/u CBC   INFECTIOUS A:   As mentioned, I'm concerned for opportunistic infection given chronic steroid use. I'm concerned for possible PCP. Possible gram-negative pneumonia or aspiration pneumonia. P:   Start Bactrim, Zosyn. Hold off on vancomycin. Check LFTs. Check blood culture, recheck MRSA screen. Check lactic acid.  ENDOCRINE A:   No acute issues.   P:   Monitor blood sugar on BMET  NEUROLOGIC A:   No acute issues. Hx ETOH  P:   Monitor mental status Monitor for withdrawal symptoms   I extensively  discussed with the patient and his youngest brother the overall condition and prognosis. Patient and brother understand the severity of his illness. The understand that it is possible we cannot cut down the oxygen. Patient remains a DO NOT RESUSCITATE and DO NOT INTUBATE. Most likely he will go to [facility] or rehabilitation place once discharged from the hospital.  Critical care time spent with this patient: 35 minutes.  Monica Becton, MD 01/18/2016, 10:04 AM [facility] Pager [phone] After 3 pm or if no answer, call [phone]

## 2016-01-19 DIAGNOSIS — F1010 Alcohol abuse, uncomplicated: Secondary | ICD-10-CM

## 2016-01-19 DIAGNOSIS — I825Z3 Chronic embolism and thrombosis of unspecified deep veins of distal lower extremity, bilateral: Secondary | ICD-10-CM

## 2016-01-19 LAB — BASIC METABOLIC PANEL
Anion gap: 7 (ref 5–15)
BUN: 25 mg/dL — AB (ref 6–20)
CHLORIDE: 105 mmol/L (ref 101–111)
CO2: 24 mmol/L (ref 22–32)
Calcium: 8.2 mg/dL — ABNORMAL LOW (ref 8.9–10.3)
Creatinine, Ser: 0.71 mg/dL (ref 0.61–1.24)
GFR calc Af Amer: >60 mL/min (ref >60)
GFR calc non Af Amer: >60 mL/min (ref >60)
GLUCOSE: 353 mg/dL — AB (ref 65–99)
POTASSIUM: 4.6 mmol/L (ref 3.5–5.1)
SODIUM: 136 mmol/L (ref 135–145)

## 2016-01-19 LAB — ANTINUCLEAR ANTIBODIES, IFA: ANTINUCLEAR ANTIBODIES, IFA: NEGATIVE

## 2016-01-19 LAB — CBC
HEMATOCRIT: 32.2 % — AB (ref 39.0–52.0)
HEMOGLOBIN: 10.8 g/dL — AB (ref 13.0–17.0)
MCH: 39.4 pg — AB (ref 26.0–34.0)
MCHC: 33.5 g/dL (ref 30.0–36.0)
MCV: 117.5 fL — AB (ref 78.0–100.0)
Platelets: 310 10*3/uL (ref 150–400)
RBC: 2.74 MIL/uL — AB (ref 4.22–5.81)
RDW: 16.6 % — ABNORMAL HIGH (ref 11.5–15.5)
WBC: 16.1 10*3/uL — ABNORMAL HIGH (ref 4.0–10.5)

## 2016-01-19 LAB — ANCA TITERS: Atypical P-ANCA titer: <1:20 {titer}

## 2016-01-19 LAB — HIV ANTIBODY (ROUTINE TESTING W REFLEX): HIV Screen 4th Generation wRfx: NONREACTIVE

## 2016-01-19 LAB — GLUCOSE, CAPILLARY
GLUCOSE-CAPILLARY: 474 mg/dL — AB (ref 65–99)
Glucose-Capillary: 218 mg/dL — ABNORMAL HIGH (ref 65–99)
Glucose-Capillary: 356 mg/dL — ABNORMAL HIGH (ref 65–99)

## 2016-01-19 LAB — RHEUMATOID FACTOR: Rhuematoid fact SerPl-aCnc: <10 IU/mL (ref 0.0–13.9)

## 2016-01-19 LAB — MPO/PR-3 (ANCA) ANTIBODIES
ANCA Proteinase 3: <3.5 U/mL (ref 0.0–3.5)
Myeloperoxidase Abs: <9 U/mL (ref 0.0–9.0)

## 2016-01-19 LAB — ANTI-SCLERODERMA ANTIBODY: Scleroderma (Scl-70) (ENA) Antibody, IgG: <0.2 AI (ref 0.0–0.9)

## 2016-01-19 MED ORDER — INSULIN ASPART 100 UNIT/ML ~~LOC~~ SOLN
0.0000 [IU] | Freq: Every day | SUBCUTANEOUS | Status: DC
  Administered 2016-01-19: 5 [IU] via SUBCUTANEOUS
  Administered 2016-01-20: 3 [IU] via SUBCUTANEOUS
  Administered 2016-01-21: 2 [IU] via SUBCUTANEOUS

## 2016-01-19 MED ORDER — INSULIN ASPART 100 UNIT/ML ~~LOC~~ SOLN: 0.0000 [IU] | Freq: Three times a day (TID) | SUBCUTANEOUS | Status: DC

## 2016-01-19 MED ORDER — INSULIN ASPART 100 UNIT/ML ~~LOC~~ SOLN
0.0000 [IU] | Freq: Three times a day (TID) | SUBCUTANEOUS | Status: DC
  Administered 2016-01-19: 5 [IU] via SUBCUTANEOUS
  Administered 2016-01-19: 12 [IU] via SUBCUTANEOUS
  Administered 2016-01-20: 8 [IU] via SUBCUTANEOUS
  Administered 2016-01-20: 11 [IU] via SUBCUTANEOUS
  Administered 2016-01-20 – 2016-01-21 (×3): 5 [IU] via SUBCUTANEOUS
  Administered 2016-01-21: 8 [IU] via SUBCUTANEOUS
  Administered 2016-01-22: 2 [IU] via SUBCUTANEOUS
  Administered 2016-01-22: 3 [IU] via SUBCUTANEOUS
  Administered 2016-01-22 – 2016-01-23 (×2): 5 [IU] via SUBCUTANEOUS
  Administered 2016-01-23: 2 [IU] via SUBCUTANEOUS

## 2016-01-19 NOTE — Progress Notes (Signed)
PULMONARY / CRITICAL CARE MEDICINE   Name: Sean Jackson MRN: [id number] DOB: January 11, 1946    ADMISSION DATE:  01/22/2016 CONSULTATION DATE:  01/13/2016  REFERRING MD:  Dr. Wynelle Cleveland, [facility]  CHIEF COMPLAINT:  Short of breath  BRIEF SUMMARY: 70 y/o M with PMH of ETOH abuse, recent ETOH hepatitis with d/c on steroids (8/4), cirrhosis, unprovoked PE / DVT (previously on Xarelto, negative hypercoagulable workup), GGO on CT (? If related to PE), LLL pulmonary nodule and anemia who was admitted on 8/10 with 3-4 day hx of SOB.  Work up consistent with PE in the R pulmonary artery.  PCCM consulted for evaluation.  SUBJECTIVE:   No issues overnight. He feels better today. Finally on high flow nasal cannula. 100% FiO2, 20 L/m. No issues with eating. Less cough. No chest pain.  VITAL SIGNS: BP 112/62   Pulse 90   Temp 98.5 F (36.9 C) (Oral)   Resp (!) 27   Ht 5\' 5"  (1.651 m)   Wt 63 kg (138 lb 14.2 oz)   SpO2 94%   BMI 23.11 kg/m   INTAKE / OUTPUT: I/O last 3 completed shifts: In: 1180 [P.O.:840; I.V.:110; Other:80; IV Piggyback:150] Out: 2100 [Urine:2100]  PHYSICAL EXAMINATION: General: pleasant, speaks in full sentences w/o significant accessory muscle use. Less resp distress. Neuro:  Alert, normal strength, CN intact (-) lateralizing signs.  HEENT:  Pupils reactive, mild scleral icterus, no LAN Cardiovascular:  Regular, no murmur Lungs:  Even/non-labored,Bibasilar crackles. no wheeze. Good ae. Mild accessory muscle use Abdomen:  Soft, non tender, + bowel sounds Musculoskeletal:  Mild Lt > Rt LE edema Skin:  Dry skin, no rashes or lesions  LABS:  BMET  Recent Labs Lab 01/16/16 0403 01/16/16 1705 01/17/16 0343  NA 134* 135 137  K 5.6* 4.1 4.2  CL 104 104 104  CO2 23 25 25   BUN 24* 18 19  CREATININE 0.60* 0.70 0.57*  GLUCOSE 259* 211* 246*    Electrolytes  Recent Labs Lab 01/16/16 0403 01/16/16 1705 01/17/16 0343  CALCIUM 8.6* 8.5* 8.5*    CBC  Recent  Labs Lab 01/17/16 0343 01/18/16 0319 01/19/16 0351  WBC 18.2* 15.2* 16.1*  HGB 12.0* 11.2* 10.8*  HCT 36.1* 33.7* 32.2*  PLT 349 332 310    Coag's  Recent Labs Lab 01/23/2016 1703  APTT 57*  INR 1.24    Sepsis Markers  Recent Labs Lab 01/17/16 0343 01/18/16 1044 01/18/16 1407  LATICACIDVEN  --  2.5* 2.5*  PROCALCITON 0.16  --   --     ABG  Recent Labs Lab 01/18/2016 1305 01/16/16 0836  PHART 7.522* 7.442  PCO2ART 29.6* 37.0  PO2ART 88.4 57.3*    Liver Enzymes  Recent Labs Lab 01/31/2016 1350 01/14/16 0228 01/18/16 0319  AST 218* 139* 76*  ALT 325* 259* 163*  ALKPHOS 296* 242* 179*  BILITOT 4.3* 3.7* 1.4*  ALBUMIN 3.2* 3.0* 2.6*    Cardiac Enzymes  Recent Labs Lab 01/15/2016 1703  TROPONINI 0.05*    Glucose No results for input(s): GLUCAP in the last 168 hours.  Imaging No results found.   STUDIES:  PFT 12/13/15 >> FEV1 1.90 (73%), FEV1% 85, TLC 4.28 (71%), DLCO 39% CTA Chest 01/30/2016 >> PE Rt main PA, chronic PE Rt upper/middle/lower lobes, chronic PE Lt PA, RV:LV ratio 1, basilar BTX and interstitial changes b/l, Lt lung areas of GGO ECHO 01/13/16 >> mild LVH, EF 55 to [id number], grade 1 diastolic CHF, mild increase PA pressures LE Venous Duplex  01/13/16 >> Lt common femoral DVT, Rt popliteal and posterior tibial DVT  SIGNIFICANT EVENTS: 8/10  Admit with PE, started on heparin gtt 8/14  Transitioned to Eliquis, remains on HFO2 / NRB  LINES/TUBES:  DISCUSSION: 70 y/o M with PMH of ETOH abuse, recent ETOH hepatitis with d/c on steroids (8/4), cirrhosis, unprovoked PE / DVT (previously on Xarelto, negative hypercoagulable workup), GGO on CT (? If related to PE), LLL pulmonary nodule and anemia who was admitted on 8/10 with 3-4 day hx of SOB.  Work up consistent with PE in the R pulmonary artery.  Followed by Dr. Lamonte Sakai. PCCM consulted for evaluation. DNR/DNI  ASSESSMENT / PLAN:  PULMONARY A: Acute hypoxic respiratory failure. 2/2 the following: 1.  Acute on chronic pulmonary embolism. Chest CT scan with acute filling defect in the right pulmonary artery. Per report, he has bilateral chronic filling defects in both pulmonary arteries. 2. Chest CT scan on admission showed groundglass like infiltrates in the left mid to lower lung zones. Based on x-ray, those have seemed to have progressed. Consideration could be smoldering infection or opportunistic infection such as PCP pneumonia given him being on chronic steroids. There could also be an occult gram-negative pneumonia or aspiration pneumonia given his alcohol abuse. No history of MRSA or staph infection in the past. 3. Groundglass like infiltrates could also be inflammatory in nature. I'm not sure if he has underlying interstitial lung disease which is flaring up. 4. Fluid overload was a consideration but he has been diuresced and he still remains hypoxemic. 5. I doubt he is in pulmonary edema/CHF exacerbation.  P:   Continue high flow nasal cannula. He is currently on 20 L/m, 100% FiO2. I told respiratory therapy to try to increase the liters per minute to 40 to facilitate cutting down the FiO2. Keep O2 sats more than 88%. Continue Zosyn, Bactrim for now. Check LFTs in the morning. Continue steroids.  F/U CTD serology.   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 P:  Continue Eliquis  Will need lifelong anticoagulation  Monitor hemodynamics   RENAL A:   No acute issues. P:   Monitor renal fx, urine outpt, electrolytes  GASTROINTESTINAL A:   Alcoholic hepatitis. Transaminitis. P:   Cont IV steroids Check LFts in 1-2 days  HEMATOLOGIC A:   Leukocytosis. Macrocytic Anemia - likely secondary to ETOH abuse P:  F/u CBC   INFECTIOUS A:   Possible PCP pneumonia, possible gram-negative pneumonia. P:   cont Bactrim, Zosyn. Hold off on vancomycin. MRSA screen was negative. Check blood culture > follow up  ENDOCRINE A:   No acute issues.    P:   Monitor blood sugar on BMET  NEUROLOGIC A:   No acute issues. Hx ETOH  P:   Monitor mental status Monitor for withdrawal symptoms   I extensively discussed with the patient and his youngest brother the overall condition and prognosis on 8/16. Patient and brother understand the severity of his illness. The understand that it is possible we cannot cut down the oxygen. Patient remains a DO NOT RESUSCITATE and DO NOT INTUBATE. Most likely he will go to [facility] or rehabilitation place once discharged from the hospital.   [place], MD 01/19/2016, 10:52 AM [facility] Pager [phone] After 3 pm or if no answer, call [phone]

## 2016-01-19 NOTE — Progress Notes (Signed)
PROGRESS NOTE    KATON MARALDO  [id number] DOB: [birth year] DOA: 01/09/2016  PCP: Aretta Nip, MD   Brief Narrative:  Sean Jackson is a 70 y.o. male with medical history significant of alcohol abuse with recent admission for alcoholic hepatitis discharged on 8/4 on steroid treatment, cirrhosis, PE in the past (Xarelto in 2014) and anemia who presents for dyspnea x 3-4 days. Found to be hypoxic in the ER. CT chest shows acute PE in right main pulmonary artery . His disposition is difficult as he continues to require high levels of O2.   Subjective: No dyspnea at rest. No chest pain. No cough, nausea or vomiting.  He is feeling well, had a good night  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- h/o left leg DVT 5 yrs prior to this- he was on Xarelto which was discontinued on 9/15 by his pulmonologist as no genetic predisposition/ hypercoagulable state was found - he admits that he had been more sendentary over the past few weeks - venous duplex of legs and b/l IVC showing b/l DVT including in left iliac vein - previously not on 02 at home -  still quite hypoxic and requiring non-re breather- needed BiPAP at one point for hypoxia - attempting to wean to nasal cannula but continues to need venti mask or high flow O2 especially when getting out of bed - has underlying lung disease with fibrosis and bronchiectasis and persistent chronic PEs which may be contributing to the hypoxia  - pulmonary following- no indication for thrombolysis - switched to Eliquis which he is tolerating- anticoagulation needs to be life long -on IV steroids and antibiotics to cover for opportunistic infection, PNA  B/l lower extremity DVT - due to sedentary lifestyle and possible underlying coagulopathic disorder- has been worked up in the past for hypercoagulable state but  testing was negative - vascular surgery consulted for need for IVF filter as another PE will be life threatining for him- they do not feel he will benefit from a filter  Interstitial lung disease - has been evaluated by pulmonary recently and is being followed-  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 Right:  DVT noted in the Popliteal vein and the posterior tibial vein.   8/12 ECHO Normal LV systolic function; grade 1 diastolic dysfunction; trace   MR; mild TR; mildly elevated pulmonary pressure.  Antimicrobials:  Anti-infectives    Start     Dose/Rate Route Frequency Ordered Stop   01/18/16 1800  piperacillin-tazobactam (ZOSYN) IVPB 3.375 g     3.375 g 12.5 mL/hr over 240 Minutes Intravenous Every 8 hours 01/18/16 1010     01/18/16 1200  sulfamethoxazole-trimethoprim (BACTRIM DS,SEPTRA DS) 800-160 MG per tablet 2 tablet     2 tablet Oral Every 8 hours 01/18/16 1117     01/18/16 1100  piperacillin-tazobactam (ZOSYN) IVPB 3.375 g     3.375 g  100 mL/hr over 30 Minutes Intravenous  Once 01/18/16 1010 01/18/16 1053       Objective: Vitals:   01/19/16 0418 01/19/16 0500 01/19/16 0700 01/19/16 0800  BP:  (!) 100/47 112/62   Pulse:  65 90   Resp:  (!) 30 (!) 27   Temp: 97.3 F (36.3 C)   98.5 F (36.9 C)  TempSrc: Axillary   Oral  SpO2:  93% 90%   Weight: 63 kg (138 lb 14.2 oz)     Height:        Intake/Output Summary (Last 24 hours) at 01/19/16 0818 Last data filed at 01/19/16 0600  Gross per 24 hour  Intake              820 ml   Output             1525 ml  Net             -705 ml   Filed Weights   01/13/16 0428 01/14/16 0400 01/19/16 0418  Weight: 66.4 kg (146 lb 6.2 oz) 63.2 kg (139 lb 5.3 oz) 63 kg (138 lb 14.2 oz)    Examination: General exam: Appears comfortable  HEENT: PERRLA, oral mucosa moist, no sclera icterus or thrush Respiratory system: Clear to auscultation. Respiratory effort normal.- pulse ox 90% on High flow oxygen.  Cardiovascular system: S1 & S2 heard, RRR.  No murmurs   Gastrointestinal system: Abdomen soft, non-tender, nondistended. Normal bowel sound. No organomegaly Central nervous system: Alert and oriented. No focal neurological deficits. Extremities: No cyanosis, clubbing - no significant LE edema Skin: No rashes or ulcers Psychiatry:  Mood & affect appropriate.     Data Reviewed: I have personally reviewed following labs and imaging studies  CBC:  Recent Labs Lab 01/15/2016 1350  01/22/2016 1817  01/15/16 0331 01/16/16 0403 01/17/16 0343 01/18/16 0319 01/19/16 0351  WBC 25.2*  --  23.7*  < > 18.3* 18.8* 18.2* 15.2* 16.1*  NEUTROABS 23.4*  --  20.8*  --   --   --   --   --   --   HGB 13.4  < > 12.5*  < > 11.9* 12.3* 12.0* 11.2* 10.8*  HCT 39.1  < > 36.5*  < > 35.4* 37.0* 36.1* 33.7* 32.2*  MCV 123.3*  --  122.9*  < > 121.6* 121.7* 119.9* 119.1* 117.5*  PLT 326  --  327  < > 365 342 349 332 310  < > = values in this interval not displayed. Basic Metabolic Panel:  Recent Labs Lab 01/14/16 0228 01/15/16 0331 01/16/16 0403 01/16/16 1705 01/17/16 0343  NA 134* 134* 134* 135 137  K 5.0 4.9 5.6* 4.1 4.2  CL 102 102 104 104 104  CO2 24 25 23 25 25   GLUCOSE 199* 154* 259* 211* 246*  BUN 19 24* 24* 18 19  CREATININE 0.69 0.74 0.60* 0.70 0.57*  CALCIUM 8.5* 8.5* 8.6* 8.5* 8.5*   GFR: Estimated Creatinine Clearance: 73.7 mL/min (by C-G formula based on SCr of 0.8 mg/dL). Liver Function Tests:  Recent Labs Lab 01/10/2016 1350 01/14/16 0228 01/18/16 0319  AST 218*  139* 76*  ALT 325* 259* 163*  ALKPHOS 296* 242* 179*  BILITOT 4.3* 3.7* 1.4*  PROT 7.0 6.3* 5.5*  ALBUMIN 3.2* 3.0* 2.6*   No results for input(s): LIPASE, AMYLASE in the last 168 hours. No results for input(s): AMMONIA in the last 168 hours. Coagulation Profile:  Recent Labs Lab 01/19/2016 1703  INR 1.24  Cardiac Enzymes:  Recent Labs Lab 01/06/2016 1703  TROPONINI 0.05*   BNP (last 3 results) No results for input(s): PROBNP in the last 8760 hours. HbA1C: No results for input(s): HGBA1C in the last 72 hours. CBG: No results for input(s): GLUCAP in the last 168 hours. Lipid Profile: No results for input(s): CHOL, HDL, LDLCALC, TRIG, CHOLHDL, LDLDIRECT in the last 72 hours. Thyroid Function Tests:  Recent Labs  01/18/16 1011  TSH 3.386   Anemia Panel: No results for input(s): VITAMINB12, FOLATE, FERRITIN, TIBC, IRON, RETICCTPCT in the last 72 hours. Urine analysis:    Component Value Date/Time   COLORURINE AMBER (A) 05/18/2013 0830   APPEARANCEUR CLEAR 05/18/2013 0830   LABSPEC >1.046 (H) 05/18/2013 0830   PHURINE 5.5 05/18/2013 0830   GLUCOSEU NEGATIVE 05/18/2013 0830   HGBUR NEGATIVE 05/18/2013 0830   BILIRUBINUR NEGATIVE 05/18/2013 0830   KETONESUR NEGATIVE 05/18/2013 0830   PROTEINUR NEGATIVE 05/18/2013 0830   UROBILINOGEN 1.0 05/18/2013 0830   NITRITE NEGATIVE 05/18/2013 0830   LEUKOCYTESUR NEGATIVE 05/18/2013 0830   Sepsis Labs: @LABRCNTIP (procalcitonin:4,lacticidven:4) ) Recent Results (from the past 240 hour(s))  MRSA PCR Screening     Status: None   Collection Time: 01/18/16 10:29 AM  Result Value Ref Range Status   MRSA by PCR NEGATIVE NEGATIVE Final    Comment:        The GeneXpert MRSA Assay (FDA approved for NASAL specimens only), is one component of a comprehensive MRSA colonization surveillance program. It is not intended to diagnose MRSA infection nor to guide or monitor treatment for MRSA infections.          Radiology  Studies: Dg Chest Port 1 View  Result Date: 01/18/2016 CLINICAL DATA:  Respiratory failure. EXAM: PORTABLE CHEST 1 VIEW COMPARISON:  01/17/2016.  01/17/2016.  CT 18 2017 FINDINGS: Mediastinum hilar structures normal. Diffuse left lung infiltrate again change. Low lung volumes. No pleural effusion. Linear density noted over both upper lungs, possibly skin folds and/or bullous changes. Attention to these on follow-up chest x-ray suggested. IMPRESSION: 1.  Diffuse left lung infiltrate.  No interim change. 2. Linear densities noted over the upper lungs, possibly skin fold and/or bullous change. No definite pneumothorax noted. Attention to these regions on subsequent follow-up chest x-ray suggested. Electronically Signed   By: Marcello Moores  Register   On: 01/18/2016 07:00   Dg Chest Port 1 View  Result Date: 01/17/2016 CLINICAL DATA:  History of known PE and left lower lobe infiltrate. EXAM: PORTABLE CHEST 1 VIEW COMPARISON:  01/14/2016 FINDINGS: Cardiac shadow is stable. Persistent left basilar infiltrate is noted. The right lung remains clear. No sizable effusion is noted. IMPRESSION: Stable left basilar infiltrate. Electronically Signed   By: Inez Catalina M.D.   On: 01/17/2016 10:47      Scheduled Meds: . antiseptic oral rinse  7 mL Mouth Rinse BID  . apixaban  10 mg Oral BID   Followed by  . [START ON 01/23/2016] apixaban  5 mg Oral BID  . magnesium oxide  200 mg Oral Daily  . methylPREDNISolone (SOLU-MEDROL) injection  80 mg Intravenous Q6H  . multivitamin with minerals  1 tablet Oral Daily  . pantoprazole  40 mg Oral BID  . piperacillin-tazobactam (ZOSYN)  IV  3.375 g Intravenous Q8H  . sodium chloride flush  3 mL Intravenous Q12H  . sulfamethoxazole-trimethoprim  2 tablet Oral Q8H   Continuous Infusions:     LOS: 7 days    Time spent in minutes: 35 minutes.  Elmarie Shiley, MD [facility] Pager:[phone] [email or web address] Password [facility] 01/19/2016, 8:18 AM

## 2016-01-20 LAB — GLUCOSE, CAPILLARY
GLUCOSE-CAPILLARY: 251 mg/dL — AB (ref 65–99)
GLUCOSE-CAPILLARY: 228 mg/dL — AB (ref 65–99)
GLUCOSE-CAPILLARY: 344 mg/dL — AB (ref 65–99)
GLUCOSE-CAPILLARY: 270 mg/dL — AB (ref 65–99)

## 2016-01-20 LAB — CBC
HEMATOCRIT: 37.1 % — AB (ref 39.0–52.0)
HEMOGLOBIN: 12.3 g/dL — AB (ref 13.0–17.0)
MCH: 38.9 pg — ABNORMAL HIGH (ref 26.0–34.0)
MCHC: 33.2 g/dL (ref 30.0–36.0)
MCV: 117.4 fL — ABNORMAL HIGH (ref 78.0–100.0)
Platelets: 341 10*3/uL (ref 150–400)
RBC: 3.16 MIL/uL — AB (ref 4.22–5.81)
RDW: 16.6 % — ABNORMAL HIGH (ref 11.5–15.5)
WBC: 21 10*3/uL — AB (ref 4.0–10.5)

## 2016-01-20 LAB — BASIC METABOLIC PANEL
ANION GAP: 9 (ref 5–15)
BUN: 29 mg/dL — ABNORMAL HIGH (ref 6–20)
CHLORIDE: 104 mmol/L (ref 101–111)
CO2: 22 mmol/L (ref 22–32)
CREATININE: 1.09 mg/dL (ref 0.61–1.24)
Calcium: 8.7 mg/dL — ABNORMAL LOW (ref 8.9–10.3)
GFR calc non Af Amer: >60 mL/min (ref >60)
Glucose, Bld: 349 mg/dL — ABNORMAL HIGH (ref 65–99)
POTASSIUM: 4.4 mmol/L (ref 3.5–5.1)
SODIUM: 135 mmol/L (ref 135–145)

## 2016-01-20 NOTE — Progress Notes (Signed)
Late Entry- At 2107 pt getting up from [facility] and oxygen saturations began to drop into 50's on 70% 35L HFNC. Patient was sat back on BSC and O2 was increased to 100%. Saturations began to improve and pt was transferred to the bed. O2 was returned to 70% after patient stabalized. Encouraged pt to use the bedpan if he needs to use the bathroom again this evening, he agreed. Will continue to monitor.

## 2016-01-20 NOTE — Progress Notes (Signed)
Nutrition Brief Note  Patient for 8 day LOS in ICU/SDU.   Wt Readings from Last 15 Encounters:  01/19/16 138 lb 14.2 oz (63 kg)  01/01/16 140 lb 6.9 oz (63.7 kg)  10/07/15 153 lb (69.4 kg)  02/24/15 156 lb (70.8 kg)  08/02/14 142 lb (64.4 kg)  06/29/14 139 lb 12.8 oz (63.4 kg)  02/26/14 143 lb (64.9 kg)  06/10/13 152 lb 9.6 oz (69.2 kg)  06/10/13 150 lb (68 kg)  05/20/13 148 lb 13 oz (67.5 kg)  05/14/13 152 lb 6.4 oz (69.1 kg)  04/06/13 156 lb 6.4 oz (70.9 kg)  03/12/13 152 lb 5.4 oz (69.1 kg)    Body mass index is 23.11 kg/m. Patient meets criteria for normal weight based on current BMI.   Current diet order is Regular, patient is consuming 100% over the past few days. Pt reports that since admission his appetite has been "excellent" and he denies any chewing or swallowing difficulties while on HFNC. Pt states that he likes the food and the menu options available.   Pt reports that PTA he had to cook for himself and that he often would wait until he was very hungry to find something to eat. Per chart review, pt has lost 15 lbs (9% body weight) in the past 3 months which is significant for time frame.    Labs and medications reviewed.   Pt denies nutrition-related questions or concerns at this time. No nutrition interventions warranted at this time, but interventions will be warranted if pt does not continue to eat so well. If nutrition issues arise, please consult RD.     Jarome Matin, MS, RD, LDN Inpatient Clinical Dietitian Pager # [phone] After hours/weekend pager # [phone]

## 2016-01-20 NOTE — Progress Notes (Signed)
Pharmacy Antibiotic Note  Sean Jackson is a 70 y.o. male admitted on 01/08/2016 with respiratory failure d/t recurrent PE, however today is day 6 of anticoagulation and still with very high O2 requirement; CCM would like to r/o alternate infectious (PCP, aspiration, PsA pneumonia) and inflammatory etiologies. Pharmacy has been consulted for PO Bactrim and Zosyn dosing. Known EtOH abuse but only evidence of fatty liver dz on CT abc from 01/01/16.  LFTs acutely elevated during that admission and still above normal but trending down (3-5x ULN on 8/12)  Today, 01/20/2016: Day #2 antibiotics  Renal: Scr increased overnight but remains WNL per lab values  K = 4.4 (stable) - watch with TMP  WBC trending up  afebrile  Transaminases, tbili still elevated but much improved, now < 3x ULN (prior to start of TMP/SMZ)  Baseline PCT WNL on 8/15  Plan:  Continue Zosyn 3.375 g IV  every 8 hrs by 4-hr infusion  Continue Bactrim DS 2 tabs (320 mg TMP for 15mg /kg/day) q8 hr; until PCP r/o, recommended course is 21 days.  Continue to follow LFTs, renal function, and K+ while on Bactrim Follow clinical course, renal function, culture results as available Follow for de-escalation of antibiotics and LOT  Height: 5\' 5"  (165.1 cm) Weight: 138 lb 14.2 oz (63 kg) IBW/kg (Calculated) : 61.5  Temp (24hrs), Avg:98 F (36.7 C), Min:97 F (36.1 C), Max:98.4 F (36.9 C)   Recent Labs Lab 01/16/16 0403 01/16/16 1705 01/17/16 0343 01/18/16 0319 01/18/16 1044 01/18/16 1407 01/19/16 0351 01/20/16 0338  WBC 18.8*  --  18.2* 15.2*  --   --  16.1* 21.0*  CREATININE 0.60* 0.70 0.57*  --   --   --  0.71 1.09  LATICACIDVEN  --   --   --   --  2.5* 2.5*  --   --     Estimated Creatinine Clearance: 54.1 mL/min (by C-G formula based on SCr of 1.09 mg/dL).    No Known Allergies  Antimicrobials this admission: Received Vanc/Zosyn x 1, then Levaquin/Flagyl 7/31 >> 8/2 during previous admission Zosyn 8/16 >>   Bactrim 8/16 >>   Dose adjustments this admission: ---  Microbiology results: 7/30 BCx: NGF 7/30, 8/16 MRSA PCR: neg 8/16 BCx: NGTD  Thank you for allowing pharmacy to be a part of this patient's care.  Doreene Eland, PharmD, BCPS.   Pager: [phone] 01/20/2016 9:49 AM

## 2016-01-20 NOTE — Progress Notes (Signed)
PROGRESS NOTE    Sean Jackson  [id number] DOB: 06/01/1946 DOA: 01/09/2016  PCP: Aretta Nip, MD   Brief Narrative:  Sean Jackson is a 70 y.o. male with medical history significant of alcohol abuse with recent admission for alcoholic hepatitis discharged on 8/4 on steroid treatment, cirrhosis, PE in the past (Xarelto in 2014) and anemia who presents for dyspnea x 3-4 days. Found to be hypoxic in the ER. CT chest shows acute PE in right main pulmonary artery . His disposition is difficult as he continues to require high levels of O2.   Subjective: Feeling ok, de-sat just transition from bed to bedside commode.   Assessment & Plan:   Acute on chronic hypoxic respiratory failure. 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- h/o left leg DVT 5 yrs prior to this- he was on Xarelto which was discontinued on 9/15 by his pulmonologist as no genetic predisposition/ hypercoagulable state was found - he admits that he had been more sendentary over the past few weeks - venous duplex of legs and b/l IVC showing b/l DVT including in left iliac vein - previously not on 02 at home -  still quite hypoxic and requiring non-re breather- needed BiPAP at one point for hypoxia - has underlying lung disease with fibrosis and bronchiectasis and persistent chronic PEs which may be contributing to the hypoxia  - pulmonary following- no indication for thrombolysis - switched to Eliquis which he is tolerating- anticoagulation needs to be life long -on IV steroids and antibiotics to cover for opportunistic infection, PNA  B/l lower extremity DVT - due to sedentary lifestyle and possible underlying coagulopathic disorder- has been worked up in the past for hypercoagulable state but testing was negative - vascular surgery consulted for need for IVF filter as another PE will be life threatining for him- they do not feel  he will benefit from a filter  Interstitial lung disease - has been evaluated by pulmonary recently and is being followed-  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 Will consult pt 8-19 Consultants:   PCCM  Procedures:  Venous Duplex LE 01/13/16 Left: DVT noted in the common femoral vein, Prominal and distal femoral . Right:  DVT noted in the Popliteal vein and the posterior tibial vein.   8/12 ECHO Normal LV systolic function; grade 1 diastolic dysfunction; trace   MR; mild TR; mildly elevated pulmonary pressure.  Antimicrobials:  Anti-infectives    Start     Dose/Rate Route Frequency Ordered Stop   01/18/16 1800  piperacillin-tazobactam (ZOSYN) IVPB 3.375 g     3.375 g 12.5 mL/hr over 240 Minutes Intravenous Every 8 hours 01/18/16 1010     01/18/16 1200  sulfamethoxazole-trimethoprim (BACTRIM DS,SEPTRA DS) 800-160 MG per tablet 2 tablet     2 tablet Oral Every 8 hours 01/18/16 1117     01/18/16 1100  piperacillin-tazobactam (ZOSYN) IVPB 3.375 g     3.375 g 100 mL/hr over 30 Minutes Intravenous  Once 01/18/16 1010 01/18/16 1053       Objective: Vitals:   01/20/16 0352 01/20/16 0400 01/20/16  0500 01/20/16 0600  BP:  (!) 108/49 (!) 105/57 111/60  Pulse:  75 69 79  Resp:  (!) 27 (!) 27 20  Temp: 98.3 F (36.8 C)     TempSrc: Oral     SpO2:  90% (!) 88% 93%  Weight:      Height:        Intake/Output Summary (Last 24 hours) at 01/20/16 0826 Last data filed at 01/20/16 0626  Gross per 24 hour  Intake              380 ml  Output             1500 ml  Net            -1120 ml   Filed Weights   01/13/16 0428 01/14/16 0400 01/19/16 0418  Weight: 66.4 kg (146 lb 6.2 oz)  63.2 kg (139 lb 5.3 oz) 63 kg (138 lb 14.2 oz)    Examination: General exam: Appears comfortable  HEENT: PERRLA, oral mucosa moist, no sclera icterus or thrush Respiratory system: Clear to auscultation. Respiratory effort normal.- pulse ox 90% on High flow oxygen.  Cardiovascular system: S1 & S2 heard, RRR.  No murmurs   Gastrointestinal system: Abdomen soft, non-tender, nondistended. Normal bowel sound. No organomegaly Central nervous system: Alert and oriented. No focal neurological deficits. Extremities: No cyanosis, clubbing - no significant LE edema Skin: No rashes or ulcers Psychiatry:  Mood & affect appropriate.     Data Reviewed: I have personally reviewed following labs and imaging studies  CBC:  Recent Labs Lab 01/16/16 0403 01/17/16 0343 01/18/16 0319 01/19/16 0351 01/20/16 0338  WBC 18.8* 18.2* 15.2* 16.1* 21.0*  HGB 12.3* 12.0* 11.2* 10.8* 12.3*  HCT 37.0* 36.1* 33.7* 32.2* 37.1*  MCV 121.7* 119.9* 119.1* 117.5* 117.4*  PLT 342 349 332 310 [id number]   Basic Metabolic Panel:  Recent Labs Lab 01/16/16 0403 01/16/16 1705 01/17/16 0343 01/19/16 0351 01/20/16 0338  NA 134* 135 137 136 135  K 5.6* 4.1 4.2 4.6 4.4  CL 104 104 104 105 104  CO2 23 25 25 24 22   GLUCOSE 259* 211* 246* 353* 349*  BUN 24* 18 19 25* 29*  CREATININE 0.60* 0.70 0.57* 0.71 1.09  CALCIUM 8.6* 8.5* 8.5* 8.2* 8.7*   GFR: Estimated Creatinine Clearance: 54.1 mL/min (by C-G formula based on SCr of 1.09 mg/dL). Liver Function Tests:  Recent Labs Lab 01/14/16 0228 01/18/16 0319  AST 139* 76*  ALT 259* 163*  ALKPHOS 242* 179*  BILITOT 3.7* 1.4*  PROT 6.3* 5.5*  ALBUMIN 3.0* 2.6*   No results for input(s): LIPASE, AMYLASE in the last 168 hours. No results for input(s): AMMONIA in the last 168 hours. Coagulation Profile: No results for input(s): INR, PROTIME in the last 168 hours. Cardiac Enzymes: No results for input(s): CKTOTAL, CKMB, CKMBINDEX, TROPONINI in the last 168  hours. BNP (last 3 results) No results for input(s): PROBNP in the last 8760 hours. HbA1C: No results for input(s): HGBA1C in the last 72 hours. CBG:  Recent Labs Lab 01/19/16 1208 01/19/16 1712 01/19/16 2201 01/20/16 0722  GLUCAP 474* 218* 356* 251*   Lipid Profile: No results for input(s): CHOL, HDL, LDLCALC, TRIG, CHOLHDL, LDLDIRECT in the last 72 hours. Thyroid Function Tests:  Recent Labs  01/18/16 1011  TSH 3.386   Anemia Panel: No results for input(s): VITAMINB12, FOLATE, FERRITIN, TIBC, IRON, RETICCTPCT in the last 72 hours. Urine analysis:    Component Value Date/Time   COLORURINE  AMBER (A) 05/18/2013 0830   APPEARANCEUR CLEAR 05/18/2013 0830   LABSPEC >1.046 (H) 05/18/2013 0830   PHURINE 5.5 05/18/2013 0830   GLUCOSEU NEGATIVE 05/18/2013 0830   HGBUR NEGATIVE 05/18/2013 0830   BILIRUBINUR NEGATIVE 05/18/2013 0830   KETONESUR NEGATIVE 05/18/2013 0830   PROTEINUR NEGATIVE 05/18/2013 0830   UROBILINOGEN 1.0 05/18/2013 0830   NITRITE NEGATIVE 05/18/2013 0830   LEUKOCYTESUR NEGATIVE 05/18/2013 0830   Sepsis Labs: @LABRCNTIP (procalcitonin:4,lacticidven:4) ) Recent Results (from the past 240 hour(s))  MRSA PCR Screening     Status: None   Collection Time: 01/18/16 10:29 AM  Result Value Ref Range Status   MRSA by PCR NEGATIVE NEGATIVE Final    Comment:        The GeneXpert MRSA Assay (FDA approved for NASAL specimens only), is one component of a comprehensive MRSA colonization surveillance program. It is not intended to diagnose MRSA infection nor to guide or monitor treatment for MRSA infections.   Culture, blood (routine x 2)     Status: None (Preliminary result)   Collection Time: 01/18/16 10:44 AM  Result Value Ref Range Status   Specimen Description BLOOD RIGHT ARM  Final   Special Requests IN PEDIATRIC BOTTLE 2CC  Final   Culture   Final    NO GROWTH < 24 HOURS Performed at [facility]    Report Status PENDING  Incomplete   Culture, blood (routine x 2)     Status: None (Preliminary result)   Collection Time: 01/18/16 10:53 AM  Result Value Ref Range Status   Specimen Description BLOOD RIGHT HAND  Final   Special Requests IN PEDIATRIC BOTTLE [place]  Final   Culture   Final    NO GROWTH < 24 HOURS Performed at [facility]    Report Status PENDING  Incomplete         Radiology Studies: No results found.    Scheduled Meds: . antiseptic oral rinse  7 mL Mouth Rinse BID  . apixaban  10 mg Oral BID   Followed by  . [START ON 01/23/2016] apixaban  5 mg Oral BID  . insulin aspart  0-15 Units Subcutaneous TID WC  . insulin aspart  0-5 Units Subcutaneous QHS  . magnesium oxide  200 mg Oral Daily  . methylPREDNISolone (SOLU-MEDROL) injection  80 mg Intravenous Q6H  . multivitamin with minerals  1 tablet Oral Daily  . pantoprazole  40 mg Oral BID  . piperacillin-tazobactam (ZOSYN)  IV  3.375 g Intravenous Q8H  . sodium chloride flush  3 mL Intravenous Q12H  . sulfamethoxazole-trimethoprim  2 tablet Oral Q8H   Continuous Infusions:     LOS: 8 days    Time spent in minutes: 35 minutes.     Elmarie Shiley, MD [facility] Pager:[phone] [email or web address] Password [facility] 01/20/2016, 8:26 AM

## 2016-01-20 NOTE — Progress Notes (Signed)
Inpatient Diabetes Program Recommendations  AACE/ADA: New Consensus Statement on Inpatient Glycemic Control (2015)  Target Ranges:  Prepandial:   less than 140 mg/dL      Peak postprandial:   less than 180 mg/dL (1-2 hours)      Critically ill patients:  140 - 180 mg/dL   Results for LACARLOS, RESPASS (MRN [id number]) as of 01/20/2016 09:15  Ref. Range 01/19/2016 12:08 01/19/2016 17:12 01/19/2016 22:01 01/20/2016 07:22  Glucose-Capillary Latest Ref Range: 65 - 99 mg/dL 474 (H) 218 (H) 356 (H) 251 (H)    Admit with: PE  History: ETOH Abuse    -Patient currently receiving Solumedrol 80 mg Q6 hours.  -Glucose levels significantly elevated.  -Started Novolog Moderate Correction Scale/ SSI (0-15 units) TID AC + HS yesterday at 12pm.     MD- Please consider the following in-hospital insulin adjustments while patient receiving IV Steroids:   1. Start Lantus 10 units daily (0.15 units/kg dosing)  2. Increase Novolog SSI to Novolog Resistant Correction Scale/ SSI (0-20 units) TID AC + HS       --Will follow patient during hospitalization--  Wyn Quaker RN, MSN, CDE Diabetes Coordinator Inpatient Glycemic Control Team Team Pager: [phone] (8a-5p)

## 2016-01-20 NOTE — Progress Notes (Signed)
PULMONARY / CRITICAL CARE MEDICINE   Name: Sean Jackson MRN: [id number] DOB: May 08, 1946    ADMISSION DATE:  01/24/2016 CONSULTATION DATE:  01/13/2016  REFERRING MD:  Dr. Wynelle Cleveland, [facility]  CHIEF COMPLAINT:  Short of breath  BRIEF SUMMARY: 70 y/o M with PMH of ETOH abuse, recent ETOH hepatitis with d/c on steroids (8/4), cirrhosis, unprovoked PE / DVT (previously on Xarelto, negative hypercoagulable workup), GGO on CT (? If related to PE), LLL pulmonary nodule and anemia who was admitted on 8/10 with 3-4 day hx of SOB.  Work up consistent with PE in the R pulmonary artery.  PCCM consulted for evaluation.  SUBJECTIVE:   No issues overnight. Has been feeling better x 2 days. Able to move more w/o being too SOB.  On HFNC heated > less FiO2 at 70% and 40 LPM.  No issues with eating. Less cough. No chest pain.  VITAL SIGNS: BP 111/60   Pulse 79   Temp 98.3 F (36.8 C) (Oral)   Resp 20   Ht 5\' 5"  (1.651 m)   Wt 63 kg (138 lb 14.2 oz)   SpO2 90%   BMI 23.11 kg/m   INTAKE / OUTPUT: I/O last 3 completed shifts: In: 620 [I.V.:340; Other:80; IV Piggyback:200] Out: 2400 [Urine:2400]  PHYSICAL EXAMINATION: General: pleasant, speaks in full sentences w/o significant accessory muscle use. Less resp distress. Neuro:  Alert, normal strength, CN intact (-) lateralizing signs.  HEENT:  Pupils reactive, mild scleral icterus, no LAN Cardiovascular:  Regular, no murmur Lungs:  Even/non-labored,Bibasilar crackles. no wheeze. Good ae. Mild accessory muscle use Abdomen:  Soft, non tender, + bowel sounds Musculoskeletal:  Mild Lt > Rt LE edema Skin:  Dry skin, no rashes or lesions  LABS:  BMET  Recent Labs Lab 01/17/16 0343 01/19/16 0351 01/20/16 0338  NA 137 136 135  K 4.2 4.6 4.4  CL 104 105 104  CO2 25 24 22   BUN 19 25* 29*  CREATININE 0.57* 0.71 1.09  GLUCOSE 246* 353* 349*    Electrolytes  Recent Labs Lab 01/17/16 0343 01/19/16 0351 01/20/16 0338  CALCIUM 8.5* 8.2*  8.7*    CBC  Recent Labs Lab 01/18/16 0319 01/19/16 0351 01/20/16 0338  WBC 15.2* 16.1* 21.0*  HGB 11.2* 10.8* 12.3*  HCT 33.7* 32.2* 37.1*  PLT 332 310 341    Coag's No results for input(s): APTT, INR in the last 168 hours.  Sepsis Markers  Recent Labs Lab 01/17/16 0343 01/18/16 1044 01/18/16 1407  LATICACIDVEN  --  2.5* 2.5*  PROCALCITON 0.16  --   --     ABG  Recent Labs Lab 01/16/16 0836  PHART 7.442  PCO2ART 37.0  PO2ART 57.3*    Liver Enzymes  Recent Labs Lab 01/14/16 0228 01/18/16 0319  AST 139* 76*  ALT 259* 163*  ALKPHOS 242* 179*  BILITOT 3.7* 1.4*  ALBUMIN 3.0* 2.6*    Cardiac Enzymes No results for input(s): TROPONINI, PROBNP in the last 168 hours.  Glucose  Recent Labs Lab 01/19/16 1208 01/19/16 1712 01/19/16 2201 01/20/16 0722  GLUCAP 474* 218* 356* 251*    Imaging No results found.   STUDIES:  PFT 12/13/15 >> FEV1 1.90 (73%), FEV1% 85, TLC 4.28 (71%), DLCO 39% CTA Chest 01/15/2016 >> PE Rt main PA, chronic PE Rt upper/middle/lower lobes, chronic PE Lt PA, RV:LV ratio 1, basilar BTX and interstitial changes b/l, Lt lung areas of GGO ECHO 01/13/16 >> mild LVH, EF 55 to [id number], grade 1 diastolic CHF,  mild increase PA pressures LE Venous Duplex 01/13/16 >> Lt common femoral DVT, Rt popliteal and posterior tibial DVT  ANTIBIOTICS:  Zosyn 8/16 > Bactrim 8/16 >   CULTURES:  Blood 8/16 > (-)  SIGNIFICANT EVENTS: 8/10  Admit with PE, started on heparin gtt 8/14  Transitioned to Eliquis, remains on HFO2 / NRB  LINES/TUBES:  DISCUSSION: 70 y/o M with PMH of ETOH abuse, recent ETOH hepatitis with d/c on steroids (8/4), cirrhosis, unprovoked PE / DVT (previously on Xarelto, negative hypercoagulable workup), GGO on CT (? If related to PE), LLL pulmonary nodule and anemia who was admitted on 8/10 with 3-4 day hx of SOB.  Work up consistent with PE in the R pulmonary artery.  Followed by Dr. Lamonte Sakai. PCCM consulted for evaluation.  DNR/DNI.   ASSESSMENT / PLAN:  PULMONARY A: Acute hypoxic respiratory failure. 2/2 the following: 1. Acute on chronic pulmonary embolism. Chest CT scan with acute filling defect in the right pulmonary artery. Per report, he has bilateral chronic filling defects in both pulmonary arteries. 2. Chest CT scan on admission showed groundglass like infiltrates in the left mid to lower lung zones. Based on x-ray, those have progressed. Consideration could be smoldering infection or opportunistic infection such as PCP pneumonia given him being on chronic steroids. There could also be an occult gram-negative pneumonia or aspiration pneumonia given his alcohol abuse. No history of MRSA or staph infection in the past. 3. Groundglass like infiltrates could also be inflammatory in nature. I'm not sure if he has underlying interstitial lung disease which is flaring up. CTD work up is (-).  4. Fluid overload was a consideration but he has been diuresced and he still remains hypoxemic. 5. I doubt he is in pulmonary edema/CHF exacerbation.  P:   Continue high flow nasal cannula (heated). He is currently on 40 L/m, 70% FiO2 now from 100% FiO2 on 8/17.  Continue Zosyn, Bactrim for now. Check LFTs in the morning. Continue steroids.  We need to address disposition next week. I mentioned to patient and his brother, likely needs to be transitioned to an LTAC or a rehabilitation place.  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 P:  Continue Eliquis  Will need lifelong anticoagulation  Monitor hemodynamics   RENAL A:   No acute issues. P:   Monitor renal fx, urine outpt, electrolytes  GASTROINTESTINAL A:   Alcoholic hepatitis. Transaminitis. P:   Cont IV steroids Check LFts in 1-2 days  HEMATOLOGIC A:   Leukocytosis. Likely 2/2 steroids.  Macrocytic Anemia - likely secondary to ETOH abuse P:  F/u CBC   INFECTIOUS A:   Possible PCP pneumonia, possible  gram-negative pneumonia. P:   cont Bactrim, Zosyn. Hold off on vancomycin. MRSA screen was negative. Blood culture (-) so far.   ENDOCRINE A:   No acute issues.   P:   Monitor blood sugar on BMET  NEUROLOGIC A:   No acute issues. Hx ETOH  P:   Monitor mental status Monitor for withdrawal symptoms   I extensively discussed with the patient and his youngest brother the overall condition and prognosis on 8/16. Patient and brother understand the severity of his illness. The understand that it is possible we cannot cut down the oxygen. Patient remains a DO NOT RESUSCITATE and DO NOT INTUBATE. Most likely he will go to [facility] or rehabilitation place once discharged from the hospital.   [place], MD 01/20/2016, 10:27 AM [facility] Pager [phone] After 3 pm or if no answer, call [phone]

## 2016-01-20 NOTE — Progress Notes (Signed)
Liter flow and O2 increased due to desaturation with eating.  RT to titrate as needed.

## 2016-01-20 NOTE — Clinical Social Work Note (Signed)
Clinical Social Work Assessment  Patient Details  Name: Sean Jackson MRN: [id number] Date of Birth: [birth year]  Date of referral:  01/20/16               Reason for consult:  Discharge Planning                Permission sought to share information with:    Permission granted to share information::     Name::        Agency::     Relationship::     Contact Information:     Housing/Transportation Living arrangements for the past 2 months:  Single Family Home Source of Information:  Patient, Other (Comment Required) (Brother, Psychologist, clinical) Patient Interpreter Needed:  None Criminal Activity/Legal Involvement Pertinent to Current Situation/Hospitalization:  No - Comment as needed Significant Relationships:  Siblings Lives with:  Siblings Do you feel safe going back to the place where you live?   (Unsure) Need for family participation in patient care:  Yes (Comment)  Care giving concerns: Pt / brother, [name], feel pt may need more help than is available at home following hospital d/c.   Social Worker assessment / plan:  Pt hospitalized from home on 01/31/2016 with a pulmonary embolism. CSW consulted to assist with d/c planning. CSW reviewed PN, spoke with MD, pt , brother, [name] [phone] ) . MD will order PT eval when medically appropriate. CSW explained to pt / brother once PT recommendations are available CSW will be able to assist with rehab placement if this is recommended by PT. Pt / brother are in agreement with this plan. CSW will continue to follow to assist with d/c planning needs.  Employment status:  Retired Forensic scientist:  Medicare PT Recommendations:  Not assessed at this time Information / Referral to community resources:     Patient/Family's Response to care:  Disposition to be determined.  Patient/Family's Understanding of and Emotional Response to Diagnosis, Current Treatment, and Prognosis:  Pt / brother are aware of pt's medical status.Pt shared concerns  regarding home situation. " I live with my brother who has COPD. We really can't help each other. I was talking with my other brother, [name], about looking into rehab." Support / reassurance provided.  Emotional Assessment Appearance:  Appears stated age Attitude/Demeanor/Rapport:  Other (cooperative) Affect (typically observed):  Pleasant, Appropriate Orientation:  Oriented to Self, Oriented to Place, Oriented to  Time, Oriented to Situation Alcohol / Substance use:  Not Applicable Psych involvement (Current and /or in the community):  No (Comment)  Discharge Needs  Concerns to be addressed:  Discharge Planning Concerns Readmission within the last 30 days:  Yes Current discharge risk:  None Barriers to Discharge:  No Barriers Identified   [name], [place] 01/20/2016, 1:14 PM

## 2016-01-21 LAB — BASIC METABOLIC PANEL
Anion gap: 9 (ref 5–15)
BUN: 26 mg/dL — AB (ref 6–20)
CALCIUM: 8.8 mg/dL — AB (ref 8.9–10.3)
CO2: 24 mmol/L (ref 22–32)
CREATININE: 0.84 mg/dL (ref 0.61–1.24)
Chloride: 105 mmol/L (ref 101–111)
Glucose, Bld: 191 mg/dL — ABNORMAL HIGH (ref 65–99)
Potassium: 4.3 mmol/L (ref 3.5–5.1)
SODIUM: 138 mmol/L (ref 135–145)

## 2016-01-21 LAB — HEPATIC FUNCTION PANEL
ALK PHOS: 168 U/L — AB (ref 38–126)
ALT: 165 U/L — AB (ref 17–63)
AST: 70 U/L — AB (ref 15–41)
Albumin: 3.1 g/dL — ABNORMAL LOW (ref 3.5–5.0)
BILIRUBIN DIRECT: 0.7 mg/dL — AB (ref 0.1–0.5)
Indirect Bilirubin: 0.7 mg/dL (ref 0.3–0.9)
TOTAL PROTEIN: 6.4 g/dL — AB (ref 6.5–8.1)
Total Bilirubin: 1.4 mg/dL — ABNORMAL HIGH (ref 0.3–1.2)

## 2016-01-21 LAB — GLUCOSE, CAPILLARY
GLUCOSE-CAPILLARY: 240 mg/dL — AB (ref 65–99)
GLUCOSE-CAPILLARY: 250 mg/dL — AB (ref 65–99)
Glucose-Capillary: 267 mg/dL — ABNORMAL HIGH (ref 65–99)
Glucose-Capillary: 237 mg/dL — ABNORMAL HIGH (ref 65–99)

## 2016-01-21 LAB — CBC
HCT: 37.8 % — ABNORMAL LOW (ref 39.0–52.0)
HEMOGLOBIN: 12.6 g/dL — AB (ref 13.0–17.0)
MCH: 38.5 pg — AB (ref 26.0–34.0)
MCHC: 33.3 g/dL (ref 30.0–36.0)
MCV: 115.6 fL — ABNORMAL HIGH (ref 78.0–100.0)
PLATELETS: 291 10*3/uL (ref 150–400)
RBC: 3.27 MIL/uL — ABNORMAL LOW (ref 4.22–5.81)
RDW: 16.7 % — AB (ref 11.5–15.5)
WBC: 16.7 10*3/uL — ABNORMAL HIGH (ref 4.0–10.5)

## 2016-01-21 MED ORDER — NYSTATIN 100000 UNIT/GM EX POWD
Freq: Four times a day (QID) | CUTANEOUS | Status: DC | PRN
  Administered 2016-01-21 – 2016-01-22 (×6): via TOPICAL
  Filled 2016-01-21: qty 15

## 2016-01-21 MED ORDER — NYSTATIN 100000 UNIT/ML MT SUSP
5.0000 mL | Freq: Four times a day (QID) | OROMUCOSAL | Status: DC
  Administered 2016-01-21 – 2016-01-25 (×8): 500000 [IU] via ORAL
  Filled 2016-01-21 (×8): qty 5

## 2016-01-21 MED ORDER — INSULIN GLARGINE 100 UNIT/ML ~~LOC~~ SOLN
10.0000 [IU] | Freq: Every day | SUBCUTANEOUS | Status: DC
Start: 2016-01-21 — End: 2016-01-23
  Administered 2016-01-21 – 2016-01-23 (×3): 10 [IU] via SUBCUTANEOUS
  Filled 2016-01-21 (×3): qty 0.1

## 2016-01-21 NOTE — Progress Notes (Signed)
PULMONARY / CRITICAL CARE MEDICINE   Name: Sean Jackson MRN: [id number] DOB: [birth year]    ADMISSION DATE:  01/24/2016 CONSULTATION DATE:  01/13/2016  REFERRING MD:  Dr. Wynelle Cleveland, [facility]  CHIEF COMPLAINT:  Short of breath  BRIEF SUMMARY: 70 y/o M with PMH of ETOH abuse, recent ETOH hepatitis with d/c on steroids (8/4), cirrhosis, unprovoked PE / DVT (previously on Xarelto, negative hypercoagulable workup), GGO on CT (? If related to PE), LLL pulmonary nodule and anemia who was admitted on 8/10 with 3-4 day hx of SOB.  Work up consistent with PE in the R pulmonary artery.  PCCM consulted for evaluation.  SUBJECTIVE:   No issues overnight. Still short of breath but not worse. Ended up being an 100% FiO2 with more exertion and  eating.  VITAL SIGNS: BP 121/68   Pulse 93   Temp 98.3 F (36.8 C) (Oral)   Resp (!) 21   Ht 5\' 5"  (1.651 m)   Wt 63 kg (138 lb 14.2 oz)   SpO2 94%   BMI 23.11 kg/m   INTAKE / OUTPUT: I/O last 3 completed shifts: In: 480 [I.V.:280; IV Piggyback:200] Out: [id number] [Urine:1405]  PHYSICAL EXAMINATION: General: pleasant, speaks in full sentences w/o significant accessory muscle use. Less resp distress. Neuro:  Alert, normal strength, CN intact (-) lateralizing signs.  HEENT:  Pupils reactive, mild scleral icterus, no LAN Cardiovascular:  Regular, no murmur Lungs:  Even/non-labored,Bibasilar crackles. no wheeze. Good ae. Mild accessory muscle use Abdomen:  Soft, non tender, + bowel sounds Musculoskeletal:  Mild Lt > Rt LE edema Skin:  Dry skin, no rashes or lesions  LABS:  BMET  Recent Labs Lab 01/19/16 0351 01/20/16 0338 01/21/16 0315  NA 136 135 138  K 4.6 4.4 4.3  CL 105 104 105  CO2 24 22 24   BUN 25* 29* 26*  CREATININE 0.71 1.09 0.84  GLUCOSE 353* 349* 191*    Electrolytes  Recent Labs Lab 01/19/16 0351 01/20/16 0338 01/21/16 0315  CALCIUM 8.2* 8.7* 8.8*    CBC  Recent Labs Lab 01/19/16 0351 01/20/16 0338 01/21/16 0315   WBC 16.1* 21.0* 16.7*  HGB 10.8* 12.3* 12.6*  HCT 32.2* 37.1* 37.8*  PLT 310 341 291    Coag's No results for input(s): APTT, INR in the last 168 hours.  Sepsis Markers  Recent Labs Lab 01/17/16 0343 01/18/16 1044 01/18/16 1407  LATICACIDVEN  --  2.5* 2.5*  PROCALCITON 0.16  --   --     ABG  Recent Labs Lab 01/16/16 0836  PHART 7.442  PCO2ART 37.0  PO2ART 57.3*    Liver Enzymes  Recent Labs Lab 01/18/16 0319 01/21/16 0315  AST 76* 70*  ALT 163* 165*  ALKPHOS 179* 168*  BILITOT 1.4* 1.4*  ALBUMIN 2.6* 3.1*    Cardiac Enzymes No results for input(s): TROPONINI, PROBNP in the last 168 hours.  Glucose  Recent Labs Lab 01/19/16 2201 01/20/16 0722 01/20/16 1208 01/20/16 1657 01/20/16 2310 01/21/16 0732  GLUCAP 356* 251* 228* 344* 270* 267*    Imaging No results found.   STUDIES:  PFT 12/13/15 >> FEV1 1.90 (73%), FEV1% 85, TLC 4.28 (71%), DLCO 39% CTA Chest 01/30/2016 >> PE Rt main PA, chronic PE Rt upper/middle/lower lobes, chronic PE Lt PA, RV:LV ratio 1, basilar BTX and interstitial changes b/l, Lt lung areas of GGO ECHO 01/13/16 >> mild LVH, EF 55 to [id number], grade 1 diastolic CHF, mild increase PA pressures LE Venous Duplex 01/13/16 >> Lt common femoral  DVT, Rt popliteal and posterior tibial DVT  ANTIBIOTICS:  Zosyn 8/16 > Bactrim 8/16 >   CULTURES:  Blood 8/16 > (-)  SIGNIFICANT EVENTS: 8/10  Admit with PE, started on heparin gtt 8/14  Transitioned to Eliquis, remains on HFO2 / NRB  LINES/TUBES:  DISCUSSION: 70 y/o M with PMH of ETOH abuse, recent ETOH hepatitis with d/c on steroids (8/4), cirrhosis, unprovoked PE / DVT (previously on Xarelto, negative hypercoagulable workup), GGO on CT (? If related to PE), LLL pulmonary nodule and anemia who was admitted on 8/10 with 3-4 day hx of SOB.  Work up consistent with PE in the R pulmonary artery.  Followed by Dr. Lamonte Sakai. PCCM consulted for evaluation. DNR/DNI.   ASSESSMENT /  PLAN:  PULMONARY A: Acute hypoxic respiratory failure. 2/2 the following: 1. Acute on chronic pulmonary embolism. Chest CT scan with acute filling defect in the right pulmonary artery. Per report, he has bilateral chronic filling defects in both pulmonary arteries. 2. Chest CT scan on admission showed groundglass like infiltrates in the left mid to lower lung zones. Based on x-ray, those have progressed. Consideration could be smoldering infection or opportunistic infection such as PCP pneumonia given him being on chronic steroids. There could also be an occult gram-negative pneumonia or aspiration pneumonia given his alcohol abuse. No history of MRSA or staph infection in the past. 3. Groundglass like infiltrates could also be inflammatory in nature. I'm not sure if he has underlying interstitial lung disease which is flaring up. CTD work up is (-).  4. Fluid overload was a consideration but he has been diuresced and he still remains hypoxemic. 5. I doubt he is in pulmonary edema/CHF exacerbation.  P:   Continue high flow nasal cannula (heated). He is currently on 40 L/m, 70%- 100% FiO2. Keep o2 sats > 88%.  Continue Zosyn, Bactrim for now.  Continue steroids.  We need to address disposition next week. I mentioned to patient and his brother, likely needs to be transitioned to an LTAC or a rehabilitation place.  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 P:  Continue Eliquis  Will need lifelong anticoagulation  Monitor hemodynamics   RENAL A:   No acute issues. P:   Monitor renal fx, urine outpt, electrolytes  GASTROINTESTINAL A:   Alcoholic hepatitis. Transaminitis. P:   Cont IV steroids LFTs reassuring.   HEMATOLOGIC A:   Leukocytosis. Likely 2/2 steroids.  Macrocytic Anemia - likely secondary to ETOH abuse P:  F/u CBC   INFECTIOUS A:   Possible PCP pneumonia, possible gram-negative pneumonia. P:   cont Bactrim, Zosyn. Hold off on  vancomycin. MRSA screen was negative. Blood culture (-) so far.  Check CXR in am  ENDOCRINE A:   No acute issues.   P:   Monitor blood sugar on BMET  NEUROLOGIC A:   No acute issues. Hx ETOH  P:   Monitor mental status Monitor for withdrawal symptoms   I extensively discussed with the patient and his youngest brother the overall condition and prognosis on 8/16. Patient and brother understand the severity of his illness. The understand that it is possible we cannot cut down the oxygen. Patient remains a DO NOT RESUSCITATE and DO NOT INTUBATE. Most likely he will go to [facility] or rehabilitation place once discharged from the hospital.   [place], MD 01/21/2016, 8:59 AM [facility] Pager [phone] After 3 pm or if no answer,  call [phone]

## 2016-01-21 NOTE — Progress Notes (Signed)
PROGRESS NOTE    Sean Jackson  [id number] DOB: 11/07/1945 DOA: 01/14/2016  PCP: Aretta Nip, MD   Brief Narrative:  Sean Jackson is a 70 y.o. male with medical history significant of alcohol abuse with recent admission for alcoholic hepatitis discharged on 8/4 on steroid treatment, cirrhosis, PE in the past (Xarelto in 2014) and anemia who presents for dyspnea x 3-4 days. Found to be hypoxic in the ER. CT chest shows acute PE in right main pulmonary artery . His disposition is difficult as he continues to require high levels of O2.   Subjective: Feeling ok, de-sat while eating.  Denies dyspnea, feeling ok.   Assessment & Plan:   Acute on chronic hypoxic respiratory failure. 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- h/o left leg DVT 5 yrs prior to this- he was on Xarelto which was discontinued on 9/15 by his pulmonologist as no genetic predisposition/ hypercoagulable state was found - he admits that he had been more sendentary over the past few weeks - venous duplex of legs and b/l IVC showing b/l DVT including in left iliac vein - previously not on 02 at home -  still quite hypoxic and requiring non-re breather- needed BiPAP at one point for hypoxia - has underlying lung disease with fibrosis and bronchiectasis and persistent chronic PEs which may be contributing to the hypoxia  - pulmonary following- no indication for thrombolysis - switched to Eliquis which he is tolerating- anticoagulation needs to be life long -on IV steroids and antibiotics to cover for opportunistic infection, PNA. -observed over weekend to see if oxygen requirement improved, but still de-sat while talking. This will limit activity. 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    DVT prophylaxis: Eliquis Code Status: DNR Family Communication:  Disposition Plan: to be determine. Will consult pt 8-19 Consultants:   PCCM  Procedures:  Venous Duplex LE 01/13/16 Left: DVT noted in the common femoral vein, Prominal and distal femoral . Right:  DVT noted in the Popliteal vein and the posterior tibial vein.   8/12 ECHO Normal LV systolic function; grade 1 diastolic dysfunction; trace   MR; mild TR; mildly elevated pulmonary pressure.  Antimicrobials:  Anti-infectives    Start     Dose/Rate Route Frequency Ordered Stop   01/18/16 1800  piperacillin-tazobactam (ZOSYN) IVPB 3.375 g     3.375 g 12.5 mL/hr over 240 Minutes Intravenous Every 8 hours 01/18/16 1010     01/18/16 1200  sulfamethoxazole-trimethoprim (BACTRIM DS,SEPTRA DS) 800-160 MG per tablet 2 tablet     2 tablet Oral Every 8 hours 01/18/16 1117     01/18/16 1100  piperacillin-tazobactam (ZOSYN) IVPB 3.375 g  3.375 g 100 mL/hr over 30 Minutes Intravenous  Once 01/18/16 1010 01/18/16 1053       Objective: Vitals:   01/21/16 0400 01/21/16 0500 01/21/16 0600 01/21/16 0700  BP: 108/63 (!) 92/48 (!) 99/49 (!) 118/54  Pulse: 72 64 65 75  Resp: (!) 22 20 20  (!) 21  Temp:      TempSrc:      SpO2: 100% 95% 95% 95%  Weight:      Height:        Intake/Output Summary (Last 24 hours) at 01/21/16 0808 Last data filed at 01/21/16 0700  Gross per 24 hour    Intake              280 ml  Output              780 ml  Net             -500 ml   Filed Weights   01/13/16 0428 01/14/16 0400 01/19/16 0418  Weight: 66.4 kg (146 lb 6.2 oz) 63.2 kg (139 lb 5.3 oz) 63 kg (138 lb 14.2 oz)    Examination: General exam: Appears comfortable  HEENT: PERRLA, oral mucosa moist, no sclera icterus or thrush Respiratory system: Clear to auscultation. Respiratory effort normal.- pulse ox 90% on High flow oxygen.  Cardiovascular system: S1 & S2 heard, RRR.  No murmurs   Gastrointestinal system: Abdomen soft, non-tender, nondistended. Normal bowel sound. No organomegaly Central nervous system: Alert and oriented. No focal neurological deficits. Extremities: No cyanosis, clubbing - no significant LE edema Skin: No rashes or ulcers Psychiatry:  Mood & affect appropriate.     Data Reviewed: I have personally reviewed following labs and imaging studies  CBC:  Recent Labs Lab 01/17/16 0343 01/18/16 0319 01/19/16 0351 01/20/16 0338 01/21/16 0315  WBC 18.2* 15.2* 16.1* 21.0* 16.7*  HGB 12.0* 11.2* 10.8* 12.3* 12.6*  HCT 36.1* 33.7* 32.2* 37.1* 37.8*  MCV 119.9* 119.1* 117.5* 117.4* 115.6*  PLT 349 332 310 341 [id number]   Basic Metabolic Panel:  Recent Labs Lab 01/16/16 1705 01/17/16 0343 01/19/16 0351 01/20/16 0338 01/21/16 0315  NA 135 137 136 135 138  K 4.1 4.2 4.6 4.4 4.3  CL 104 104 105 104 105  CO2 25 25 24 22 24   GLUCOSE 211* 246* 353* 349* 191*  BUN 18 19 25* 29* 26*  CREATININE 0.70 0.57* 0.71 1.09 0.84  CALCIUM 8.5* 8.5* 8.2* 8.7* 8.8*   GFR: Estimated Creatinine Clearance: 70.2 mL/min (by C-G formula based on SCr of 0.84 mg/dL). Liver Function Tests:  Recent Labs Lab 01/18/16 0319 01/21/16 0315  AST 76* 70*  ALT 163* 165*  ALKPHOS 179* 168*  BILITOT 1.4* 1.4*  PROT 5.5* 6.4*  ALBUMIN 2.6* 3.1*   No results for input(s): LIPASE, AMYLASE in the last 168 hours. No results for input(s): AMMONIA in the last 168 hours. Coagulation  Profile: No results for input(s): INR, PROTIME in the last 168 hours. Cardiac Enzymes: No results for input(s): CKTOTAL, CKMB, CKMBINDEX, TROPONINI in the last 168 hours. BNP (last 3 results) No results for input(s): PROBNP in the last 8760 hours. HbA1C: No results for input(s): HGBA1C in the last 72 hours. CBG:  Recent Labs Lab 01/20/16 0722 01/20/16 1208 01/20/16 1657 01/20/16 2310 01/21/16 0732  GLUCAP 251* 228* 344* 270* 267*   Lipid Profile: No results for input(s): CHOL, HDL, LDLCALC, TRIG, CHOLHDL, LDLDIRECT in the last 72 hours. Thyroid Function Tests:  Recent Labs  01/18/16 1011  TSH 3.386   Anemia Panel: No results for input(s): VITAMINB12, FOLATE, FERRITIN, TIBC, IRON, RETICCTPCT in the last 72 hours. Urine analysis:    Component Value Date/Time   COLORURINE AMBER (A) 05/18/2013 0830   APPEARANCEUR CLEAR 05/18/2013 0830   LABSPEC >1.046 (H) 05/18/2013 0830   PHURINE 5.5 05/18/2013 0830   GLUCOSEU NEGATIVE 05/18/2013 0830   HGBUR NEGATIVE 05/18/2013 0830   BILIRUBINUR NEGATIVE 05/18/2013 0830   KETONESUR NEGATIVE 05/18/2013 0830   PROTEINUR NEGATIVE 05/18/2013 0830   UROBILINOGEN 1.0 05/18/2013 0830   NITRITE NEGATIVE 05/18/2013 0830   LEUKOCYTESUR NEGATIVE 05/18/2013 0830   Sepsis Labs: @LABRCNTIP (procalcitonin:4,lacticidven:4) ) Recent Results (from the past 240 hour(s))  MRSA PCR Screening     Status: None   Collection Time: 01/18/16 10:29 AM  Result Value Ref Range Status   MRSA by PCR NEGATIVE NEGATIVE Final    Comment:        The GeneXpert MRSA Assay (FDA approved for NASAL specimens only), is one component of a comprehensive MRSA colonization surveillance program. It is not intended to diagnose MRSA infection nor to guide or monitor treatment for MRSA infections.   Culture, blood (routine x 2)     Status: None (Preliminary result)   Collection Time: 01/18/16 10:44 AM  Result Value Ref Range Status   Specimen Description BLOOD RIGHT  ARM  Final   Special Requests IN PEDIATRIC BOTTLE 2CC  Final   Culture   Final    NO GROWTH 2 DAYS Performed at [facility]    Report Status PENDING  Incomplete  Culture, blood (routine x 2)     Status: None (Preliminary result)   Collection Time: 01/18/16 10:53 AM  Result Value Ref Range Status   Specimen Description BLOOD RIGHT HAND  Final   Special Requests IN PEDIATRIC BOTTLE [place]  Final   Culture   Final    NO GROWTH 2 DAYS Performed at [facility]    Report Status PENDING  Incomplete         Radiology Studies: No results found.    Scheduled Meds: . antiseptic oral rinse  7 mL Mouth Rinse BID  . apixaban  10 mg Oral BID   Followed by  . [START ON 01/23/2016] apixaban  5 mg Oral BID  . insulin aspart  0-15 Units Subcutaneous TID WC  . insulin aspart  0-5 Units Subcutaneous QHS  . magnesium oxide  200 mg Oral Daily  . methylPREDNISolone (SOLU-MEDROL) injection  80 mg Intravenous Q6H  . multivitamin with minerals  1 tablet Oral Daily  . pantoprazole  40 mg Oral BID  . piperacillin-tazobactam (ZOSYN)  IV  3.375 g Intravenous Q8H  . sodium chloride flush  3 mL Intravenous Q12H  . sulfamethoxazole-trimethoprim  2 tablet Oral Q8H   Continuous Infusions:     LOS: 9 days    Time spent in minutes: 35 minutes.     Elmarie Shiley, MD [facility] [phone] [email or web address] Password [email or web address] 01/21/2016, 8:08 AM

## 2016-01-22 ENCOUNTER — Inpatient Hospital Stay (HOSPITAL_COMMUNITY): Payer: Medicare Other

## 2016-01-22 LAB — BASIC METABOLIC PANEL
ANION GAP: 9 (ref 5–15)
BUN: 24 mg/dL — ABNORMAL HIGH (ref 6–20)
CALCIUM: 9 mg/dL (ref 8.9–10.3)
CO2: 25 mmol/L (ref 22–32)
CREATININE: 0.87 mg/dL (ref 0.61–1.24)
Chloride: 103 mmol/L (ref 101–111)
Glucose, Bld: 214 mg/dL — ABNORMAL HIGH (ref 65–99)
Potassium: 4.5 mmol/L (ref 3.5–5.1)
Sodium: 137 mmol/L (ref 135–145)

## 2016-01-22 LAB — GLUCOSE, CAPILLARY
Glucose-Capillary: 188 mg/dL — ABNORMAL HIGH (ref 65–99)
Glucose-Capillary: 126 mg/dL — ABNORMAL HIGH (ref 65–99)
Glucose-Capillary: 242 mg/dL — ABNORMAL HIGH (ref 65–99)
Glucose-Capillary: 166 mg/dL — ABNORMAL HIGH (ref 65–99)

## 2016-01-22 LAB — CBC
HCT: 39.3 % (ref 39.0–52.0)
Hemoglobin: 13 g/dL (ref 13.0–17.0)
MCH: 38.1 pg — ABNORMAL HIGH (ref 26.0–34.0)
MCHC: 33.1 g/dL (ref 30.0–36.0)
MCV: 115.2 fL — ABNORMAL HIGH (ref 78.0–100.0)
PLATELETS: 271 10*3/uL (ref 150–400)
RBC: 3.41 MIL/uL — ABNORMAL LOW (ref 4.22–5.81)
RDW: 16.9 % — AB (ref 11.5–15.5)
WBC: 24.5 10*3/uL — AB (ref 4.0–10.5)

## 2016-01-22 MED ORDER — VANCOMYCIN HCL IN DEXTROSE 750-5 MG/150ML-% IV SOLN
750.0000 mg | Freq: Two times a day (BID) | INTRAVENOUS | Status: DC
  Administered 2016-01-22: 750 mg via INTRAVENOUS
  Filled 2016-01-22 (×2): qty 150

## 2016-01-22 MED ORDER — VANCOMYCIN HCL IN DEXTROSE 1-5 GM/200ML-% IV SOLN
1000.0000 mg | Freq: Once | INTRAVENOUS | Status: AC
  Administered 2016-01-22: 1000 mg via INTRAVENOUS
  Filled 2016-01-22: qty 200

## 2016-01-22 NOTE — Progress Notes (Signed)
PROGRESS NOTE    Sean Jackson  [id number] DOB: [birth year] DOA: 01/04/2016  PCP: Aretta Nip, MD   Brief Narrative:  Sean Jackson is a 70 y.o. male with medical history significant of alcohol abuse with recent admission for alcoholic hepatitis discharged on 8/4 on steroid treatment, cirrhosis, PE in the past (Xarelto in 2014) and anemia who presents for dyspnea x 3-4 days. Found to be hypoxic in the ER. CT chest shows acute PE in right main pulmonary artery . His disposition is difficult as he continues to require high levels of O2.   Subjective: Not feeling well because of his lung problems.  Denies worsening dyspnea.   Assessment & Plan:   Acute on chronic hypoxic respiratory failure. 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- h/o left leg DVT 5 yrs prior to this- he was on Xarelto which was discontinued on 9/15 by his pulmonologist as no genetic predisposition/ hypercoagulable state was found - he admits that he had been more sendentary over the past few weeks - venous duplex of legs and b/l IVC showing b/l DVT including in left iliac vein - previously not on 02 at home -  still quite hypoxic and requiring non-re breather- needed BiPAP at one point for hypoxia - has underlying lung disease with fibrosis and bronchiectasis and persistent chronic PEs which may be contributing to the hypoxia  - pulmonary following- no indication for thrombolysis - switched to Eliquis which he is tolerating- anticoagulation needs to be life long -on IV steroids and antibiotics to cover for opportunistic infection, PNA. -observed over weekend to see if oxygen requirement improved. PT tomorrow.  -Chest x ray with worsening aeration. 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    DVT prophylaxis: Eliquis Code Status: DNR Family Communication:  Disposition Plan: to be determine. Will consult pt 8-19 Consultants:   PCCM  Procedures:  Venous Duplex LE 01/13/16 Left: DVT noted in the common femoral vein, Prominal and distal femoral . Right:  DVT noted in the Popliteal vein and the posterior tibial vein.   8/12 ECHO Normal LV systolic function; grade 1 diastolic dysfunction; trace   MR; mild TR; mildly elevated pulmonary pressure.  Antimicrobials:  Anti-infectives    Start     Dose/Rate Route Frequency Ordered Stop   01/18/16 1800  piperacillin-tazobactam (ZOSYN) IVPB 3.375 g     3.375 g 12.5 mL/hr over 240 Minutes Intravenous Every 8 hours 01/18/16 1010     01/18/16 1200  sulfamethoxazole-trimethoprim (BACTRIM DS,SEPTRA DS) 800-160 MG per tablet 2 tablet     2 tablet Oral Every 8 hours 01/18/16 1117     01/18/16 1100  piperacillin-tazobactam (ZOSYN) IVPB 3.375 g     3.375 g  100 mL/hr over 30 Minutes Intravenous  Once 01/18/16 1010 01/18/16 1053       Objective: Vitals:   01/22/16 0600 01/22/16 0640 01/22/16 0656 01/22/16 0700  BP: (!) 107/49   132/65  Pulse: (!) 51 79 98 88  Resp: (!) 24 (!) 25 (!) 22 (!) 25  Temp:      TempSrc:      SpO2: 97% 93% (!) 86% 90%  Weight:      Height:        Intake/Output Summary (Last 24 hours) at 01/22/16 [id number] Last data filed at 01/22/16 0500  Gross per 24 hour  Intake               845 ml  Output              770 ml  Net               75 ml   Filed Weights   01/13/16 0428 01/14/16 0400 01/19/16 0418  Weight: 66.4 kg (146 lb 6.2 oz) 63.2 kg (139 lb 5.3 oz) 63 kg (138 lb 14.2 oz)    Examination: General exam: Appears comfortable  HEENT: PERRLA, oral mucosa moist, no sclera icterus or thrush Respiratory system: Clear to auscultation. Respiratory effort normal.- pulse ox 90% on High flow oxygen.  Cardiovascular system: S1 & S2 heard, RRR.  No murmurs   Gastrointestinal system: Abdomen soft, non-tender, nondistended. Normal bowel sound. No organomegaly Central nervous system: Alert and oriented. No focal neurological deficits. Extremities: No cyanosis, clubbing - no significant LE edema Skin: No rashes or ulcers Psychiatry:  Mood & affect appropriate.     Data Reviewed: I have personally reviewed following labs and imaging studies  CBC:  Recent Labs Lab 01/18/16 0319 01/19/16 0351 01/20/16 0338 01/21/16 0315 01/22/16 0348  WBC 15.2* 16.1* 21.0* 16.7* 24.5*  HGB 11.2* 10.8* 12.3* 12.6* 13.0  HCT 33.7* 32.2* 37.1* 37.8* 39.3  MCV 119.1* 117.5* 117.4* 115.6* 115.2*  PLT 332 310 341 291 [id number]   Basic Metabolic Panel:  Recent Labs Lab 01/17/16 0343 01/19/16 0351 01/20/16 0338 01/21/16 0315 01/22/16 0348  NA 137 136 135 138 137  K 4.2 4.6 4.4 4.3 4.5  CL 104 105 104 105 103  CO2 25 24 22 24 25   GLUCOSE 246* 353* 349* 191* 214*  BUN 19 25* 29* 26* 24*  CREATININE 0.57* 0.71 1.09 0.84 0.87  CALCIUM 8.5* 8.2* 8.7* 8.8* 9.0   GFR: Estimated Creatinine Clearance: 67.7 mL/min (by C-G formula based on SCr of 0.87 mg/dL). Liver Function Tests:  Recent Labs Lab 01/18/16 0319 01/21/16 0315  AST 76* 70*  ALT 163* 165*  ALKPHOS 179* 168*  BILITOT 1.4* 1.4*  PROT 5.5* 6.4*  ALBUMIN 2.6* 3.1*   No results for input(s): LIPASE, AMYLASE in the last 168 hours. No results for input(s): AMMONIA in the last 168 hours. Coagulation Profile: No  results for input(s): INR, PROTIME in the last 168 hours. Cardiac Enzymes: No results for input(s): CKTOTAL, CKMB, CKMBINDEX, TROPONINI in the last 168 hours. BNP (last 3 results) No results for input(s): PROBNP in the last 8760 hours. HbA1C: No results for input(s): HGBA1C in the last 72 hours. CBG:  Recent Labs Lab 01/21/16 0732 01/21/16 1146 01/21/16 1601 01/21/16 2138 01/22/16 0749  GLUCAP 267* 237* 240* 250* 188*   Lipid Profile: No results for input(s): CHOL, HDL, LDLCALC, TRIG, CHOLHDL, LDLDIRECT in the last 72 hours. Thyroid Function Tests: No results for input(s): TSH, T4TOTAL,  FREET4, T3FREE, THYROIDAB in the last 72 hours. Anemia Panel: No results for input(s): VITAMINB12, FOLATE, FERRITIN, TIBC, IRON, RETICCTPCT in the last 72 hours. Urine analysis:    Component Value Date/Time   COLORURINE AMBER (A) 05/18/2013 0830   APPEARANCEUR CLEAR 05/18/2013 0830   LABSPEC >1.046 (H) 05/18/2013 0830   PHURINE 5.5 05/18/2013 0830   GLUCOSEU NEGATIVE 05/18/2013 0830   HGBUR NEGATIVE 05/18/2013 0830   BILIRUBINUR NEGATIVE 05/18/2013 0830   KETONESUR NEGATIVE 05/18/2013 0830   PROTEINUR NEGATIVE 05/18/2013 0830   UROBILINOGEN 1.0 05/18/2013 0830   NITRITE NEGATIVE 05/18/2013 0830   LEUKOCYTESUR NEGATIVE 05/18/2013 0830   Sepsis Labs: @LABRCNTIP (procalcitonin:4,lacticidven:4) ) Recent Results (from the past 240 hour(s))  MRSA PCR Screening     Status: None   Collection Time: 01/18/16 10:29 AM  Result Value Ref Range Status   MRSA by PCR NEGATIVE NEGATIVE Final    Comment:        The GeneXpert MRSA Assay (FDA approved for NASAL specimens only), is one component of a comprehensive MRSA colonization surveillance program. It is not intended to diagnose MRSA infection nor to guide or monitor treatment for MRSA infections.   Culture, blood (routine x 2)     Status: None (Preliminary result)   Collection Time: 01/18/16 10:44 AM  Result Value Ref Range Status    Specimen Description BLOOD RIGHT ARM  Final   Special Requests IN PEDIATRIC BOTTLE 2CC  Final   Culture   Final    NO GROWTH 3 DAYS Performed at [facility]    Report Status PENDING  Incomplete  Culture, blood (routine x 2)     Status: None (Preliminary result)   Collection Time: 01/18/16 10:53 AM  Result Value Ref Range Status   Specimen Description BLOOD RIGHT HAND  Final   Special Requests IN PEDIATRIC BOTTLE [place]  Final   Culture   Final    NO GROWTH 3 DAYS Performed at [facility]    Report Status PENDING  Incomplete         Radiology Studies: Dg Chest Port 1 View  Result Date: 01/22/2016 CLINICAL DATA:  Respiratory failure. Interstitial lung disease. Acute on chronic PE. EXAM: PORTABLE CHEST 1 VIEW COMPARISON:  01/18/2016. FINDINGS: Cardiomegaly. Monitoring device traverses the esophagus, and lies in the stomach or beyond. Worsening LEFT lower lobe infiltrate, with air bronchograms, and consolidation, suspected pneumonia with small effusion. Stable findings on the RIGHT. IMPRESSION: Worsening aeration. Electronically Signed   By: Staci Righter M.D.   On: 01/22/2016 07:29      Scheduled Meds: . antiseptic oral rinse  7 mL Mouth Rinse BID  . apixaban  10 mg Oral BID   Followed by  . [START ON 01/23/2016] apixaban  5 mg Oral BID  . insulin aspart  0-15 Units Subcutaneous TID WC  . insulin aspart  0-5 Units Subcutaneous QHS  . insulin glargine  10 Units Subcutaneous Daily  . magnesium oxide  200 mg Oral Daily  . methylPREDNISolone (SOLU-MEDROL) injection  80 mg Intravenous Q6H  . multivitamin with minerals  1 tablet Oral Daily  . nystatin  5 mL Oral QID  . pantoprazole  40 mg Oral BID  . piperacillin-tazobactam (ZOSYN)  IV  3.375 g Intravenous Q8H  . sodium chloride flush  3 mL Intravenous Q12H  . sulfamethoxazole-trimethoprim  2 tablet Oral Q8H   Continuous Infusions:     LOS: 10 days    Time spent in minutes: 35 minutes.  Elmarie Shiley, MD [facility] Pager:[phone] [email or web address] Password [email or web address] 01/22/2016, 8:22 AM

## 2016-01-22 NOTE — Progress Notes (Signed)
PULMONARY / CRITICAL CARE MEDICINE   Name: Sean Jackson MRN: [id number] DOB: March 22, 1946    ADMISSION DATE:  01/23/2016 CONSULTATION DATE:  01/13/2016  REFERRING MD:  Dr. Wynelle Cleveland, [facility]  CHIEF COMPLAINT:  Short of breath  BRIEF SUMMARY: 70 y/o M with PMH of ETOH abuse, recent ETOH hepatitis with d/c on steroids (8/4), cirrhosis, unprovoked PE / DVT (previously on Xarelto, negative hypercoagulable workup), GGO on CT (? If related to PE), LLL pulmonary nodule and anemia who was admitted on 8/10 with 3-4 day hx of SOB.  Work up consistent with PE in the R pulmonary artery.  PCCM consulted for evaluation.  SUBJECTIVE:   No issues overnight. Still short of breath but not worse. I think the whole  week, there was no significant improvement as far as cutting down his FiO2. His degree of dyspnea remains the same. On 100% FiO2 via HFNC.   VITAL SIGNS: BP 121/86   Pulse 86   Temp 98.4 F (36.9 C) (Oral)   Resp (!) 24   Ht 5\' 5"  (1.651 m)   Wt 63 kg (138 lb 14.2 oz)   SpO2 100%   BMI 23.11 kg/m   INTAKE / OUTPUT: I/O last 3 completed shifts: In: [id number] [P.O.:540; I.V.:195; IV Piggyback:200] Out: 1150 [Urine:1150]  PHYSICAL EXAMINATION: General: pleasant, speaks in full sentences w/o significant accessory muscle use.  Neuro:  Alert, normal strength, CN intact (-) lateralizing signs.  HEENT:  Pupils reactive, mild scleral icterus, no LAN Cardiovascular:  Regular, no murmur Lungs:  Even/non-labored,Bibasilar crackles. no wheeze. Good ae. Mild accessory muscle use Abdomen:  Soft, non tender, + bowel sounds Musculoskeletal:  Mild Lt > Rt LE edema Skin:  Dry skin, no rashes or lesions  LABS:  BMET  Recent Labs Lab 01/20/16 0338 01/21/16 0315 01/22/16 0348  NA 135 138 137  K 4.4 4.3 4.5  CL 104 105 103  CO2 22 24 25   BUN 29* 26* 24*  CREATININE 1.09 0.84 0.87  GLUCOSE 349* 191* 214*    Electrolytes  Recent Labs Lab 01/20/16 0338 01/21/16 0315 01/22/16 0348  CALCIUM  8.7* 8.8* 9.0    CBC  Recent Labs Lab 01/20/16 0338 01/21/16 0315 01/22/16 0348  WBC 21.0* 16.7* 24.5*  HGB 12.3* 12.6* 13.0  HCT 37.1* 37.8* 39.3  PLT 341 291 271    Coag's No results for input(s): APTT, INR in the last 168 hours.  Sepsis Markers  Recent Labs Lab 01/17/16 0343 01/18/16 1044 01/18/16 1407  LATICACIDVEN  --  2.5* 2.5*  PROCALCITON 0.16  --   --     ABG  Recent Labs Lab 01/16/16 0836  PHART 7.442  PCO2ART 37.0  PO2ART 57.3*    Liver Enzymes  Recent Labs Lab 01/18/16 0319 01/21/16 0315  AST 76* 70*  ALT 163* 165*  ALKPHOS 179* 168*  BILITOT 1.4* 1.4*  ALBUMIN 2.6* 3.1*    Cardiac Enzymes No results for input(s): TROPONINI, PROBNP in the last 168 hours.  Glucose  Recent Labs Lab 01/20/16 2310 01/21/16 0732 01/21/16 1146 01/21/16 1601 01/21/16 2138 01/22/16 0749  GLUCAP 270* 267* 237* 240* 250* 188*    Imaging Dg Chest Port 1 View  Result Date: 01/22/2016 CLINICAL DATA:  Respiratory failure. Interstitial lung disease. Acute on chronic PE. EXAM: PORTABLE CHEST 1 VIEW COMPARISON:  01/18/2016. FINDINGS: Cardiomegaly. Monitoring device traverses the esophagus, and lies in the stomach or beyond. Worsening LEFT lower lobe infiltrate, with air bronchograms, and consolidation, suspected pneumonia with small effusion.  Stable findings on the RIGHT. IMPRESSION: Worsening aeration. Electronically Signed   By: Staci Righter M.D.   On: 01/22/2016 07:29     STUDIES:  PFT 12/13/15 >> FEV1 1.90 (73%), FEV1% 85, TLC 4.28 (71%), DLCO 39% CTA Chest 02/02/2016 >> PE Rt main PA, chronic PE Rt upper/middle/lower lobes, chronic PE Lt PA, RV:LV ratio 1, basilar BTX and interstitial changes b/l, Lt lung areas of GGO ECHO 01/13/16 >> mild LVH, EF 55 to [id number], grade 1 diastolic CHF, mild increase PA pressures LE Venous Duplex 01/13/16 >> Lt common femoral DVT, Rt popliteal and posterior tibial DVT  ANTIBIOTICS:  Zosyn 8/16 > Bactrim 8/16 >   CULTURES:   Blood 8/16 > (-)  SIGNIFICANT EVENTS: 8/10  Admit with PE, started on heparin gtt 8/14  Transitioned to Eliquis, remains on HFO2 / NRB  LINES/TUBES:  DISCUSSION: 70 y/o M with PMH of ETOH abuse, recent ETOH hepatitis with d/c on steroids (8/4), cirrhosis, unprovoked PE / DVT (previously on Xarelto, negative hypercoagulable workup), GGO on CT (? If related to PE), LLL pulmonary nodule and anemia who was admitted on 8/10 with 3-4 day hx of SOB.  Work up consistent with PE in the R pulmonary artery.  Followed by Dr. Lamonte Sakai. PCCM consulted for evaluation. DNR/DNI.   ASSESSMENT / PLAN:  PULMONARY A: Acute hypoxic respiratory failure. 2/2 the following: 1. Acute on chronic pulmonary embolism. Chest CT scan with acute filling defect in the right pulmonary artery. Per report, he has bilateral chronic filling defects in both pulmonary arteries. 2. Chest CT scan on admission showed groundglass like infiltrates in the left mid to lower lung zones. Based on x-ray, those have progressed. CXR today with more consolidation in LLL. Consideration could be smoldering infection or opportunistic infection such as PCP pneumonia given him being on chronic steroids. There could also be an occult gram-negative pneumonia or aspiration pneumonia given his alcohol abuse. No history of MRSA or staph infection in the past. 3. Groundglass like infiltrates could also be inflammatory in nature. I'm not sure if he has underlying interstitial lung disease which is flaring up. CTD work up is (-). HIV (-).  4. Fluid overload was a consideration but he has been diuresced and he still remains hypoxemic. 5. I doubt he is in pulmonary edema/CHF exacerbation.  P:   Continue high flow nasal cannula (heated). He is currently on 40 L/m, 100% FiO2. Keep o2 sats > 88%.  Continue Zosyn, Bactrim for now. D4 today w/o much improvement. CXR today was worse LLL infiltrate. The only thing which I have not given him is vancomycin for possible  MRSA pneumonia. I will start him on vancomycin. Depending on how he does the next couple of days, if he improves, maybe we can discontinue Zosyn and Bactrim at that point and keep on Vanc. I'm not sure also if this is all just inflammation and infarct related to acute/chronic pulmonary embolism. Continue steroids.  I discussed with patient today that most likely it will be hard to cut down the oxygen. We will see how he does the next couple of days. He may decide to go home with high flow oxygen and have hospice involved.   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 P:  Continue Eliquis  Will need lifelong anticoagulation  Monitor hemodynamics   RENAL A:   No acute issues. P:   Monitor renal fx, urine outpt, electrolytes  GASTROINTESTINAL A:   Alcoholic hepatitis. Transaminitis. P:  Cont IV steroids LFTs reassuring.   HEMATOLOGIC A:   Leukocytosis. Likely 2/2 steroids.  Macrocytic Anemia - likely secondary to ETOH abuse P:  F/u CBC   INFECTIOUS A:   Possible PCP pneumonia, possible gram-negative pneumonia. I'm considering MRSA pneumonia although his MRSA swab was negative. P:   cont Bactrim, Zosyn. D4 today. Start vancomycin. Depending on how he does the next couple of days, need to de-escalate antibiotics. Discussed case with Pharmacy as well.  Blood culture (-) so far.   ENDOCRINE A:   No acute issues.   P:   Monitor blood sugar on BMET  NEUROLOGIC A:   No acute issues. Hx ETOH  P:   Monitor mental status Monitor for withdrawal symptoms   I extensively discussed with the patient and his youngest brother the overall condition and prognosis on 8/16. Patient and brother understand the severity of his illness. The understand that it is possible we cannot cut down the oxygen. Patient remains a DO NOT RESUSCITATE and DO NOT INTUBATE. If he does not get better by next week, most likely will go home with high flow oxygen and hospice  needs to be involved.   Monica Becton, MD 01/22/2016, 10:53 AM [facility] Pager [phone] After 3 pm or if no answer, call [phone]

## 2016-01-22 NOTE — Progress Notes (Addendum)
Pharmacy Antibiotic Note  Sean Jackson is a 70 y.o. male admitted on 01/14/2016 with respiratory failure d/t recurrent PE, however today is day 6 of anticoagulation and still with very high O2 requirement; CCM would like to r/o alternate infectious (PCP, aspiration, PsA pneumonia) and inflammatory etiologies. Pharmacy has been consulted for PO Bactrim and Zosyn dosing. Known EtOH abuse but only evidence of fatty liver dz on CT abc from 01/01/16.  LFTs acutely elevated during that admission and still above normal but trending down (3-5x ULN on 8/12)   Today, 01/22/2016: (Day 5 abx, D1 vanc)  K stable on TMP; LFTs nearing normal  Afebrile; WBC trending up (on high-dose steroids)  SCr remains wnl  Baseline PCT WNL on 8/15  Infiltrates worse on CXR; given current regimen only lacking MRSA and fungal coverage, CCM adding vancomycin per pharmacy  Plan: Vancomycin 1000 mg IV now, then 750 mg IV q12 hr; goal trough 15-20 mcg/mL  Measure vancomycin trough levels at steady state as indicated  Continue Zosyn 3.375 g IV every 8 hrs by 4-hr infusion  Continue Bactrim DS 2 tabs (320 mg TMP for 15mg /kg/day) q8 hr; until PCP r/o, recommended course is 21 days.  Continue to follow LFTs, renal function, and K+ while on Bactrim  If patient continues to worsen, only other options would be to add empiric fungal coverage or pursue more invasive diagnostic workup such as a bronchoscopy. Follow clinical course, renal function, culture results as available Follow for de-escalation of antibiotics and LOT  Height: 5\' 5"  (165.1 cm) Weight: 138 lb 14.2 oz (63 kg) IBW/kg (Calculated) : 61.5  Temp (24hrs), Avg:98.2 F (36.8 C), Min:97.6 F (36.4 C), Max:99 F (37.2 C)   Recent Labs Lab 01/17/16 0343 01/18/16 0319 01/18/16 1044 01/18/16 1407 01/19/16 0351 01/20/16 0338 01/21/16 0315 01/22/16 0348  WBC 18.2* 15.2*  --   --  16.1* 21.0* 16.7* 24.5*  CREATININE 0.57*  --   --   --  0.71 1.09 0.84  0.87  LATICACIDVEN  --   --  2.5* 2.5*  --   --   --   --     Estimated Creatinine Clearance: 67.7 mL/min (by C-G formula based on SCr of 0.87 mg/dL).    No Known Allergies  Antimicrobials this admission: Received Vanc/Zosyn x 1, then Levaquin/Flagyl 7/31 >> 8/2 during previous admission Zosyn 8/16 >>  Bactrim 8/16 >>  Vancomycin 8/20 >>   Dose adjustments this admission: ---   Microbiology results: 7/30 BCx: NGF 7/30, 8/16 MRSA PCR: neg 8/16 BCx: NGTD  Thank you for allowing pharmacy to be a part of this patient's care.  Reuel Boom, PharmD, BCPS Pager: [phone] 01/22/2016, 11:28 AM

## 2016-01-23 ENCOUNTER — Inpatient Hospital Stay (HOSPITAL_COMMUNITY): Payer: Medicare Other

## 2016-01-23 DIAGNOSIS — J810 Acute pulmonary edema: Secondary | ICD-10-CM

## 2016-01-23 LAB — GLUCOSE, CAPILLARY
Glucose-Capillary: 205 mg/dL — ABNORMAL HIGH (ref 65–99)
Glucose-Capillary: 141 mg/dL — ABNORMAL HIGH (ref 65–99)
Glucose-Capillary: 166 mg/dL — ABNORMAL HIGH (ref 65–99)

## 2016-01-23 LAB — CULTURE, BLOOD (ROUTINE X 2)
CULTURE: NO GROWTH
Culture: NO GROWTH

## 2016-01-23 LAB — BASIC METABOLIC PANEL
Anion gap: 12 (ref 5–15)
BUN: 26 mg/dL — ABNORMAL HIGH (ref 6–20)
CALCIUM: 9 mg/dL (ref 8.9–10.3)
CO2: 20 mmol/L — ABNORMAL LOW (ref 22–32)
CREATININE: 0.88 mg/dL (ref 0.61–1.24)
Chloride: 102 mmol/L (ref 101–111)
GFR calc non Af Amer: >60 mL/min (ref >60)
Glucose, Bld: 230 mg/dL — ABNORMAL HIGH (ref 65–99)
Potassium: 4.5 mmol/L (ref 3.5–5.1)
SODIUM: 134 mmol/L — AB (ref 135–145)

## 2016-01-23 LAB — CBC
HCT: 39.6 % (ref 39.0–52.0)
Hemoglobin: 13.5 g/dL (ref 13.0–17.0)
MCH: 38.6 pg — ABNORMAL HIGH (ref 26.0–34.0)
MCHC: 34.1 g/dL (ref 30.0–36.0)
MCV: 113.1 fL — ABNORMAL HIGH (ref 78.0–100.0)
PLATELETS: 232 10*3/uL (ref 150–400)
RBC: 3.5 MIL/uL — ABNORMAL LOW (ref 4.22–5.81)
RDW: 16.7 % — AB (ref 11.5–15.5)
WBC: 22.4 10*3/uL — ABNORMAL HIGH (ref 4.0–10.5)

## 2016-01-23 MED ORDER — MORPHINE SULFATE (CONCENTRATE) 10 MG/0.5ML PO SOLN
20.0000 mg | ORAL | Status: DC | PRN
Start: 2016-01-23 — End: 2016-01-24
  Administered 2016-01-23 – 2016-01-24 (×5): 20 mg via ORAL
  Filled 2016-01-23 (×5): qty 1

## 2016-01-23 MED ORDER — LORAZEPAM 2 MG/ML IJ SOLN
0.5000 mg | INTRAMUSCULAR | Status: DC | PRN
  Administered 2016-01-23: 1 mg via INTRAVENOUS
  Filled 2016-01-23 (×2): qty 1

## 2016-01-23 MED ORDER — MORPHINE BOLUS VIA INFUSION
5.0000 mg | INTRAVENOUS | Status: DC | PRN
  Filled 2016-01-23: qty 20

## 2016-01-23 MED ORDER — FLUTICASONE PROPIONATE 50 MCG/ACT NA SUSP
2.0000 | Freq: Every day | NASAL | Status: DC
  Administered 2016-01-23: 2 via NASAL
  Filled 2016-01-23: qty 16

## 2016-01-23 MED ORDER — FENTANYL BOLUS VIA INFUSION
25.0000 ug | INTRAVENOUS | Status: DC | PRN
  Filled 2016-01-23: qty 25

## 2016-01-23 MED ORDER — FENTANYL CITRATE (PF) 2500 MCG/50ML IJ SOLN
10.0000 ug/h | INTRAMUSCULAR | Status: DC
  Filled 2016-01-23: qty 50

## 2016-01-23 MED ORDER — FENTANYL BOLUS VIA INFUSION
25.0000 ug | INTRAVENOUS | Status: AC
  Administered 2016-01-23: 25 ug via INTRAVENOUS
  Filled 2016-01-23: qty 25

## 2016-01-23 MED ORDER — FUROSEMIDE 10 MG/ML IJ SOLN
40.0000 mg | Freq: Four times a day (QID) | INTRAMUSCULAR | Status: AC
  Administered 2016-01-23 (×2): 40 mg via INTRAVENOUS
  Filled 2016-01-23 (×2): qty 4

## 2016-01-23 MED ORDER — SODIUM CHLORIDE 0.9 % IV SOLN
10.0000 ug/h | INTRAVENOUS | Status: DC
  Administered 2016-01-23: 10 ug/h via INTRAVENOUS
  Filled 2016-01-23: qty 50

## 2016-01-23 MED ORDER — SODIUM CHLORIDE 0.9 % IV SOLN
25.0000 mg | Freq: Four times a day (QID) | INTRAVENOUS | Status: DC
  Administered 2016-01-23 – 2016-01-24 (×4): 25 mg via INTRAVENOUS
  Filled 2016-01-23 (×7): qty 1

## 2016-01-23 MED ORDER — DIPHENHYDRAMINE HCL 25 MG PO CAPS: 25.0000 mg | ORAL_CAPSULE | Freq: Every evening | ORAL | Status: DC | PRN

## 2016-01-23 MED ORDER — FENTANYL BOLUS VIA INFUSION
25.0000 ug | INTRAVENOUS | Status: DC | PRN
  Administered 2016-01-23 (×2): 25 ug via INTRAVENOUS
  Filled 2016-01-23: qty 25

## 2016-01-23 MED ORDER — MORPHINE SULFATE (PF) 2 MG/ML IV SOLN
1.0000 mg | INTRAVENOUS | Status: DC | PRN
  Administered 2016-01-23: 1 mg via INTRAVENOUS

## 2016-01-23 MED ORDER — MORPHINE SULFATE 25 MG/ML IV SOLN
10.0000 mg/h | INTRAVENOUS | Status: DC
  Filled 2016-01-23: qty 10

## 2016-01-23 MED ORDER — SALINE SPRAY 0.65 % NA SOLN
1.0000 | NASAL | Status: DC | PRN
  Administered 2016-01-23 (×2): 1 via NASAL
  Filled 2016-01-23: qty 44

## 2016-01-23 MED ORDER — MORPHINE SULFATE (PF) 2 MG/ML IV SOLN
INTRAVENOUS | Status: AC
  Filled 2016-01-23: qty 1

## 2016-01-23 MED ORDER — LORAZEPAM 2 MG/ML IJ SOLN
1.0000 mg | Freq: Once | INTRAMUSCULAR | Status: AC
  Administered 2016-01-23: 1 mg via INTRAVENOUS

## 2016-01-23 MED ORDER — MORPHINE SULFATE (PF) 2 MG/ML IV SOLN
2.0000 mg | INTRAVENOUS | Status: DC | PRN
  Administered 2016-01-23: 2 mg via INTRAVENOUS
  Filled 2016-01-23: qty 1

## 2016-01-23 NOTE — Consult Note (Signed)
Consultation Note Date: 01/23/2016   Patient Name: Sean Jackson  DOB: [birth year]  MRN: [id number]  Age / Sex: 70 y.o., male  PCP: Aretta Nip, MD Referring Physician: Elmarie Shiley, MD  Reason for Consultation: Establishing goals of care, Non pain symptom management, Pain control, Psychosocial/spiritual support, Terminal Care and Withdrawal of life-sustaining treatment  HPI/Patient Profile: 70 y.o. male  with past medical history of ETOH Liver Failure, Severe Pulmonary Fibrosis admitted on 01/20/2016 with respiratory failure. Continued decline and increasing O2 requirements.   Clinical Assessment and Goals of Care: Currently on High Flow O2- increasing requirements. Goals are full comfort and gradual titration of the high flow.  NEXT OF KIN- [facility]    SUMMARY OF RECOMMENDATIONS    -Start Fentanyl infusion low dose 47mcg/hr, 72mcg bolus q8minutes PRN -Continue High Flow until comfort is achieved-    Code Status/Advance Care Planning:  DNR    Symptom Management:   As above  Palliative Prophylaxis:   Aspiration, Bowel Regimen, Delirium Protocol, Frequent Pain Assessment, Oral Care and Turn Reposition  Additional Recommendations (Limitations, Scope, Preferences):  Full Comfort Care  Psycho-social/Spiritual:   Desire for further Chaplaincy support:yes  Additional Recommendations: Caregiving  Support/Resources and Grief/Bereavement Support  Prognosis:   Hours - Days  Discharge Planning: Anticipated Hospital Death      Primary Diagnoses: Present on Admission: . Pulmonary embolism ([employer]) . Alcohol abuse . AKI (acute kidney injury) ([employer])   I have reviewed the medical record, interviewed the patient and family, and examined the patient. The following aspects are pertinent.  Past Medical History:  Diagnosis Date  . Closed fracture of left wrist   . DVT   .  ETOH abuse   . Hypokalemia   . Interstitial lung disease ([place])   . Pulmonary embolism    Social History   Social History  . Marital status: Single    Spouse name: N/A  . Number of children: 0  . Years of education: N/A   Occupational History  . Retired-insurance investigator    Social History Main Topics  . Smoking status: Never Smoker  . Smokeless tobacco: Never Used  . Alcohol use 0.5 oz/week    1 Standard drinks or equivalent per week     Comment: rare  . Drug use: No  . Sexual activity: No   Other Topics Concern  . None   Social History Narrative  . None   Family History  Problem Relation Age of Onset  . Heart attack Father 70   Scheduled Meds: . antiseptic oral rinse  7 mL Mouth Rinse BID  . apixaban  5 mg Oral BID  . fluticasone  2 spray Each Nare Daily  . furosemide  40 mg Intravenous Q6H  . insulin glargine  10 Units Subcutaneous Daily  . methylPREDNISolone (SOLU-MEDROL) injection  80 mg Intravenous Q6H  . nystatin  5 mL Oral QID  . sodium chloride flush  3 mL Intravenous Q12H   Continuous Infusions: . fentaNYL infusion INTRAVENOUS  PRN Meds:.sodium chloride, acetaminophen **OR** acetaminophen, diphenhydrAMINE, fentaNYL, LORazepam, nystatin, ondansetron, sodium chloride, sodium chloride flush Medications Prior to Admission:  Prior to Admission medications   Medication Sig Start Date End Date Taking? Authorizing Provider  magnesium oxide (MAG-OX) 400 (241.3 Mg) MG tablet Take 0.5 tablets (200 mg total) by mouth daily. 01/06/16  Yes Theodis Blaze, MD  Multiple Vitamins-Minerals (MULTIVITAMIN WITH MINERALS) tablet Take 1 tablet by mouth daily.    Yes Historical Provider, MD  ondansetron (ZOFRAN) 4 MG tablet Take 1 tablet (4 mg total) by mouth every 6 (six) hours as needed for nausea. 01/06/16  Yes Theodis Blaze, MD  pantoprazole (PROTONIX) 40 MG tablet Take 1 tablet (40 mg total) by mouth 2 (two) times daily. 01/06/16  Yes Theodis Blaze, MD  potassium  chloride SA (K-DUR,KLOR-CON) 20 MEQ tablet Take 1 tablet (20 mEq total) by mouth daily. 01/06/16  Yes Theodis Blaze, MD  predniSONE (DELTASONE) 20 MG tablet Take 1 tablet (20 mg total) by mouth 2 (two) times daily with a meal. 01/06/16  Yes Theodis Blaze, MD   No Known Allergies Review of Systems  Physical Exam  Vital Signs: BP (!) 146/61 (BP Location: Right Arm)   Pulse 99   Temp 97.3 F (36.3 C) (Oral)   Resp (!) 31   Ht 5\' 5"  (1.651 m)   Wt 63 kg (138 lb 14.2 oz)   SpO2 92%   BMI 23.11 kg/m  Pain Assessment: No/denies pain POSS *See Group Information*: 1-Acceptable,Awake and alert Pain Score: 0-No pain   SpO2: SpO2: 92 % O2 Device:SpO2: 92 % O2 Flow Rate: .O2 Flow Rate (L/min): 60 L/min  IO: Intake/output summary:  Intake/Output Summary (Last 24 hours) at 01/23/16 1643 Last data filed at 01/23/16 1300  Gross per 24 hour  Intake              610 ml  Output              850 ml  Net             -240 ml    LBM: Last BM Date: 01/22/16 Baseline Weight: Weight: 63.5 kg (140 lb) Most recent weight: Weight: 63 kg (138 lb 14.2 oz)     Palliative Assessment/Data:   Flowsheet Rows   Flowsheet Row Most Recent Value  Intake Tab  Referral Department  Hospitalist  Unit at Time of Referral  ICU  Palliative Care Primary Diagnosis  Other (Comment) [cirrhosis]  Date Notified  01/23/16  Palliative Care Type  New Palliative care  Reason for referral  Clarify Goals of Care  Date of Admission  02/01/2016  # of days IP prior to Palliative referral  11  Clinical Assessment  Psychosocial & Spiritual Assessment  Palliative Care Outcomes      Time In: 4PM Time Out: 5PM Time Total: 60 min Greater than 50%  of this time was spent counseling and coordinating care related to the above assessment and plan.  Signed by: Lane Hacker, DO   Please contact Palliative Medicine Team phone at [phone] for questions and concerns.  For individual provider: See  Shea Evans

## 2016-01-23 NOTE — Progress Notes (Signed)
ANTICOAGULATION CONSULT NOTE - Follow Up  Pharmacy Consult for Apixaban Indication: pulmonary embolus  No Known Allergies  Patient Measurements: Height: 5\' 5"  (165.1 cm) Weight: 138 lb 14.2 oz (63 kg) IBW/kg (Calculated) : 61.5  Vital Signs: Temp: 97.4 F (36.3 C) (08/21 0800) Temp Source: Oral (08/21 0800) BP: 121/60 (08/21 0814) Pulse Rate: 84 (08/21 0814)  Labs:  Recent Labs  01/21/16 0315 01/22/16 0348 01/23/16 0413  HGB 12.6* 13.0 13.5  HCT 37.8* 39.3 39.6  PLT 291 271 232  CREATININE 0.84 0.87 0.88    Estimated Creatinine Clearance: 67 mL/min (by C-G formula based on SCr of 0.88 mg/dL).   Medical History: Past Medical History:  Diagnosis Date  . Closed fracture of left wrist   . DVT   . ETOH abuse   . Hypokalemia   . Interstitial lung disease ([place])   . Pulmonary embolism     Assessment: 59 yoM with PMH EtOH abuse with cirrhosis, DVT/PE, presents 01/06/2016 with SOB.  Found to have acute PE and b/l LE DVT.  Started on IV heparin and transitioned to apixaban on 8/14.  Today, 01/23/16:  Hgb stable. Plts WNL  SCr stable  No complications of therapy noted  Patient has completed the initial 7 day course of apixaban 10 mg BID.  Today, will transition to 5 mg BID dosing.  Patient education completed on 8/15.  Goal of Therapy: VTE treatment  Plan:  Continue with plan to switch to apixaban 5mg  PO BID today  Pharmacy will sign off from leaving further notes. Will continue to follow at a distance.  Hershal Coria, PharmD, BCPS Pager: [phone] 01/23/2016 10:13 AM

## 2016-01-23 NOTE — Progress Notes (Signed)
Paradoxical reaction to Ativan. Increased Agitation-severe delirium. Palliative care titration of Fentanyl infusion at bedside. Dystonic movements, confusion. Will start thorazine IV for sedation and comfort. Fentanyl infusion is at 33mcg/hour after 3 boluses given patient has achieved comfort. IV access lost after last fentanyl bolus. Difficult IV stick- SL Roxanol ordered as back up if we cannot obtain IV access. Roxanol 20mg  SL q1 PRN dyspnea. Additional options are nebulized fentanyl or morphine. For now will avoid multiple IV sticks.We can also consider subQ infusion if needed. Call palliative with questions. Dr. Hilma Favors cell [phone].  Lane Hacker, DO Palliative Medicine

## 2016-01-23 NOTE — Progress Notes (Signed)
[facility] Progress Note Patient Name: AIDRIAN CUFFE DOB: 1946-03-06 MRN: [id number]   Date of Service  01/23/2016  HPI/Events of Note  worsening distress, failing fentanyl  eICU Interventions  Start morphine, dc fentanyl     Intervention Category Major Interventions: End of life / care limitation discussion  Raylene Miyamoto. 01/23/2016, 7:54 PM

## 2016-01-23 NOTE — Progress Notes (Signed)
   01/23/16 1300  Clinical Encounter Type  Visited With Patient  Visit Type Initial;Psychological support;Spiritual support;Critical Care  Referral From Nurse  Consult/Referral To Chaplain  Spiritual Encounters  Spiritual Needs Emotional;Other (Comment) (Pastoral Conversation/Support)  Stress Factors  Patient Stress Factors Health changes;Major life changes   I visited with the patient per referral by the nurse. The patient has a palliative care consult and needed support. I gave him a prayer shawl. Follow-up is necessary.  Please, contact Spiritual Care for further assistance.   [place] M.Div.

## 2016-01-23 NOTE — Progress Notes (Signed)
Upon walking into the room patient it was noticed that the patient had become increasingly agitated. Pt stated they were trying to get out of the bed to go to the bathroom and had pulled of his oxygen, gown and ekg leads. Pt was helped with the bed pan and once again the patient tried to get out of bed and started to remove gown and other lines.when th patient's oxygen was removed by himself he was found desaturating into the 30's with his Heart rate130-140. Oxygen put back on patient and MD paged with plans to come to bedside to assess. Will continue to follow up.

## 2016-01-23 NOTE — Progress Notes (Signed)
PROGRESS NOTE    Sean Jackson  [id number] DOB: [birth year] DOA: 01/18/2016  PCP: Aretta Nip, MD   Brief Narrative:  Sean Jackson is a 70 y.o. male with medical history significant of alcohol abuse with recent admission for alcoholic hepatitis discharged on 8/4 on steroid treatment, cirrhosis, PE in the past (Xarelto in 2014) and anemia who presents for dyspnea x 3-4 days. Found to be hypoxic in the ER. CT chest shows acute PE in right main pulmonary artery . His disposition is difficult as he continues to require high levels of O2.   Subjective: Requiring more oxygen overnight.  Was place on BIPAP/    Assessment & Plan:   Acute on chronic hypoxic respiratory failure. 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- h/o left leg DVT 5 yrs prior to this- he was on Xarelto which was discontinued on 9/15 by his pulmonologist as no genetic predisposition/ hypercoagulable state was found - he admits that he had been more sendentary over the past few weeks - venous duplex of legs and b/l IVC showing b/l DVT including in left iliac vein - previously not on 02 at home -  still quite hypoxic and requiring non-re breather- needed BiPAP at one point for hypoxia - has underlying lung disease with fibrosis and bronchiectasis and persistent chronic PEs which may be contributing to the hypoxia  - pulmonary following- no indication for thrombolysis - switched to Eliquis which he is tolerating- anticoagulation needs to be life long -on IV steroids and antibiotics to cover for opportunistic infection, PNA. -observed over weekend to see if oxygen requirement improved. PT tomorrow.  Vancomycin was added 8-20. Overnight patient was more hypoxic, tachypnea, oxygen requirement 100 % 40 L to 70 L. Started on BIPAP.  Morphine PRN for increase work of breathing ordered. Stat chest x ray. CCM inform of change of  patient condition.  Palliative care consulted for goals of care and symptoms management.  Patient is negative 7 L.   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    DVT prophylaxis: Eliquis Code Status: DNR Family Communication:  Disposition Plan: to be determine. Will consult pt 8-19 Consultants:   PCCM  Procedures:  Venous Duplex LE 01/13/16 Left: DVT noted in the common femoral vein, Prominal and distal femoral . Right:  DVT noted in the Popliteal vein and the posterior tibial vein.   8/12 ECHO Normal LV systolic function; grade 1 diastolic dysfunction; trace   MR; mild TR; mildly elevated pulmonary pressure.  Antimicrobials:  Anti-infectives    Start     Dose/Rate Route Frequency Ordered Stop   01/22/16 2200  vancomycin (VANCOCIN) IVPB 750 mg/150 ml premix     750 mg 150 mL/hr over 60 Minutes Intravenous Every 12 hours  01/22/16 1116     01/22/16 1130  vancomycin (VANCOCIN) IVPB 1000 mg/200 mL premix     1,000 mg 200 mL/hr over 60 Minutes Intravenous  Once 01/22/16 1116 01/22/16 1437   01/18/16 1800  piperacillin-tazobactam (ZOSYN) IVPB 3.375 g     3.375 g 12.5 mL/hr over 240 Minutes Intravenous Every 8 hours 01/18/16 1010     01/18/16 1200  sulfamethoxazole-trimethoprim (BACTRIM DS,SEPTRA DS) 800-160 MG per tablet 2 tablet     2 tablet Oral Every  8 hours 01/18/16 1117     01/18/16 1100  piperacillin-tazobactam (ZOSYN) IVPB 3.375 g     3.375 g 100 mL/hr over 30 Minutes Intravenous  Once 01/18/16 1010 01/18/16 1053       Objective: Vitals:   01/23/16 0500 01/23/16 0700 01/23/16 0800 01/23/16 0814  BP:    121/60  Pulse:  81  84  Resp:  (!) 27  (!) 28  Temp: 98 F (36.7 C)  97.4 F (36.3 C)   TempSrc: Axillary  Oral   SpO2:  (!) 89%  93%  Weight:      Height:        Intake/Output Summary (Last 24 hours) at 01/23/16 0827 Last data filed at 01/23/16 0500  Gross per 24 hour  Intake              720 ml  Output              350 ml  Net              370 ml   Filed Weights   01/13/16 0428 01/14/16 0400 01/19/16 0418  Weight: 66.4 kg (146 lb 6.2 oz) 63.2 kg (139 lb 5.3 oz) 63 kg (138 lb 14.2 oz)    Examination: General exam: Appears comfortable  HEENT: PERRLA, oral mucosa moist, no sclera icterus or thrush Respiratory system: Clear to auscultation. Respiratory effort normal.- pulse ox 90% on High flow oxygen.  Cardiovascular system: S1 & S2 heard, RRR.  No murmurs   Gastrointestinal system: Abdomen soft, non-tender, nondistended. Normal bowel sound. No organomegaly Central nervous system: Alert and oriented. No focal neurological deficits. Extremities: No cyanosis, clubbing - no significant LE edema Skin: No rashes or ulcers Psychiatry:  Mood & affect appropriate.     Data Reviewed: I have personally reviewed following labs and imaging studies  CBC:  Recent Labs Lab 01/19/16 0351 01/20/16 0338 01/21/16 0315 01/22/16 0348 01/23/16 0413  WBC 16.1* 21.0* 16.7* 24.5* 22.4*  HGB 10.8* 12.3* 12.6* 13.0 13.5  HCT 32.2* 37.1* 37.8* 39.3 39.6  MCV 117.5* 117.4* 115.6* 115.2* 113.1*  PLT 310 341 291 271 [id number]   Basic Metabolic Panel:  Recent Labs Lab 01/19/16 0351 01/20/16 0338 01/21/16 0315 01/22/16 0348 01/23/16 0413  NA 136 135 138 137 134*  K 4.6 4.4 4.3 4.5 4.5  CL 105 104 105 103 102  CO2 24 22 24 25   20*  GLUCOSE 353* 349* 191* 214* 230*  BUN 25* 29* 26* 24* 26*  CREATININE 0.71 1.09 0.84 0.87 0.88  CALCIUM 8.2* 8.7* 8.8* 9.0 9.0   GFR: Estimated Creatinine Clearance: 67 mL/min (by C-G formula based on SCr of 0.88 mg/dL). Liver Function Tests:  Recent Labs Lab 01/18/16 0319 01/21/16 0315  AST 76* 70*  ALT 163* 165*  ALKPHOS 179* 168*  BILITOT 1.4* 1.4*  PROT 5.5* 6.4*  ALBUMIN 2.6* 3.1*   No results for input(s): LIPASE, AMYLASE in the last 168 hours. No results for input(s):  AMMONIA in the last 168 hours. Coagulation Profile: No results for input(s): INR, PROTIME in the last 168 hours. Cardiac Enzymes: No results for input(s): CKTOTAL, CKMB, CKMBINDEX, TROPONINI in the last 168 hours. BNP (last 3 results) No results for input(s): PROBNP in the last 8760 hours. HbA1C: No results for input(s): HGBA1C in the last 72 hours. CBG:  Recent Labs Lab 01/22/16 0749 01/22/16 1233 01/22/16 1637 01/22/16 2141 01/23/16 0737  GLUCAP 188* 126* 242* 166* 205*   Lipid Profile: No results for input(s): CHOL, HDL, LDLCALC, TRIG, CHOLHDL, LDLDIRECT in the last 72 hours. Thyroid Function Tests: No results for input(s): TSH, T4TOTAL, FREET4, T3FREE, THYROIDAB in the last 72 hours. Anemia Panel: No results for input(s): VITAMINB12, FOLATE, FERRITIN, TIBC, IRON, RETICCTPCT in the last 72 hours. Urine analysis:    Component Value Date/Time   COLORURINE AMBER (A) 05/18/2013 0830   APPEARANCEUR CLEAR 05/18/2013 0830   LABSPEC >1.046 (H) 05/18/2013 0830   PHURINE 5.5 05/18/2013 0830   GLUCOSEU NEGATIVE 05/18/2013 0830   HGBUR NEGATIVE 05/18/2013 0830   BILIRUBINUR NEGATIVE 05/18/2013 0830   KETONESUR NEGATIVE 05/18/2013 0830   PROTEINUR NEGATIVE 05/18/2013 0830   UROBILINOGEN 1.0 05/18/2013 0830   NITRITE NEGATIVE 05/18/2013 0830   LEUKOCYTESUR NEGATIVE 05/18/2013 0830   Sepsis Labs: @LABRCNTIP (procalcitonin:4,lacticidven:4) ) Recent Results (from the past 240 hour(s))    MRSA PCR Screening     Status: None   Collection Time: 01/18/16 10:29 AM  Result Value Ref Range Status   MRSA by PCR NEGATIVE NEGATIVE Final    Comment:        The GeneXpert MRSA Assay (FDA approved for NASAL specimens only), is one component of a comprehensive MRSA colonization surveillance program. It is not intended to diagnose MRSA infection nor to guide or monitor treatment for MRSA infections.   Culture, blood (routine x 2)     Status: None (Preliminary result)   Collection Time: 01/18/16 10:44 AM  Result Value Ref Range Status   Specimen Description BLOOD RIGHT ARM  Final   Special Requests IN PEDIATRIC BOTTLE 2CC  Final   Culture   Final    NO GROWTH 4 DAYS Performed at [facility]    Report Status PENDING  Incomplete  Culture, blood (routine x 2)     Status: None (Preliminary result)   Collection Time: 01/18/16 10:53 AM  Result Value Ref Range Status   Specimen Description BLOOD RIGHT HAND  Final   Special Requests IN PEDIATRIC BOTTLE 2CC  Final   Culture   Final    NO GROWTH 4 DAYS Performed at [facility]    Report Status PENDING  Incomplete         Radiology Studies: Dg Chest Port 1 View  Result Date: 01/22/2016 CLINICAL DATA:  Respiratory failure. Interstitial lung disease. Acute on chronic PE. EXAM: PORTABLE CHEST 1 VIEW COMPARISON:  01/18/2016. FINDINGS: Cardiomegaly. Monitoring device traverses the esophagus, and lies in the stomach or beyond. Worsening LEFT lower lobe infiltrate, with air bronchograms, and consolidation, suspected pneumonia with small effusion. Stable findings on the RIGHT. IMPRESSION: Worsening aeration. Electronically Signed   By: Staci Righter M.D.   On: 01/22/2016 07:29      Scheduled Meds: . antiseptic oral rinse  7 mL Mouth Rinse BID  . apixaban  5 mg Oral BID  . insulin aspart  0-15 Units Subcutaneous TID WC  . insulin aspart  0-5 Units Subcutaneous QHS  . insulin glargine  10 Units Subcutaneous Daily   .  magnesium oxide  200 mg Oral Daily  . methylPREDNISolone (SOLU-MEDROL) injection  80 mg Intravenous Q6H  . multivitamin with minerals  1 tablet Oral Daily  . nystatin  5 mL Oral QID  . pantoprazole  40 mg Oral BID  . piperacillin-tazobactam (ZOSYN)  IV  3.375 g Intravenous Q8H  . sodium chloride flush  3 mL Intravenous Q12H  . sulfamethoxazole-trimethoprim  2 tablet Oral Q8H  . vancomycin  750 mg Intravenous Q12H   Continuous Infusions:     LOS: 11 days    Time spent in minutes: 35 minutes.     Elmarie Shiley, MD [facility] Pager:[phone] [email or web address] Password [facility] 01/23/2016, 8:27 AM

## 2016-01-23 NOTE — Progress Notes (Signed)
Upon receiving report this morning, writer was told by previous nurse that overnight pt became more tachypnic with desaturations in the 60-70% range. Oxygen needs have increased in the last 12 hours from 100% and 40 Liters to 100% and 70 Liters with the high flow nasal cannula. Upon morning assessment patient appears more easily exhausted with minimal to no exertion and is SOB and dyspnic at rest. RT called to assess need for use of prn Bipap this morning per prn order. Per patient "I can try it to see if it helps. I think it would be a good idea. I can tell its harder to breathe than yesterday." All other VSS. Will continue to reassess.

## 2016-01-23 NOTE — Progress Notes (Signed)
PULMONARY / CRITICAL CARE MEDICINE   Name: Sean Jackson MRN: [id number] DOB: [birth year]    ADMISSION DATE:  01/25/2016 CONSULTATION DATE:  01/13/2016  REFERRING MD:  Dr. Wynelle Cleveland, [facility]  CHIEF COMPLAINT:  Short of breath  BRIEF SUMMARY: 70 y/o M with PMH of ETOH abuse, recent ETOH hepatitis with d/c on steroids (8/4), cirrhosis, unprovoked PE / DVT (previously on Xarelto, negative hypercoagulable workup), GGO on CT (? If related to PE), LLL pulmonary nodule and anemia who was admitted on 8/10 with 3-4 day hx of SOB.  Work up consistent with PE in the R pulmonary artery.  PCCM consulted for evaluation.  SUBJECTIVE:   Feeling worse Oxygenation worsening overnight On BIPAP Wants hospice  VITAL SIGNS: BP 121/60   Pulse 84   Temp 97.4 F (36.3 C) (Oral)   Resp (!) 28   Ht 5\' 5"  (1.651 m)   Wt 63 kg (138 lb 14.2 oz)   SpO2 93%   BMI 23.11 kg/m   INTAKE / OUTPUT: I/O last 3 completed shifts: In: 1010 [P.O.:60; I.V.:400; IV Piggyback:550] Out: 895 [Urine:895]  PHYSICAL EXAMINATION:  Gen : chronically ill appearing, marked respiratory distress on BIPAP HEENT: NCAT OP clear PULM: crackles left base primarily, some on R as well, no wheezing, increased work of breathing noted CV: RRR, no MGR GI: BS+, soft, nontender MSK: diminished bulk and tone Neuro: A&Ox4, maew  LABS:  BMET  Recent Labs Lab 01/21/16 0315 01/22/16 0348 01/23/16 0413  NA 138 137 134*  K 4.3 4.5 4.5  CL 105 103 102  CO2 24 25 20*  BUN 26* 24* 26*  CREATININE 0.84 0.87 0.88  GLUCOSE 191* 214* 230*    Electrolytes  Recent Labs Lab 01/21/16 0315 01/22/16 0348 01/23/16 0413  CALCIUM 8.8* 9.0 9.0    CBC  Recent Labs Lab 01/21/16 0315 01/22/16 0348 01/23/16 0413  WBC 16.7* 24.5* 22.4*  HGB 12.6* 13.0 13.5  HCT 37.8* 39.3 39.6  PLT 291 271 232    Coag's No results for input(s): APTT, INR in the last 168 hours.  Sepsis Markers  Recent Labs Lab 01/17/16 0343 01/18/16 1044  01/18/16 1407  LATICACIDVEN  --  2.5* 2.5*  PROCALCITON 0.16  --   --     ABG No results for input(s): PHART, PCO2ART, PO2ART in the last 168 hours.  Liver Enzymes  Recent Labs Lab 01/18/16 0319 01/21/16 0315  AST 76* 70*  ALT 163* 165*  ALKPHOS 179* 168*  BILITOT 1.4* 1.4*  ALBUMIN 2.6* 3.1*    Cardiac Enzymes No results for input(s): TROPONINI, PROBNP in the last 168 hours.  Glucose  Recent Labs Lab 01/21/16 2138 01/22/16 0749 01/22/16 1233 01/22/16 1637 01/22/16 2141 01/23/16 0737  GLUCAP 250* 188* 126* 242* 166* 205*    Imaging Dg Chest Port 1 View  Result Date: 01/23/2016 CLINICAL DATA:  Respiratory distress EXAM: PORTABLE CHEST 1 VIEW COMPARISON:  None. FINDINGS: Normal heart size. Left mid and lower lung zone airspace disease has improved. Minimal volume loss at the right base is stable. Low volumes. No pneumothorax. IMPRESSION: Improving airspace disease in the left mid and lower lung zones. Electronically Signed   By: Marybelle Killings M.D.   On: 01/23/2016 08:45     STUDIES:  PFT 12/13/15 >> FEV1 1.90 (73%), FEV1% 85, TLC 4.28 (71%), DLCO 39% CTA Chest 01/19/2016 >> PE Rt main PA, chronic PE Rt upper/middle/lower lobes, chronic PE Lt PA, RV:LV ratio 1, basilar BTX and interstitial changes b/l,  Lt lung areas of GGO ECHO 01/13/16 >> mild LVH, EF 55 to [id number], grade 1 diastolic CHF, mild increase PA pressures LE Venous Duplex 01/13/16 >> Lt common femoral DVT, Rt popliteal and posterior tibial DVT  ANTIBIOTICS:  Zosyn 8/16 > Bactrim 8/16 >   CULTURES:  Blood 8/16 > (-)  SIGNIFICANT EVENTS: 8/10  Admit with PE, started on heparin gtt 8/14  Transitioned to Eliquis, remains on HFO2 / NRB  LINES/TUBES:  DISCUSSION: 70 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  The cause of this problem is not certain but I suspect either lung malignancy, recurrent aspiration pneumonia, or a progressive  acute interstitial lung disease.  He would not survive a diagnostic procedure (bronchoscopy or VATS) and he has not responded to treatment for an auto-immune or infectious process.  At this point he requests hospice which is very reasonable.  ASSESSMENT / PLAN:  PULMONARY A: Acute hypoxic respiratory failure ILD PE ?aspiration? HCAP? doubt P:   Increase morphine for comfort Goal is his comfort only here, recommend palliative care consultation Will stop antibiotics Continue solumedrol for now Add lasix today  CODE STATUS: DNR  Discussed with [facility] hospitalist and the patient's brother.  Will continue to be available PRN, anticipate an in hospital death, may need morphine drip  Roselie Awkward, MD [facility] Pager: [phone] Cell: [phone] After 3pm or if no response, call [phone]

## 2016-01-24 DIAGNOSIS — I2601 Septic pulmonary embolism with acute cor pulmonale: Secondary | ICD-10-CM

## 2016-01-24 DIAGNOSIS — J8410 Pulmonary fibrosis, unspecified: Secondary | ICD-10-CM

## 2016-01-24 MED ORDER — SODIUM CHLORIDE 0.9 % IV SOLN
12.5000 mg | Freq: Four times a day (QID) | INTRAVENOUS | Status: DC
  Administered 2016-01-24 – 2016-01-25 (×3): 12.5 mg via INTRAVENOUS
  Filled 2016-01-24 (×6): qty 0.5

## 2016-01-24 MED ORDER — FENTANYL BOLUS VIA INFUSION
20.0000 ug | INTRAVENOUS | Status: DC | PRN
  Administered 2016-01-24 – 2016-01-25 (×6): 20 ug via INTRAVENOUS
  Filled 2016-01-24: qty 20

## 2016-01-24 MED ORDER — MORPHINE SULFATE (CONCENTRATE) 10 MG/0.5ML PO SOLN: 5.0000 mg | ORAL | Status: DC

## 2016-01-24 MED ORDER — MORPHINE SULFATE (CONCENTRATE) 10 MG/0.5ML PO SOLN
5.0000 mg | ORAL | Status: DC
  Administered 2016-01-24 – 2016-01-25 (×9): 5 mg via ORAL
  Filled 2016-01-24 (×9): qty 0.5

## 2016-01-24 MED ORDER — MORPHINE SULFATE (CONCENTRATE) 10 MG/0.5ML PO SOLN
20.0000 mg | ORAL | Status: DC | PRN
  Administered 2016-01-25 (×2): 20 mg via ORAL
  Filled 2016-01-24 (×3): qty 1

## 2016-01-24 NOTE — Progress Notes (Signed)
   01/24/16 0900  Clinical Encounter Type  Visited With Family;Patient and family together  Visit Type Follow-up;Psychological support;Spiritual support;Critical Care  Referral From Palliative care team  Consult/Referral To Chaplain  Spiritual Encounters  Spiritual Needs Emotional;Other (Comment);Grief support Special educational needs teacher)  Stress Factors  Patient Stress Factors Not reviewed  Family Stress Factors Loss;Other (Comment)   I followed up with the family of the patient, Sean Jackson. His brother was at the bedside when I arrived. Sean Jackson was not awake and seemed to be resting comfortably.  The patient's brother, Sean Jackson, told me about Sean Jackson and his life. This seemed to bring him comfort. The patient was active in [facility] and the patient's brother said that he has had good support from that congregation.  The patient's sister-in-law and minister arrived.  I provided the patient with a neck pillow to make him more comfortable.  The family's main concern is to make sure that the patient is comfortable. They stated that they have had time to begin to process the patient's condition. They are all working to support one another through this.    Please, contact Spiritual Care for further assistance. \  Chaplain Shanon Ace M.Div.

## 2016-01-24 NOTE — Progress Notes (Signed)
CSW continues to follow this pt to assist with d/c planning. Pt is being followed by Palliative Care Team and is now Sean Jackson. CSW is available to assist with residential hospice home placement if this is appropriate. CSW will continue to follow and assist with d/c planning as needed.  Werner Lean LCSW [phone]

## 2016-01-24 NOTE — Progress Notes (Signed)
70 yo with end stage pulmonary fibrosis and liver disease.  1. Paradoxical effects of Ativan-resolved 2. Titration of opiates  Patient looks much more comfortable today- he is awake, communicating and doing better than expected after difficulty last PM and reaction to Ativan. He seems to do well with the roxanol-will decrease fentanyl infusion and leave a fentanyl IV bolus while we decrease the high flow -ultimate plan is to move towards using all SL medications-very fragile IV access currently.  Provided support to family and discussed weaning plan with RT and RN. Goals remain full comfort.  Time: 1PM-1:35PM Total Time: 35 min Greater than 50%  of this time was spent counseling and coordinating care related to the above assessment and plan.  Lane Hacker, DO Palliative Medicine

## 2016-01-24 NOTE — Progress Notes (Signed)
PROGRESS NOTE    Sean Jackson  [id number] DOB: December 14, 1945 DOA: 01/05/2016  PCP: Aretta Nip, MD   Brief Narrative:  Sean Jackson is a 70 y.o. male with medical history significant of alcohol abuse with recent admission for alcoholic hepatitis discharged on 8/4 on steroid treatment, cirrhosis, PE in the past (Xarelto in 2014) and anemia who presents for dyspnea x 3-4 days. Found to be hypoxic in the ER. CT chest shows acute PE in right main pulmonary artery . His disposition is difficult as he continues to require high levels of O2.   Subjective: Moving arms, appears uncomfortable. Nurse will give him morphine and IV thorazine now..   Assessment & Plan:   Acute on chronic hypoxic respiratory failure. 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- h/o left leg DVT 5 yrs prior to this- he was on Xarelto which was discontinued on 9/15 by his pulmonologist as no genetic predisposition/ hypercoagulable state was found - he admits that he had been more sendentary over the past few weeks - venous duplex of legs and b/l IVC showing b/l DVT including in left iliac vein - previously not on 02 at home -  still quite hypoxic and requiring non-re breather- needed BiPAP at one point for hypoxia - has underlying lung disease with fibrosis and bronchiectasis and persistent chronic PEs which may be contributing to the hypoxia  - pulmonary following- no indication for thrombolysis - On Eliquis.  -on IV steroids and antibiotics to cover for opportunistic infection, PNA. -observed over weekend to see if oxygen requirement improved. PT tomorrow.  Vancomycin was added 8-20. Overnight patient was more hypoxic, tachypnea, oxygen requirement 100 % 40 L to 70 L. Started on BIPAP.  Morphine PRN for increase work of breathing ordered. Stat chest x ray. CCM inform of change of patient condition.  Palliative care  consulted for goals of care and symptoms management.  Patient didn't respond to aggressive care, IV solumedrol, Anticoagulation, IV lasix, IV antibiotics.  He is now full comfort care, on IV fentanyl Gtt and oral morphine. Brother think morphine helps a lot.   B/l lower extremity DVT - due to sedentary lifestyle and possible underlying coagulopathic disorder- has been worked up in the past for hypercoagulable state but testing was negative - vascular surgery consulted for need for IVF filter as another PE will be life threatining for him- they do not feel he will benefit from a filter  Interstitial lung disease - has been evaluated by pulmonary recently and is being followed-  Moderate pulmonary HTN - due to lung disease and PEs in the past  Alcohol abuse - in remission   Acute alcoholic hepatitis - was on prednisone which he was started on when last admitted  Leukocytosis  - due to steroids. On IV antibiotics.  -fluctuates.    DVT prophylaxis: Eliquis Code Status: DNR Family Communication:  Disposition Plan: to be determine. Will consult pt 8-19 Consultants:   PCCM  Procedures:  Venous Duplex LE 01/13/16 Left: DVT noted in the common femoral vein, Prominal and distal femoral . Right:  DVT noted in the Popliteal vein and the posterior tibial vein.   8/12 ECHO Normal LV systolic function; grade 1 diastolic dysfunction; trace   MR; mild TR; mildly elevated pulmonary pressure.  Antimicrobials:  Anti-infectives    Start     Dose/Rate Route Frequency Ordered Stop   01/22/16 2200  vancomycin (VANCOCIN) IVPB 750 mg/150 ml premix  Status:  Discontinued     750 mg 150 mL/hr over 60 Minutes Intravenous Every 12 hours 01/22/16 1116 01/23/16 1022   01/22/16 1130  vancomycin (VANCOCIN) IVPB 1000 mg/200 mL premix     1,000 mg 200 mL/hr over 60 Minutes Intravenous  Once 01/22/16 1116 01/22/16 1437   01/18/16 1800  piperacillin-tazobactam (ZOSYN) IVPB 3.375 g  Status:  Discontinued      3.375 g 12.5 mL/hr over 240 Minutes Intravenous Every 8 hours 01/18/16 1010 01/23/16 1022   01/18/16 1200  sulfamethoxazole-trimethoprim (BACTRIM DS,SEPTRA DS) 800-160 MG per tablet 2 tablet  Status:  Discontinued     2 tablet Oral Every 8 hours 01/18/16 1117 01/23/16 1022   01/18/16 1100  piperacillin-tazobactam (ZOSYN) IVPB 3.375 g     3.375 g 100 mL/hr over 30 Minutes Intravenous  Once 01/18/16 1010 01/18/16 1053       Objective: Vitals:   01/23/16 1800 01/23/16 1900 01/23/16 2032 01/24/16 0232  BP:      Pulse: (!) 103 (!) 111    Resp: (!) 23 (!) 30 (!) 28 (!) 26  Temp:      TempSrc:      SpO2: (!) 87% 90%    Weight:      Height:        Intake/Output Summary (Last 24 hours) at 01/24/16 0752 Last data filed at 01/24/16 0600  Gross per 24 hour  Intake           606.12 ml  Output             1500 ml  Net          -893.88 ml   Filed Weights   01/13/16 0428 01/14/16 0400 01/19/16 0418  Weight: 66.4 kg (146 lb 6.2 oz) 63.2 kg (139 lb 5.3 oz) 63 kg (138 lb 14.2 oz)    Examination: General exam: lying in bed , lethargic, moves extremities.  HEENT: PERRLA, oral mucosa moist, no sclera icterus or thrush Respiratory system: Clear to auscultation. Respiratory effort normal.- pulse ox 90% on High flow oxygen.  Cardiovascular system: S1 & S2 heard, RRR.  No murmurs   Gastrointestinal system: Abdomen soft, non-tender, nondistended. Normal bowel sound. No organomegaly Central nervous system: Alert and oriented. No focal neurological deficits. Extremities: No cyanosis, clubbing - no significant LE edema     Data Reviewed: I have personally reviewed following labs and imaging studies  CBC:  Recent Labs Lab 01/19/16 0351 01/20/16 0338 01/21/16 0315 01/22/16 0348 01/23/16 0413  WBC 16.1* 21.0* 16.7* 24.5* 22.4*  HGB 10.8* 12.3* 12.6* 13.0 13.5  HCT 32.2* 37.1* 37.8* 39.3 39.6  MCV 117.5* 117.4* 115.6* 115.2* 113.1*  PLT 310 341 291 271 [id number]   Basic Metabolic  Panel:  Recent Labs Lab 01/19/16 0351 01/20/16 0338 01/21/16 0315 01/22/16 0348 01/23/16 0413  NA 136 135 138 137 134*  K 4.6 4.4 4.3 4.5 4.5  CL 105 104 105 103 102  CO2 24 22 24 25  20*  GLUCOSE 353* 349* 191* 214* 230*  BUN 25* 29* 26* 24* 26*  CREATININE 0.71 1.09 0.84 0.87 0.88  CALCIUM 8.2* 8.7* 8.8* 9.0 9.0   GFR: Estimated Creatinine Clearance: 67 mL/min (by C-G formula based on SCr of 0.88 mg/dL). Liver Function Tests:  Recent Labs Lab 01/18/16 0319 01/21/16 0315  AST 76* 70*  ALT 163* 165*  ALKPHOS 179* 168*  BILITOT 1.4* 1.4*  PROT 5.5* 6.4*  ALBUMIN 2.6*  3.1*   No results for input(s): LIPASE, AMYLASE in the last 168 hours. No results for input(s): AMMONIA in the last 168 hours. Coagulation Profile: No results for input(s): INR, PROTIME in the last 168 hours. Cardiac Enzymes: No results for input(s): CKTOTAL, CKMB, CKMBINDEX, TROPONINI in the last 168 hours. BNP (last 3 results) No results for input(s): PROBNP in the last 8760 hours. HbA1C: No results for input(s): HGBA1C in the last 72 hours. CBG:  Recent Labs Lab 01/22/16 1637 01/22/16 2141 01/23/16 0737 01/23/16 1137 01/23/16 1546  GLUCAP 242* 166* 205* 141* 166*   Lipid Profile: No results for input(s): CHOL, HDL, LDLCALC, TRIG, CHOLHDL, LDLDIRECT in the last 72 hours. Thyroid Function Tests: No results for input(s): TSH, T4TOTAL, FREET4, T3FREE, THYROIDAB in the last 72 hours. Anemia Panel: No results for input(s): VITAMINB12, FOLATE, FERRITIN, TIBC, IRON, RETICCTPCT in the last 72 hours. Urine analysis:    Component Value Date/Time   COLORURINE AMBER (A) 05/18/2013 0830   APPEARANCEUR CLEAR 05/18/2013 0830   LABSPEC >1.046 (H) 05/18/2013 0830   PHURINE 5.5 05/18/2013 0830   GLUCOSEU NEGATIVE 05/18/2013 0830   HGBUR NEGATIVE 05/18/2013 0830   BILIRUBINUR NEGATIVE 05/18/2013 0830   KETONESUR NEGATIVE 05/18/2013 0830   PROTEINUR NEGATIVE 05/18/2013 0830   UROBILINOGEN 1.0  05/18/2013 0830   NITRITE NEGATIVE 05/18/2013 0830   LEUKOCYTESUR NEGATIVE 05/18/2013 0830   Sepsis Labs: @LABRCNTIP (procalcitonin:4,lacticidven:4) ) Recent Results (from the past 240 hour(s))  MRSA PCR Screening     Status: None   Collection Time: 01/18/16 10:29 AM  Result Value Ref Range Status   MRSA by PCR NEGATIVE NEGATIVE Final    Comment:        The GeneXpert MRSA Assay (FDA approved for NASAL specimens only), is one component of a comprehensive MRSA colonization surveillance program. It is not intended to diagnose MRSA infection nor to guide or monitor treatment for MRSA infections.   Culture, blood (routine x 2)     Status: None   Collection Time: 01/18/16 10:44 AM  Result Value Ref Range Status   Specimen Description BLOOD RIGHT ARM  Final   Special Requests IN PEDIATRIC BOTTLE [place]  Final   Culture   Final    NO GROWTH 5 DAYS Performed at [facility]    Report Status 01/23/2016 FINAL  Final  Culture, blood (routine x 2)     Status: None   Collection Time: 01/18/16 10:53 AM  Result Value Ref Range Status   Specimen Description BLOOD RIGHT HAND  Final   Special Requests IN PEDIATRIC BOTTLE [facility]  Final   Culture   Final    NO GROWTH 5 DAYS Performed at [facility]    Report Status 01/23/2016 FINAL  Final         Radiology Studies: Dg Chest Port 1 View  Result Date: 01/23/2016 CLINICAL DATA:  Respiratory distress EXAM: PORTABLE CHEST 1 VIEW COMPARISON:  None. FINDINGS: Normal heart size. Left mid and lower lung zone airspace disease has improved. Minimal volume loss at the right base is stable. Low volumes. No pneumothorax. IMPRESSION: Improving airspace disease in the left mid and lower lung zones. Electronically Signed   By: Marybelle Killings M.D.   On: 01/23/2016 08:45      Scheduled Meds: . antiseptic oral rinse  7 mL Mouth Rinse BID  . apixaban  5 mg Oral BID  . chlorproMAZINE (THORAZINE) IV  25 mg Intravenous Q6H  . fluticasone  2  spray Each Nare Daily  .  methylPREDNISolone (SOLU-MEDROL) injection  80 mg Intravenous Q6H  . nystatin  5 mL Oral QID  . sodium chloride flush  3 mL Intravenous Q12H   Continuous Infusions: . fentaNYL infusion INTRAVENOUS 25 mcg/hr (01/23/16 2100)     LOS: 12 days    Time spent in minutes: 35 minutes.     Elmarie Shiley, MD [facility] Pager:[phone] [email or web address] Password [facility] 01/24/2016, 7:52 AM

## 2016-01-25 MED ORDER — MORPHINE BOLUS VIA INFUSION
4.0000 mg | INTRAVENOUS | Status: DC | PRN
  Administered 2016-01-25: 4 mg via INTRAVENOUS
  Filled 2016-01-25: qty 4

## 2016-01-25 MED ORDER — MORPHINE SULFATE 25 MG/ML IV SOLN
4.0000 mg/h | INTRAVENOUS | Status: DC
  Administered 2016-01-25: 4 mg/h via INTRAVENOUS
  Filled 2016-01-25: qty 10

## 2016-01-25 MED ORDER — DIPHENHYDRAMINE HCL 50 MG/ML IJ SOLN
25.0000 mg | Freq: Four times a day (QID) | INTRAMUSCULAR | Status: DC | PRN
  Administered 2016-01-25: 25 mg via INTRAVENOUS
  Filled 2016-01-25: qty 1

## 2016-01-25 MED ORDER — DIPHENHYDRAMINE HCL 50 MG/ML IJ SOLN
25.0000 mg | Freq: Four times a day (QID) | INTRAMUSCULAR | Status: DC
  Administered 2016-01-25: 25 mg via INTRAVENOUS
  Filled 2016-01-25: qty 1

## 2016-01-25 MED ORDER — MAGIC MOUTHWASH
10.0000 mL | Freq: Four times a day (QID) | ORAL | Status: DC
  Administered 2016-01-25: 10 mL via ORAL
  Filled 2016-01-25 (×2): qty 10

## 2016-01-25 MED ORDER — METHYLPREDNISOLONE SODIUM SUCC 125 MG IJ SOLR: 60.0000 mg | Freq: Every day | INTRAMUSCULAR | Status: DC

## 2016-01-25 NOTE — Progress Notes (Signed)
Patient ID: Sean Jackson, male   DOB: 1945/11/18, 70 y.o.   MRN: [id number]                                                                PROGRESS NOTE                                                                                                                                                                                                             Patient Demographics:    Sean Jackson, is a 70 y.o. male, DOB - April 13, 1946, [id number]  Admit date - 01/19/2016   Admitting Physician Debbe Odea, MD  Outpatient Primary MD for the patient is Aretta Nip, MD  LOS - 13  Outpatient Specialists:  Chief Complaint  Patient presents with  . Shortness of Breath       Brief Narrative  70 y.o.malewith medical history significant of alcohol abuse with recent admission for alcoholic hepatitis discharged on 8/4 on steroid treatment, cirrhosis, PE in the past (Xarelto in 2014) and anemia who presents for dyspnea x 3-4 days. Found to be hypoxic in the ER. CT chest shows acute PE in right main pulmonary artery . His disposition is difficult as he continues to require high levels of O2.    Subjective:    Sean Jackson today has overnite been hypotensive and has overall poor prognosis.  Pt moves his arms, appeared uncomfortable until after given pain medication by RN.  Pt is unable to given history.     Assessment  & Plan :    Principal Problem:   Pulmonary embolism (HCC) Active Problems:   Alcohol abuse   AKI (acute kidney injury) ([place])   Acute alcoholic hepatitis   Acute pulmonary edema (HCC)   Hypoxia   Pulmonary embolism with acute cor pulmonale (HCC)   DVT, bilateral lower limbs (HCC)   Acute respiratory failure ([place])   HCAP (healthcare-associated pneumonia)   Postinflammatory pulmonary fibrosis (HCC)  Acute on chronic hypoxic respiratory failure. Pulmonary embolism - acute 3rd occurrence of thrombus/embolus - history >> - unprovoked saddle PE in 2014 with right leg  DVT- h/o left leg DVT 5 yrs prior to this- he was on Xarelto which was discontinued on 9/15 by his pulmonologist as no genetic predisposition/ hypercoagulable state was found - venous  duplex of legs and b/l IVC showing b/l DVT including in left iliac vein - previously not on 02 at home -  still quite hypoxic and requiring non-re breather- needed BiPAP at one point for hypoxia - has underlying lung disease with fibrosis and bronchiectasis and persistent chronic PEs which may be contributing to the hypoxia  - pulmonary following- no indication for thrombolysis Brother has agreed to [facility] following  B/l lower extremity DVT See above history  Interstitial lung disease - stable  Moderate pulmonary HTN - due to lung disease and PEs in the past  Alcohol abuse - in remission   Acute alcoholic hepatitis - was on prednisone which he was started on when last admitted, currently on solumedrol  Leukocytosis  - due to steroids. Off IV ABX     DVT prophylaxis:  Code Status: DNR Family Communication:  Disposition Plan:  [facility]:   PCCM   Lab Results  Component Value Date   PLT 232 01/23/2016    Antibiotics  :   Anti-infectives    Start     Dose/Rate Route Frequency Ordered Stop   01/22/16 2200  vancomycin (VANCOCIN) IVPB 750 mg/150 ml premix  Status:  Discontinued     750 mg 150 mL/hr over 60 Minutes Intravenous Every 12 hours 01/22/16 1116 01/23/16 1022   01/22/16 1130  vancomycin (VANCOCIN) IVPB 1000 mg/200 mL premix     1,000 mg 200 mL/hr over 60 Minutes Intravenous  Once 01/22/16 1116 01/22/16 1437   01/18/16 1800  piperacillin-tazobactam (ZOSYN) IVPB 3.375 g  Status:  Discontinued     3.375 g 12.5 mL/hr over 240 Minutes Intravenous Every 8 hours 01/18/16 1010 01/23/16 1022   01/18/16 1200  sulfamethoxazole-trimethoprim (BACTRIM DS,SEPTRA DS) 800-160 MG per tablet 2 tablet  Status:  Discontinued     2 tablet Oral Every 8 hours 01/18/16 1117  01/23/16 1022   01/18/16 1100  piperacillin-tazobactam (ZOSYN) IVPB 3.375 g     3.375 g 100 mL/hr over 30 Minutes Intravenous  Once 01/18/16 1010 01/18/16 1053        Objective:   Vitals:   01/25/16 0322 01/25/16 0323 01/25/16 0430 01/25/16 0800  BP:   (!) 86/56   Pulse: (!) 125 (!) 131 (!) 115 (!) 117  Resp: 19 13 16    Temp:      TempSrc:      SpO2: (!) 78% (!) 77% (!) 89% (!) 79%  Weight:      Height:        Wt Readings from Last 3 Encounters:  01/19/16 63 kg (138 lb 14.2 oz)  01/01/16 63.7 kg (140 lb 6.9 oz)  10/07/15 69.4 kg (153 lb)     Intake/Output Summary (Last 24 hours) at 01/25/16 1622 Last data filed at 01/25/16 1200  Gross per 24 hour  Intake              554 ml  Output              340 ml  Net              214 ml     Physical Exam , No new F.N deficits, Normal affect [place].AT,PERRAL Supple Neck,No JVD, No cervical lymphadenopathy appriciated.  Symmetrical Chest wall movement, Good air movement bilaterally, slight crackle.  No wheeze.  RRR,No Gallops,Rubs or new Murmurs, No Parasternal Heave +ve B.Sounds, Abd Soft, No tenderness, No organomegaly appriciated, No rebound - guarding or rigidity. No Cyanosis, Clubbing or edema,  No new Rash or bruise     Data Review:    CBC  Recent Labs Lab 01/19/16 0351 01/20/16 0338 01/21/16 0315 01/22/16 0348 01/23/16 0413  WBC 16.1* 21.0* 16.7* 24.5* 22.4*  HGB 10.8* 12.3* 12.6* 13.0 13.5  HCT 32.2* 37.1* 37.8* 39.3 39.6  PLT 310 341 291 271 232  MCV 117.5* 117.4* 115.6* 115.2* 113.1*  MCH 39.4* 38.9* 38.5* 38.1* 38.6*  MCHC 33.5 33.2 33.3 33.1 34.1  RDW 16.6* 16.6* 16.7* 16.9* 16.7*    Chemistries   Recent Labs Lab 01/19/16 0351 01/20/16 0338 01/21/16 0315 01/22/16 0348 01/23/16 0413  NA 136 135 138 137 134*  K 4.6 4.4 4.3 4.5 4.5  CL 105 104 105 103 102  CO2 24 22 24 25  20*  GLUCOSE 353* 349* 191* 214* 230*  BUN 25* 29* 26* 24* 26*  CREATININE 0.71 1.09 0.84 0.87 0.88  CALCIUM 8.2* 8.7*  8.8* 9.0 9.0  AST  --   --  70*  --   --   ALT  --   --  165*  --   --   ALKPHOS  --   --  168*  --   --   BILITOT  --   --  1.4*  --   --    ------------------------------------------------------------------------------------------------------------------ No results for input(s): CHOL, HDL, LDLCALC, TRIG, CHOLHDL, LDLDIRECT in the last 72 hours.  Lab Results  Component Value Date   HGBA1C 6.3 (H) 05/18/2013   ------------------------------------------------------------------------------------------------------------------ No results for input(s): TSH, T4TOTAL, T3FREE, THYROIDAB in the last 72 hours.  Invalid input(s): FREET3 ------------------------------------------------------------------------------------------------------------------ No results for input(s): VITAMINB12, FOLATE, FERRITIN, TIBC, IRON, RETICCTPCT in the last 72 hours.  Coagulation profile No results for input(s): INR, PROTIME in the last 168 hours.  No results for input(s): DDIMER in the last 72 hours.  Cardiac Enzymes No results for input(s): CKMB, TROPONINI, MYOGLOBIN in the last 168 hours.  Invalid input(s): CK ------------------------------------------------------------------------------------------------------------------    Component Value Date/Time   BNP 579.7 (H) 01/04/2016 1350    Inpatient Medications  Scheduled Meds: . antiseptic oral rinse  7 mL Mouth Rinse BID  . diphenhydrAMINE  25 mg Intravenous Q6H  . magic mouthwash  10 mL Oral QID  . [START ON 02/11/2016] methylPREDNISolone (SOLU-MEDROL) injection  60 mg Intravenous Daily  . sodium chloride flush  3 mL Intravenous Q12H   Continuous Infusions: . morphine 4 mg/hr (01/25/16 1316)   PRN Meds:.sodium chloride, acetaminophen **OR** acetaminophen, morphine, nystatin, ondansetron, sodium chloride, sodium chloride flush  Micro Results Recent Results (from the past 240 hour(s))  MRSA PCR Screening     Status: None   Collection Time:  01/18/16 10:29 AM  Result Value Ref Range Status   MRSA by PCR NEGATIVE NEGATIVE Final    Comment:        The GeneXpert MRSA Assay (FDA approved for NASAL specimens only), is one component of a comprehensive MRSA colonization surveillance program. It is not intended to diagnose MRSA infection nor to guide or monitor treatment for MRSA infections.   Culture, blood (routine x 2)     Status: None   Collection Time: 01/18/16 10:44 AM  Result Value Ref Range Status   Specimen Description BLOOD RIGHT ARM  Final   Special Requests IN PEDIATRIC BOTTLE [place]  Final   Culture   Final    NO GROWTH 5 DAYS Performed at [facility]    Report Status 01/23/2016 FINAL  Final  Culture, blood (routine x 2)  Status: None   Collection Time: 01/18/16 10:53 AM  Result Value Ref Range Status   Specimen Description BLOOD RIGHT HAND  Final   Special Requests IN PEDIATRIC BOTTLE [facility]  Final   Culture   Final    NO GROWTH 5 DAYS Performed at [facility]    Report Status 01/23/2016 FINAL  Final    Radiology Reports Ct Abdomen Pelvis Wo Contrast  Result Date: 01/01/2016 CLINICAL DATA:  Abdominal pain and vomiting. EXAM: CT ABDOMEN AND PELVIS WITHOUT CONTRAST TECHNIQUE: Multidetector CT imaging of the abdomen and pelvis was performed following the standard protocol without IV contrast. COMPARISON:  None. FINDINGS: Lower chest: Diffuse peripheral interstitial coarsening bronchiectasis is noted at the lung bases bilaterally. No focal airspace consolidation is present. The heart is mildly enlarged. Coronary artery calcifications are present. Hepatobiliary: Extensive fatty infiltration of the liver is noted. No discrete mass lesion is present. The liver contours are smooth. The common bile duct is within normal limits. Gallbladder is unremarkable. Pancreas: No significant inflammatory changes are present. No focal cystic or solid mass lesion is present. There is no ductal dilation. Spleen: Normal  size in appearance Adrenals/Urinary Tract: The adrenal glands are normal bilaterally. The kidneys and ureters are unremarkable. No mass lesion or stone is present. There is mild perinephric stranding bilaterally. The urinary bladder is within normal limits. Stomach/Bowel: The stomach is mildly dilated with a fluid level. No obstructing lesion is present. The duodenum is within normal limits. The small bowel is unremarkable. Contrast extends to the lower mid abdomen without a discrete transition point. The more distal bowel and terminal ileum are within normal limits. The appendix is visualized and normal. The cecum is within normal limits. Diverticular changes are present within the ascending colon without focal inflammation. Diverticula are also present in the descending and sigmoid colon without focal inflammation. Vascular/Lymphatic: Atherosclerotic calcifications are present within the aorta and branch vessels without aneurysm. No significant adenopathy is present. Reproductive: Unremarkable Other: No free fluid or free air is present. Musculoskeletal: Grade 1 degenerative anterolisthesis is present at L4-5. There slight retrolisthesis at L2-3. No focal lytic or blastic lesions are present. Vertebral body heights are maintained. Advanced facet hypertrophy is present at L4-5 and L5-S1. The bony pelvis is intact. IMPRESSION: 1. Mild dilation of the stomach without an obstructing lesion. This is nonspecific and may be within normal limits. 2. Colonic diverticular changes within the ascending colon, descending colon and proximal sigmoid colon without focal inflammation to suggest diverticulitis. 3. Diffuse hepatic steatosis. No discrete lesions or evidence for cirrhosis are present. 4. Grade 1 degenerative anterolisthesis at L4-5 secondary to advanced facet hypertrophy. Electronically Signed   By: San Morelle M.D.   On: 01/01/2016 12:50  Ct Angio Chest Pe W And/or Wo Contrast  Result Date:  01/25/2016 CLINICAL DATA:  Shortness of breath. Hypoxia. Previous pulmonary emboli. EXAM: CT ANGIOGRAPHY CHEST WITH CONTRAST TECHNIQUE: Multidetector CT imaging of the chest was performed using the standard protocol during bolus administration of intravenous contrast. Multiplanar CT image reconstructions and MIPs were obtained to evaluate the vascular anatomy. CONTRAST:  70 cc Isovue COMPARISON:  Chest x-ray dated 01/22/2016 and chest CT dated 08/03/2015 and CT angiogram dated 05/18/2013 FINDINGS: Mediastinum/Lymph Nodes: There is a prominent pulmonary embolus in the right main pulmonary artery measuring approximately 2.4 cm in diameter. There are chronic pulmonary emboli extending into the right middle, right upper, and right lower lobes with small residual bands in some of the vessels. There are also chronic  bandlike pulmonary emboli in the left pulmonary arteries. RV/LV ratio is 1.0. Overall heart size is normal. No adenopathy. Lungs/Pleura: Chronic obstructive lung disease with chronic peripheral interstitial disease at the lung bases with chronic bronchiectasis at both bases. New hazy infiltrate throughout the left lower lobe and less extensively in the left upper lobe. There is increased perfusion to the left lung as compared to the right and compared to the prior study of 08/03/2015. No effusions. Upper abdomen: Normal. Musculoskeletal: No chest wall mass or suspicious bone lesions identified. Review of the MIP images confirms the above findings. IMPRESSION: 1. Chronic bilateral pulmonary emboli. 2. 2.4 cm embolus in the right main pulmonary artery which I suspect represents a recurrent pulmonary embolus. There is new shunting of blood to the left lung with hazy infiltrate in the left lung which I suspect represents pulmonary edema. 3. Bronchiectasis at the lung bases. 4. These abnormalities less likely could represent usual interstitial pneumonitis. I recommend a follow-up chest CT without contrast in 6  months for further evaluation. Critical Value/emergent results were called by telephone at the time of interpretation on 01/13/2016 at 3:24 pm to Dr. Addison Lank , who verbally acknowledged these results. . Electronically Signed   By: Lorriane Shire M.D.   On: 01/19/2016 15:24   [place] Art/ven Flow Abd Pelv Doppler  Result Date: 01/02/2016 CLINICAL DATA:  Elevated LFTs. Fatty liver suggested on earlier ultrasound. EXAM: DUPLEX ULTRASOUND OF LIVER TECHNIQUE: Color and duplex Doppler ultrasound was performed to evaluate the hepatic in-flow and out-flow vessels. COMPARISON:  CT from previous day FINDINGS: Portal Vein: No occlusion or thrombus. Hepatopetal flow. Velocities Main:  20-29 cm/sec Right:  28 cm/sec Left:  23 cm/sec Hepatic Vein Velocities Right:  Hepatofugal, 19 cm/sec Middle:  Hepatofugal, 16 cm/sec Left:  Not visualized secondary to overlying structures. Hepatic Artery Velocity:  58 cm/sec Spleen 5.6 x 7.3 x 3.6 cm (volume = 76.5 cc). Splenic Vein, no occlusion or thrombus. Velocity: 25 cm/sec Varices: None identified Ascites: Absent IMPRESSION: 1. Unremarkable hepatic Doppler evaluation. Left hepatic vein was obscured by overlying structures. Electronically Signed   By: Lucrezia Europe M.D.   On: 01/02/2016 10:09  Dg Chest Port 1 View  Result Date: 01/23/2016 CLINICAL DATA:  Respiratory distress EXAM: PORTABLE CHEST 1 VIEW COMPARISON:  None. FINDINGS: Normal heart size. Left mid and lower lung zone airspace disease has improved. Minimal volume loss at the right base is stable. Low volumes. No pneumothorax. IMPRESSION: Improving airspace disease in the left mid and lower lung zones. Electronically Signed   By: Marybelle Killings M.D.   On: 01/23/2016 08:45   Dg Chest Port 1 View  Result Date: 01/22/2016 CLINICAL DATA:  Respiratory failure. Interstitial lung disease. Acute on chronic PE. EXAM: PORTABLE CHEST 1 VIEW COMPARISON:  01/18/2016. FINDINGS: Cardiomegaly. Monitoring device traverses the esophagus, and  lies in the stomach or beyond. Worsening LEFT lower lobe infiltrate, with air bronchograms, and consolidation, suspected pneumonia with small effusion. Stable findings on the RIGHT. IMPRESSION: Worsening aeration. Electronically Signed   By: Staci Righter M.D.   On: 01/22/2016 07:29   Dg Chest Port 1 View  Result Date: 01/18/2016 CLINICAL DATA:  Respiratory failure. EXAM: PORTABLE CHEST 1 VIEW COMPARISON:  01/17/2016.  01/17/2016.  CT 18 2017 FINDINGS: Mediastinum hilar structures normal. Diffuse left lung infiltrate again change. Low lung volumes. No pleural effusion. Linear density noted over both upper lungs, possibly skin folds and/or bullous changes. Attention to these on follow-up chest x-ray suggested. IMPRESSION:  1.  Diffuse left lung infiltrate.  No interim change. 2. Linear densities noted over the upper lungs, possibly skin fold and/or bullous change. No definite pneumothorax noted. Attention to these regions on subsequent follow-up chest x-ray suggested. Electronically Signed   By: Marcello Moores  Register   On: 01/18/2016 07:00   Dg Chest Port 1 View  Result Date: 01/17/2016 CLINICAL DATA:  History of known PE and left lower lobe infiltrate. EXAM: PORTABLE CHEST 1 VIEW COMPARISON:  01/14/2016 FINDINGS: Cardiac shadow is stable. Persistent left basilar infiltrate is noted. The right lung remains clear. No sizable effusion is noted. IMPRESSION: Stable left basilar infiltrate. Electronically Signed   By: Inez Catalina M.D.   On: 01/17/2016 10:47   Dg Chest Port 1 View  Result Date: 01/14/2016 CLINICAL DATA:  Respiratory failure.  Pulmonary embolism. EXAM: PORTABLE CHEST 1 VIEW COMPARISON:  01/29/2016 FINDINGS: Progression of left lower lobe airspace disease. Progression of small left pleural effusion. Right lung remains clear.  Negative for edema. IMPRESSION: Progression of left lower lobe atelectasis/ infiltrate and left pleural effusion. Electronically Signed   By: Franchot Gallo M.D.   On: 01/14/2016  07:17   Dg Chest Port 1 View  Result Date: 01/13/2016 CLINICAL DATA:  Left leg swelling, shortness of Breath EXAM: PORTABLE CHEST 1 VIEW COMPARISON:  01/01/2016 FINDINGS: Cardiomediastinal silhouette is stable. There is streaky left basilar atelectasis or infiltrate. No pulmonary edema. IMPRESSION: No pulmonary edema. Streaky left basilar atelectasis or early infiltrate. Electronically Signed   By: Lahoma Crocker M.D.   On: 01/09/2016 12:59   Dg Chest Port 1 View  Result Date: 01/01/2016 CLINICAL DATA:  Sepsis EXAM: PORTABLE CHEST 1 VIEW COMPARISON:  CT 08/03/2015 FINDINGS: Heart is normal size. Left lower lobe atelectasis or infiltrate. No visible effusion. Right lung is clear. No acute bony abnormality. IMPRESSION: Left lower lobe atelectasis or infiltrate. Electronically Signed   By: Rolm Baptise M.D.   On: 01/01/2016 12:30  US Abdomen Limited Ruq  Result Date: 01/02/2016 CLINICAL DATA:  70 year old male with hyperbilirubinemia. Subsequent encounter. EXAM: US ABDOMEN LIMITED - RIGHT UPPER QUADRANT COMPARISON:  01/01/2016 CT. FINDINGS: Gallbladder: No gallstones. Gallbladder wall thickness normal 2 top-normal. No pericholecystic fluid. No sonographic Murphy sign noted by sonographer. Common bile duct: Diameter: 2.3 mm. Liver: Diffuse increased echogenicity consistent with fatty infiltration without focal mass identified. Left lobe of the liver is poorly delineated secondary to overlying bowel gas. IMPRESSION: No gallstones. Gallbladder wall thickness normal to top normal. No pericholecystic fluid. No tenderness over the gallbladder during scanning. Diffuse fatty infiltration of the liver. Evaluation of the left lobe of the liver is limited by bowel gas. Proximal common bile duct is not dilated. Mid to distal aspect not visualized secondary to bowel gas. Electronically Signed   By: Genia Del M.D.   On: 01/02/2016 07:29   Time Spent in minutes  30   Jani Gravel M.D on 01/25/2016 at 4:22 PM  Between  7am to 7pm - Pager - [phone]  After 7pm go to [email or web address] - password [email or web address]  [facility] -  Office  [phone]

## 2016-01-25 NOTE — Progress Notes (Signed)
Worsening Akathesias, moaning, but no grimace-face is relaxed, having vocalizations with respirations due to sedation. Gave 68mcg bolus of fentanyl while in the room- turned patient on his side. Will give dose of benedryl and see if that helps with the dystonic movements- avoid benzos due to paradoxical reaction. His condition and slow decline is very hard on his family. They have been at the bedside ATC-they are exhausted. He is off high flow O2 - may not be stable enough to be moved off the unit. Anticipate death hours-less likely days. -Transitioned him to morphine infusion and bolus, will stop sublingual -Glycopyrolate for secretions -Benedryl for [employer] -reduced steroid dose to daily- may also be causing movement disorder and psychosis at high doses.  Time: 1230-105 Total: 35 minuites Greater than 50%  of this time was spent counseling and coordinating care related to the above assessment and plan.  Lane Hacker, DO Palliative Medicine

## 2016-01-25 NOTE — Progress Notes (Signed)
   01/25/16 1000  Clinical Encounter Type  Visited With Family  Visit Type Follow-up;Psychological support;Spiritual support;Patient actively dying  Consult/Referral To Chaplain  Spiritual Encounters  Spiritual Needs Emotional;Other (Comment);Grief support Special educational needs teacher )  Stress Factors  Patient Stress Factors Not reviewed  Family Stress Factors Loss;Other (Comment)    I visited with the brother of the patient again today. He was visibly more exhausted today than he has been during the previous visits. He stated that he was doing ok today, and that his only concern is that the patient is comfortable.   Please, contact Spiritual Care for further assistance.   [place] M.Div.

## 2016-01-31 ENCOUNTER — Inpatient Hospital Stay: Payer: Medicare Other | Admitting: Emergency Medicine

## 2016-02-03 DEATH — deceased

## 2016-03-04 IMAGING — CT CT CHEST W/O CM
2 of 3 series · 15 of 36 positions shown, 18 images · non-contrast
Comparison: Chest CT August 24, 2014

CLINICAL DATA: Pulmonary nodules

EXAM:
CT CHEST WITHOUT CONTRAST
TECHNIQUE: Multidetector CT imaging of the chest was performed following the
standard protocol without IV contrast.

[Series 2: thorax · axial · 0.72mm/px · z∈[-263,-23]mm · 12 of 58 slices shown, 15 images]
[im 5/58  mediastinal]
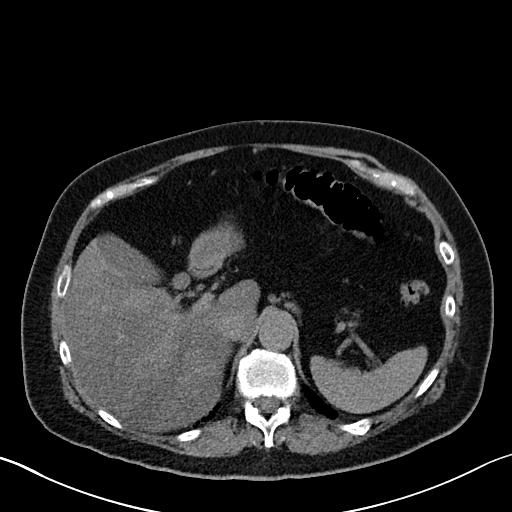
[im 5/58  lung]
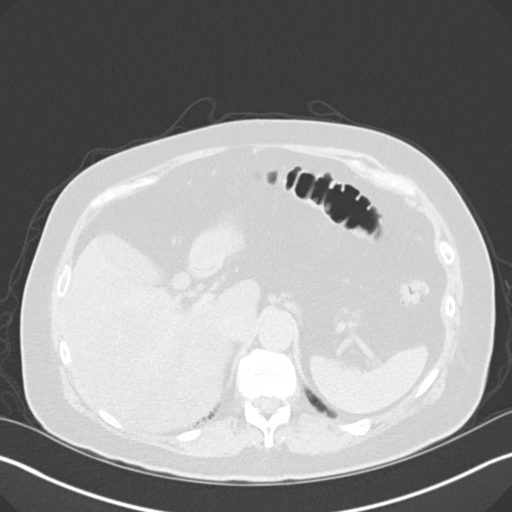
[im 9/58  lung]
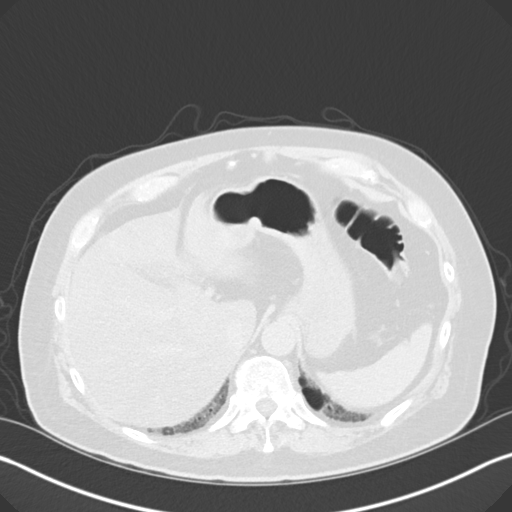
[im 13/58  lung]
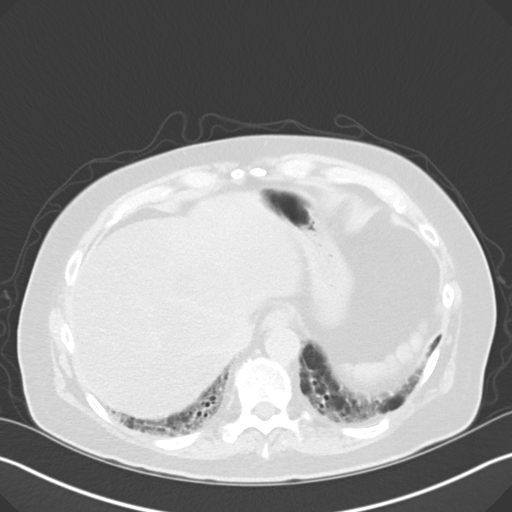
[im 17/58  lung]
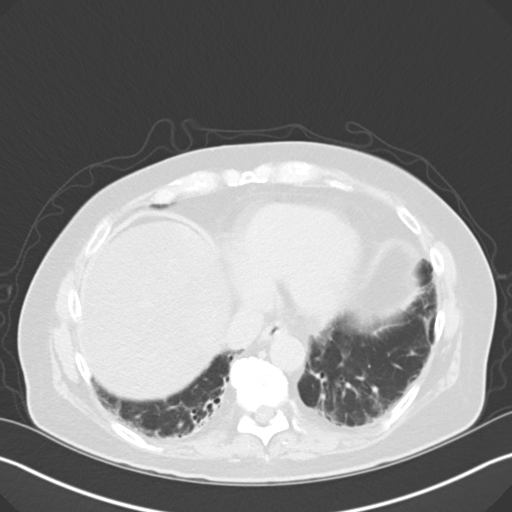
[im 22/58  mediastinal]
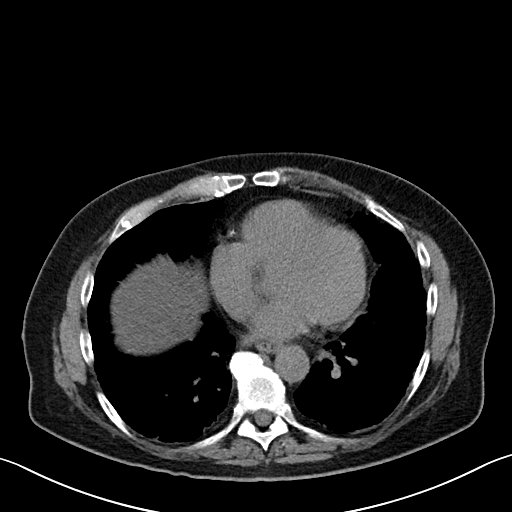
[im 22/58  lung]
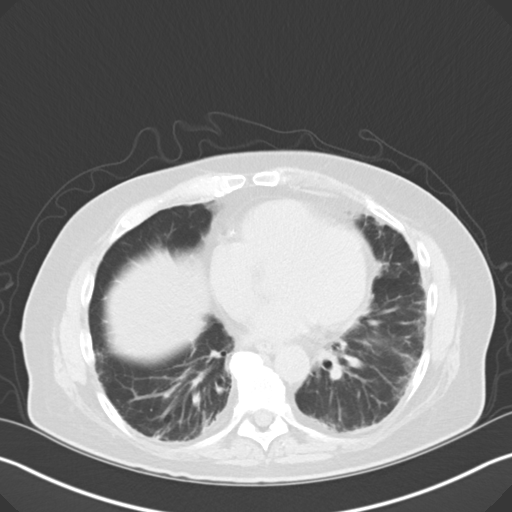
[im 26/58  lung]
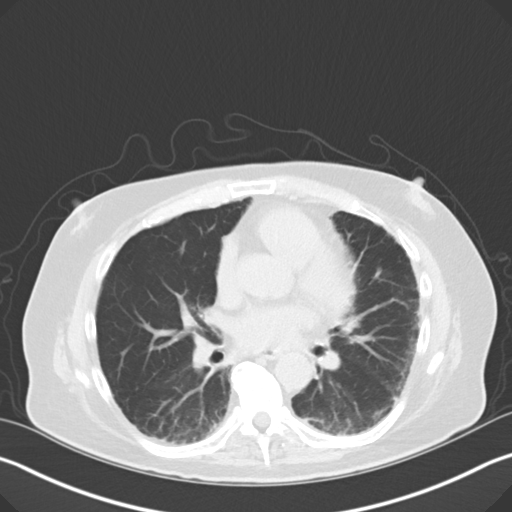
[im 32/58  lung]
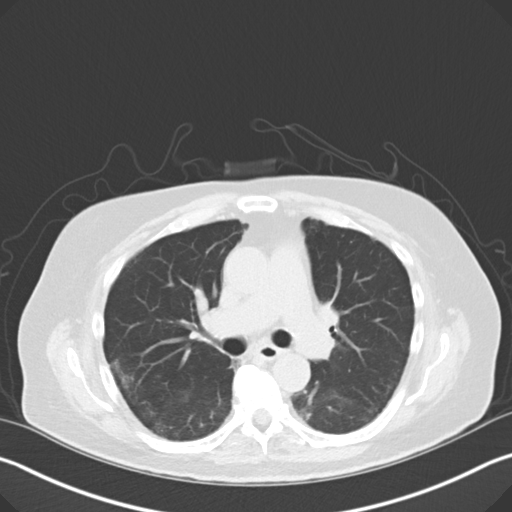
[im 36/58  lung]
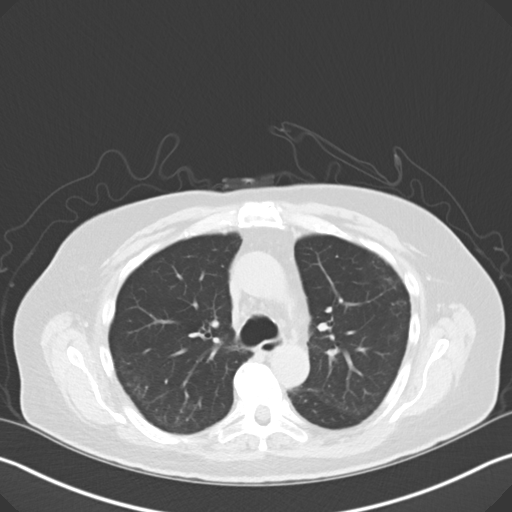
[im 41/58  mediastinal]
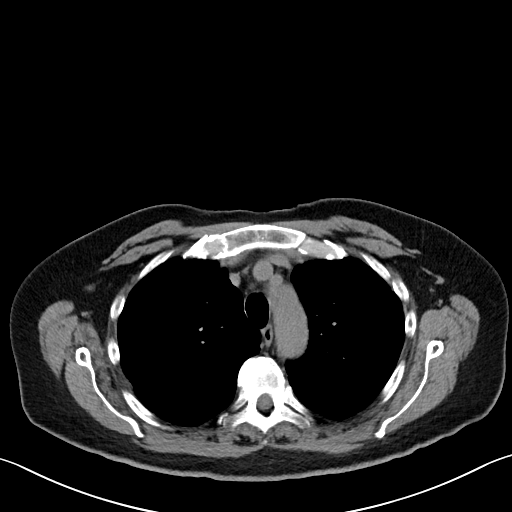
[im 41/58  lung]
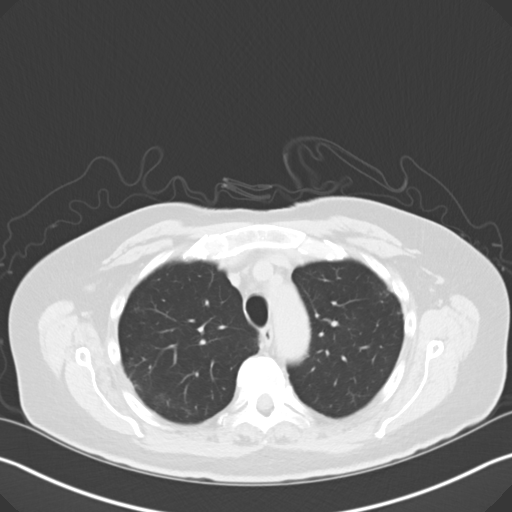
[im 45/58  lung]
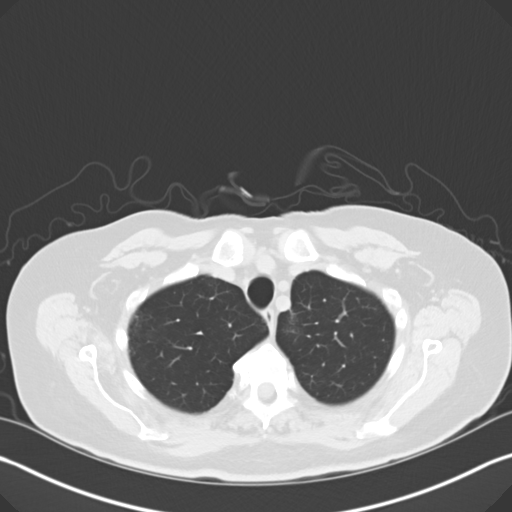
[im 49/58  lung]
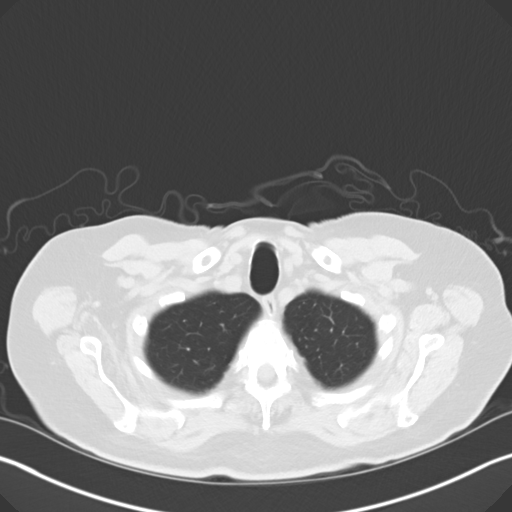
[im 53/58  lung]
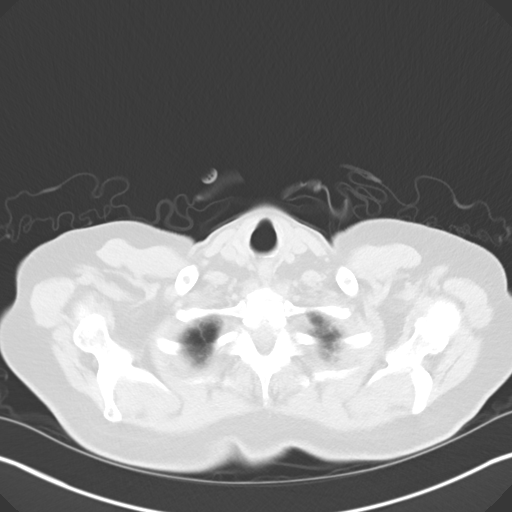

[Series 5: coronal · coronal · 0.59mm/px · 3 of 117 slices shown]
[im 24/117  lung]
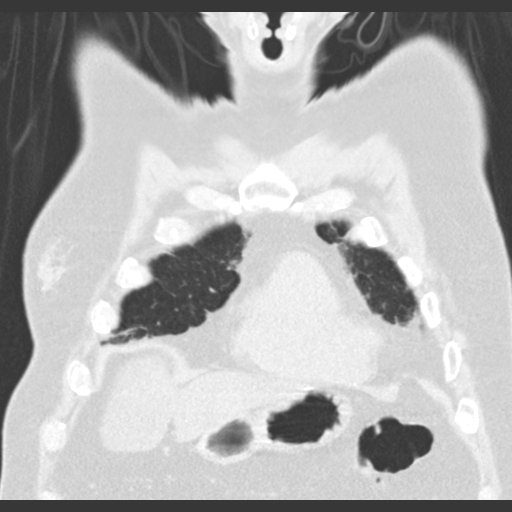
[im 47/117  lung]
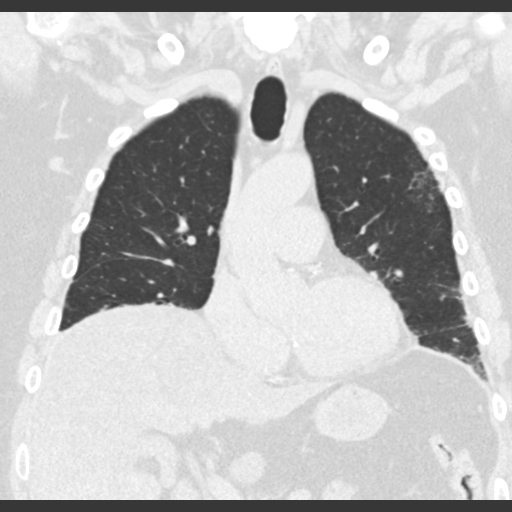
[im 70/117  lung]
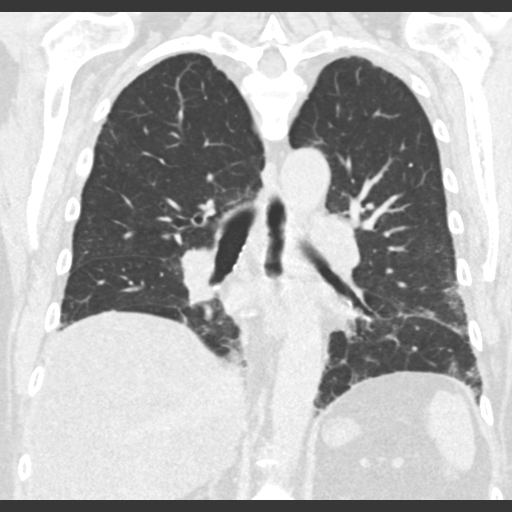

[15 of 36 positions shown; findings below may reference images not displayed]

FINDINGS: Mediastinum/Lymph Nodes: Thyroid appears unremarkable. There is no
appreciable thoracic adenopathy. Pericardium is not thickened. There
are multiple foci of coronary artery calcification. There is no
appreciable thoracic aortic aneurysm. The visualized great vessels
appear unremarkable on this noncontrast enhanced study.

Lungs/Pleura: Mild ground-glass type opacity just superior to the
aortic arch on the left is unchanged. There are scattered areas of
subpleural reticulation with tiny nodular opacities, stable. The
previously noted 11 mm opacity in the superior segment left lower
lobe is not appreciated at this time. There is mild scarring in this
area currently. The previously noted 5 mm nodular opacity in the
inferior lingula is no longer present as a discrete lesion.
Currently, it note nodular appearing opacities are apparent beyond
tiny nodules associated with reticulonodular interstitial disease in
the periphery of the lungs bilaterally, stable. There is bibasilar
traction bronchiectatic type change with scattered areas of
fibrosis, primarily in the bases, stable. No new opacity is evident.

Upper abdomen: In the visualized upper abdomen, there is hepatic
steatosis. Visualized upper abdominal structures otherwise appear
unremarkable.

Musculoskeletal: There is degenerative change in the thoracic spine.
There are no blastic or lytic bone lesions.
IMPRESSION: Areas of bibasilar traction bronchiectasis with interstitial
fibrosis. Patchy areas of reticulonodular interstitial disease
remains stable. There is no new opacity. No well-defined pulmonary
nodular lesions are identified beyond tiny nodular lesions
associated with reticulonodular interstitial disease. As stated
previously, the appearance favors a nonspecific interstitial
pneumonitis with fibrosis.

No frank edema or consolidation.

No adenopathy.

Evidence of hepatic steatosis.

There are multiple foci of coronary artery calcification evident.

## 2016-03-04 NOTE — Discharge Summary (Signed)
Sean Jackson, is a 70 y.o. male  DOB 1946/05/01  MRN [id number].  Admission date:  01/14/2016  Admitting Physician  Debbe Odea, MD  Expired on 01-Feb-2016  Primary MD  Aretta Nip, MD  Cause of Death:  PE, recurrent Pulmonary Fibrosis    Admission Diagnosis  SOB (shortness of breath) [R06.02]   Discharge Diagnosis  SOB (shortness of breath) [R06.02]    Principal Problem:   Pulmonary embolism (HCC) Active Problems:   Alcohol abuse   AKI (acute kidney injury) ([place])   Acute alcoholic hepatitis   Acute pulmonary edema (HCC)   Hypoxia   Pulmonary embolism with acute cor pulmonale (HCC)   DVT, bilateral lower limbs (HCC)   Acute respiratory failure ([place])   HCAP (healthcare-associated pneumonia)   Postinflammatory pulmonary fibrosis ([place])      Past Medical History:  Diagnosis Date  . Closed fracture of left wrist   . DVT   . ETOH abuse   . Hypokalemia   . Interstitial lung disease ([place])   . Pulmonary embolism     Past Surgical History:  Procedure Laterality Date  . ANKLE FRACTURE SURGERY    . LEFT HAND SURGERY    . RIGHT HEART CATHETERIZATION N/A 05/19/2013   Procedure: RIGHT HEART CATH;  Surgeon: Jolaine Artist, MD;  Location: [facility] CATH LAB;  Service: Cardiovascular;  Laterality: N/A;       HPI  from the history and physical done on the day of admission:     70 y.o. male with medical history significant of alcohol abuse with recent admission for alcoholic hepatitis on steroid treatment, chronic thrombocytopenia, cirrhosis, PE in the past (Xarelto in 2014) and anemia who presents for dyspnea x 3-4 days. Found to be hypoxic in the ER.    ED Course: CT chest shows acute PE in right main pulmonary artery . On non-rebreather, hemodynamically stable- started on Heparin infusion    Hospital Course:      pt was noted to have PE,  Acute in the right main pulmonary  artery. Pt was started on heparin iv.  Cardiac echo 8/11/2-17=> EF 55%,  Mild pulmonary htn.   vascular surgery didn't think IVC filter would be beneficial.  Pt was transitioned to eliquis.  Started on iv abx, iv solumedrol due to continued dyspnea.  He continued to deteriorate on 01/23/2016 requiring bipap .  Pt didn't respond to aggressive care and was transitioned to FULL COMFORT CARE, w iv fentanyl and morphine.  Pt expired on 02/02/16     Consults obtained - pulmonary, vascular surgery  Discharge Condition: pt passed on 02-01-2016      Subjective    Sean Jackson today has expired 02/02/16     Objective   Blood pressure (!) 51/23, pulse (!) 109, temperature 97.1 F (36.2 C), temperature source Axillary, resp. rate 18, height 5\' 5"  (1.651 m), weight 63 kg (138 lb 14.2 oz), SpO2 (!) 80 %.  No intake or output data in the 24 hours ending 02/16/16 1033  Exam Unresponsive Pupils fixed No pulse, no breath sounds, no response to pain    Data Review   CBC w Diff:  Lab Results  Component Value Date   WBC 22.4 (H) 01/23/2016   HGB 13.5 01/23/2016   HCT 39.6 01/23/2016   HCT 33.0 (L) 01/03/2016   PLT 232 01/23/2016   LYMPHOPCT 7 01/10/2016   MONOPCT 5 02/01/2016   EOSPCT 0 01/31/2016   BASOPCT 0 01/21/2016    CMP:  Lab Results  Component Value Date   NA 134 (L) 01/23/2016   K 4.5 01/23/2016   CL 102 01/23/2016   CO2 20 (L) 01/23/2016   BUN 26 (H) 01/23/2016   CREATININE 0.88 01/23/2016   PROT 6.4 (L) 01/21/2016   ALBUMIN 3.1 (L) 01/21/2016   BILITOT 1.4 (H) 01/21/2016   ALKPHOS 168 (H) 01/21/2016   AST 70 (H) 01/21/2016   ALT 165 (H) 01/21/2016  .   Total Time in preparing paper work, data evaluation and todays exam - 67 minutes  Jani Gravel M.D on 02/16/2016 at 10:33 AM  [facility]   Office  [phone]

## 2016-08-23 IMAGING — DX DG CHEST 1V PORT
1 series · 1 of 1 positions shown · non-contrast
Comparison: 01/18/2016.

CLINICAL DATA: Respiratory failure. Interstitial lung disease.
Acute on chronic PE.

EXAM:
PORTABLE CHEST 1 VIEW

[chest ap]
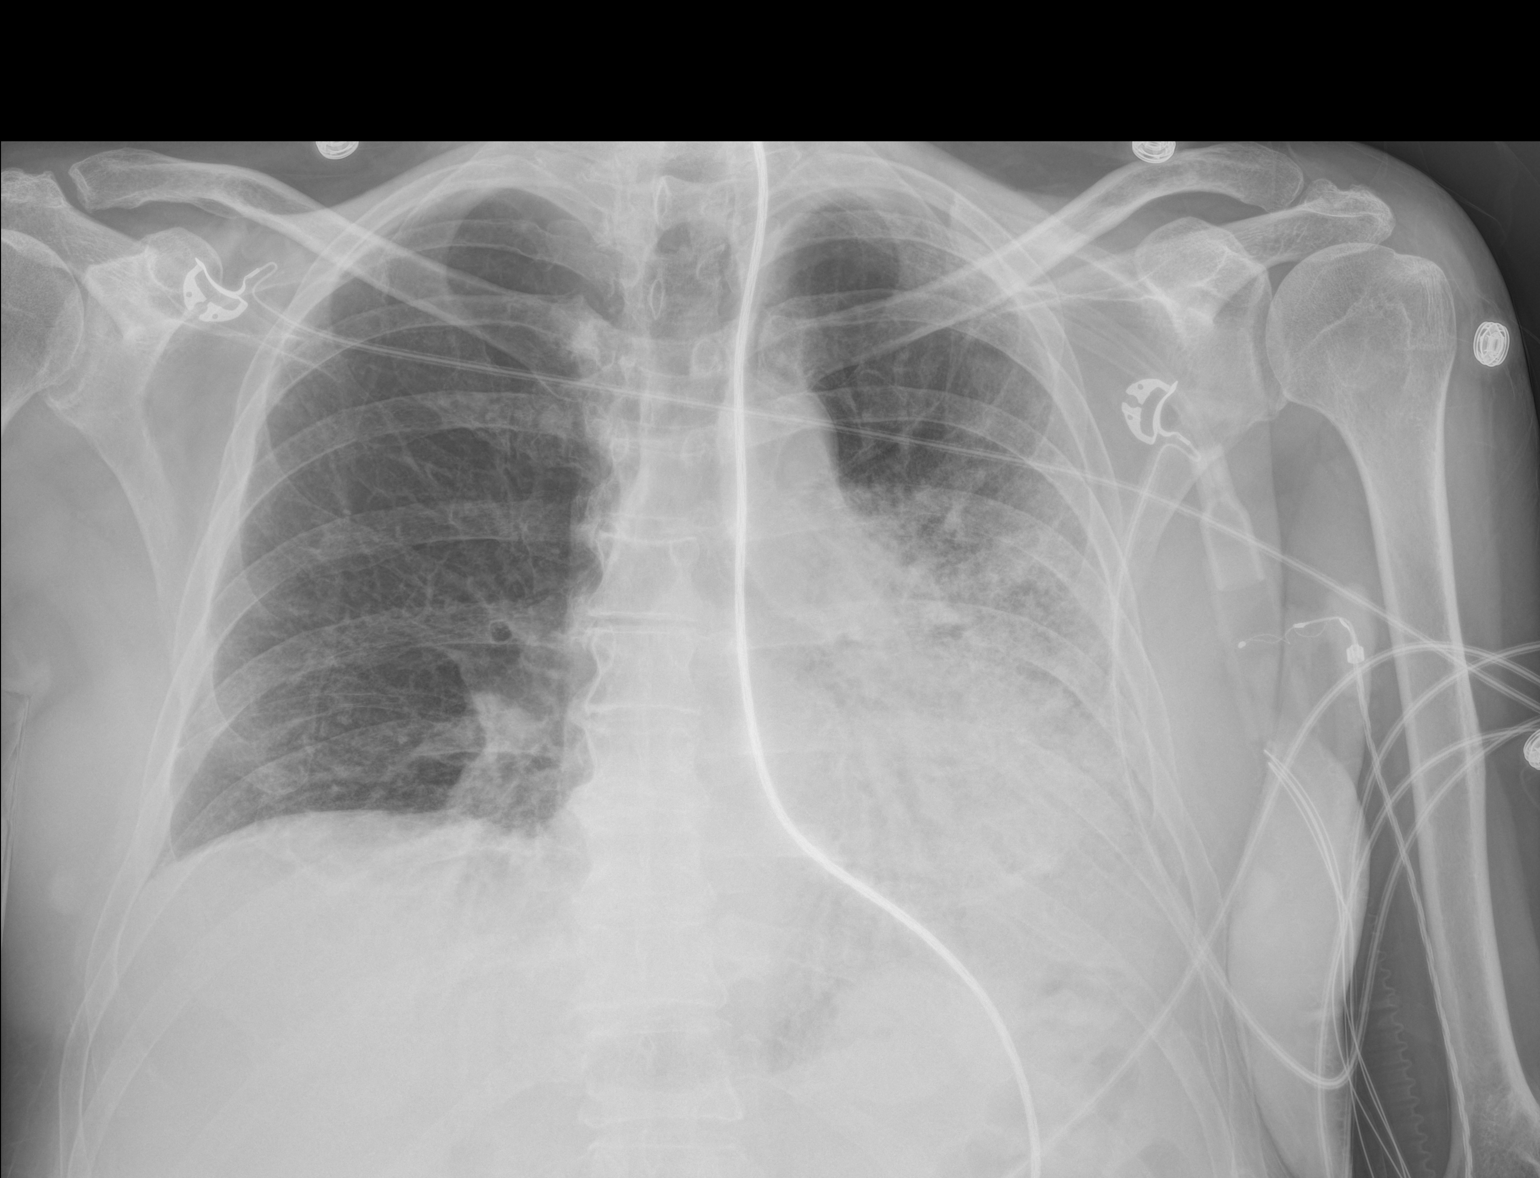

[1 of 1 positions shown; findings below may reference images not displayed]

FINDINGS: Cardiomegaly. Monitoring device traverses the esophagus, and lies in
the stomach or beyond. Worsening LEFT lower lobe infiltrate, with
air bronchograms, and consolidation, suspected pneumonia with small
effusion. Stable findings on the RIGHT.
IMPRESSION: Worsening aeration.

## 2016-08-24 IMAGING — DX DG CHEST 1V PORT
1 series · 1 of 1 positions shown · non-contrast
Comparison: None.

CLINICAL DATA: Respiratory distress

EXAM:
PORTABLE CHEST 1 VIEW

[chest ap]
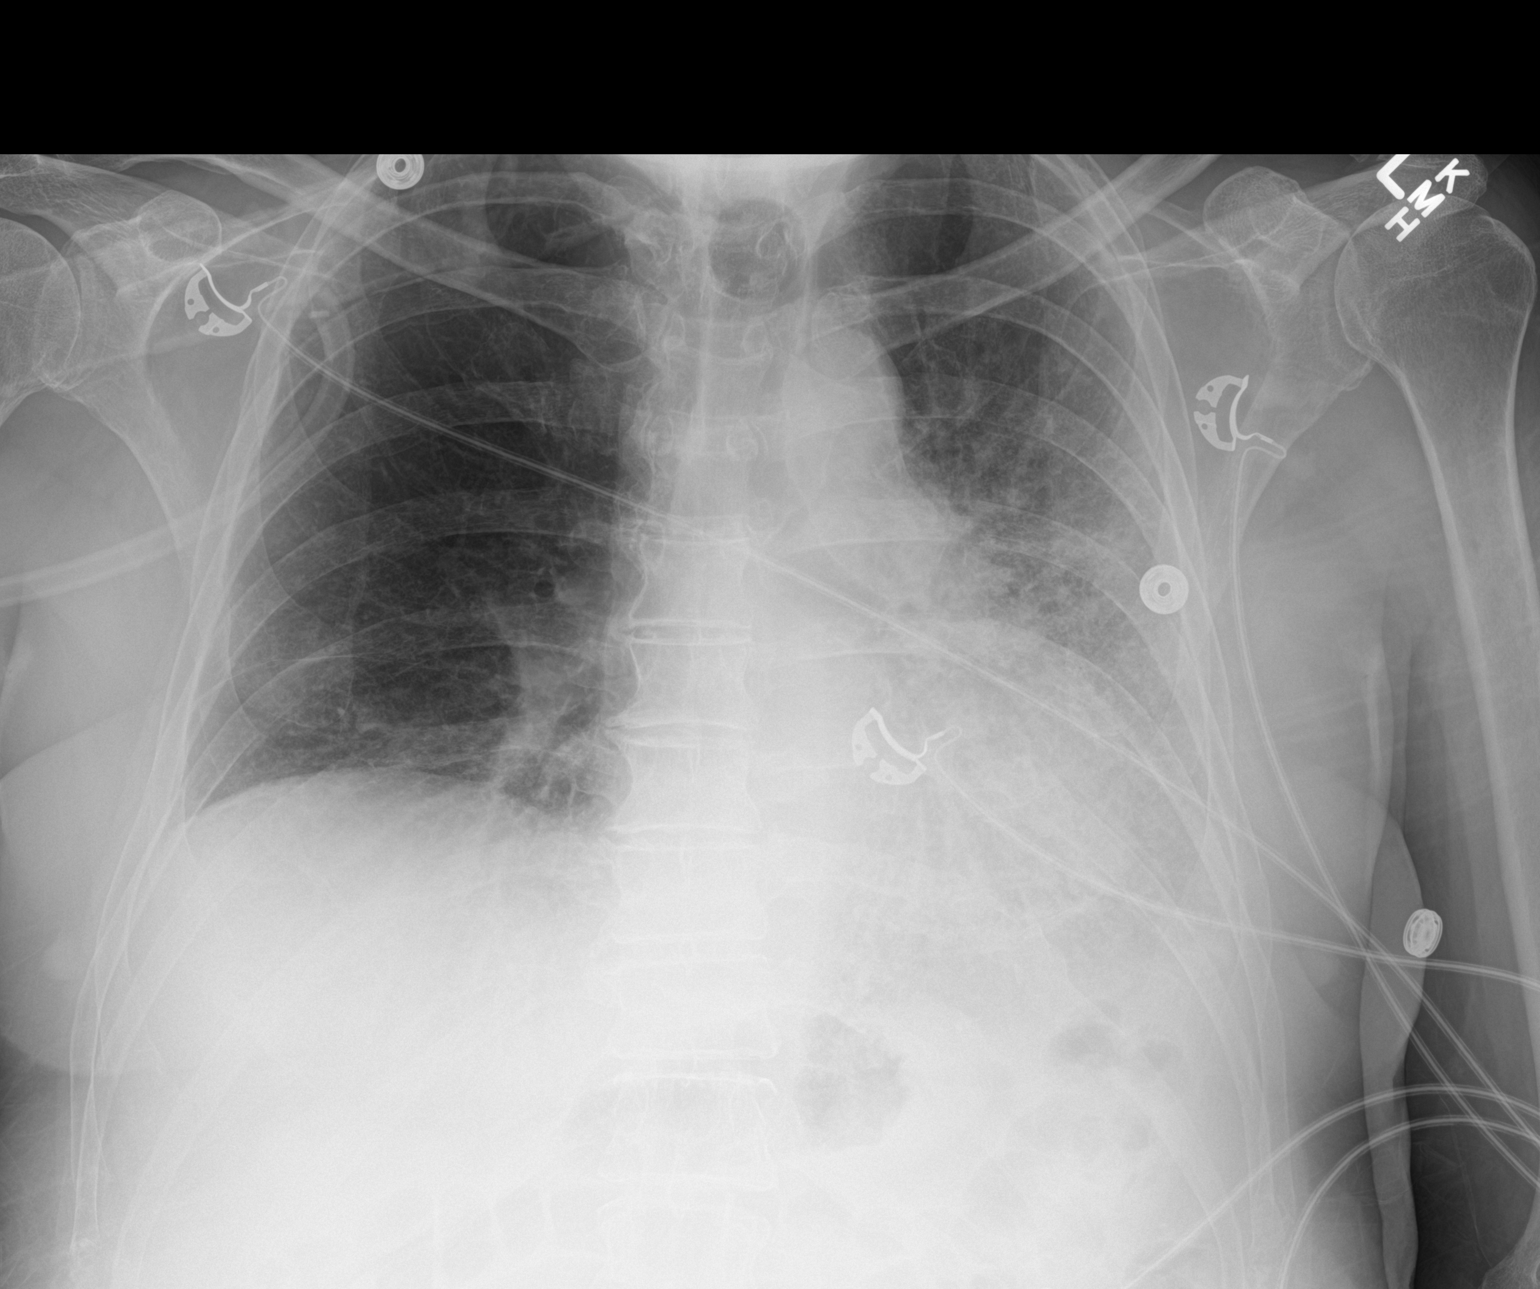

[1 of 1 positions shown; findings below may reference images not displayed]

FINDINGS: Normal heart size. Left mid and lower lung zone airspace disease has
improved. Minimal volume loss at the right base is stable. Low
volumes. No pneumothorax.
IMPRESSION: Improving airspace disease in the left mid and lower lung zones.
